# Patient Record
Sex: Female | Born: 1995 | Race: White | Hispanic: No | Marital: Married | Smoking: Never smoker
Health system: Southern US, Community
[De-identification: ages and names within clinical notes are randomized; demographics above are authoritative.]

## PROBLEM LIST (undated history)

## (undated) ENCOUNTER — Inpatient Hospital Stay (HOSPITAL_COMMUNITY): Payer: Self-pay

## (undated) VITALS — BP 114/74 | HR 121 | Temp 98.2°F | Resp 16 | Ht 68.31 in | Wt 134.5 lb

## (undated) DIAGNOSIS — G43909 Migraine, unspecified, not intractable, without status migrainosus: Secondary | ICD-10-CM

## (undated) DIAGNOSIS — L309 Dermatitis, unspecified: Secondary | ICD-10-CM

## (undated) DIAGNOSIS — E039 Hypothyroidism, unspecified: Secondary | ICD-10-CM

## (undated) DIAGNOSIS — K811 Chronic cholecystitis: Secondary | ICD-10-CM

## (undated) DIAGNOSIS — N3010 Interstitial cystitis (chronic) without hematuria: Secondary | ICD-10-CM

## (undated) DIAGNOSIS — K589 Irritable bowel syndrome without diarrhea: Secondary | ICD-10-CM

## (undated) DIAGNOSIS — K219 Gastro-esophageal reflux disease without esophagitis: Secondary | ICD-10-CM

## (undated) DIAGNOSIS — R1310 Dysphagia, unspecified: Secondary | ICD-10-CM

## (undated) DIAGNOSIS — Z98811 Dental restoration status: Secondary | ICD-10-CM

## (undated) DIAGNOSIS — J3089 Other allergic rhinitis: Secondary | ICD-10-CM

## (undated) DIAGNOSIS — R198 Other specified symptoms and signs involving the digestive system and abdomen: Secondary | ICD-10-CM

## (undated) HISTORY — DX: Migraine, unspecified, not intractable, without status migrainosus: G43.909

## (undated) HISTORY — DX: Irritable bowel syndrome, unspecified: K58.9

## (undated) HISTORY — PX: WISDOM TOOTH EXTRACTION: SHX21

## (undated) HISTORY — DX: Hypothyroidism, unspecified: E03.9

## (undated) HISTORY — DX: Dermatitis, unspecified: L30.9

---
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
## 2016-03-04 ENCOUNTER — Ambulatory Visit (INDEPENDENT_AMBULATORY_CARE_PROVIDER_SITE_OTHER): Payer: 59 | Admitting: Psychology

## 2016-03-04 DIAGNOSIS — F411 Generalized anxiety disorder: Secondary | ICD-10-CM | POA: Diagnosis not present

## 2016-03-06 ENCOUNTER — Encounter (HOSPITAL_COMMUNITY): Payer: Self-pay

## 2016-03-06 ENCOUNTER — Emergency Department (HOSPITAL_COMMUNITY)
Admission: EM | Admit: 2016-03-06 | Discharge: 2016-03-06 | Disposition: A | Discharge status: Home / Self Care | Payer: Commercial Managed Care - HMO | Attending: Emergency Medicine | Admitting: Emergency Medicine

## 2016-03-06 DIAGNOSIS — Z8742 Personal history of other diseases of the female genital tract: Secondary | ICD-10-CM | POA: Insufficient documentation

## 2016-03-06 DIAGNOSIS — J45909 Unspecified asthma, uncomplicated: Secondary | ICD-10-CM | POA: Insufficient documentation

## 2016-03-06 DIAGNOSIS — Z793 Long term (current) use of hormonal contraceptives: Secondary | ICD-10-CM | POA: Diagnosis not present

## 2016-03-06 DIAGNOSIS — R1031 Right lower quadrant pain: Secondary | ICD-10-CM | POA: Diagnosis not present

## 2016-03-06 DIAGNOSIS — G8929 Other chronic pain: Secondary | ICD-10-CM | POA: Insufficient documentation

## 2016-03-06 DIAGNOSIS — Z3202 Encounter for pregnancy test, result negative: Secondary | ICD-10-CM | POA: Insufficient documentation

## 2016-03-06 DIAGNOSIS — R109 Unspecified abdominal pain: Secondary | ICD-10-CM

## 2016-03-06 DIAGNOSIS — Z8669 Personal history of other diseases of the nervous system and sense organs: Secondary | ICD-10-CM | POA: Diagnosis not present

## 2016-03-06 DIAGNOSIS — R1011 Right upper quadrant pain: Secondary | ICD-10-CM | POA: Diagnosis present

## 2016-03-06 DIAGNOSIS — Z8659 Personal history of other mental and behavioral disorders: Secondary | ICD-10-CM | POA: Diagnosis not present

## 2016-03-06 DIAGNOSIS — Z872 Personal history of diseases of the skin and subcutaneous tissue: Secondary | ICD-10-CM | POA: Diagnosis not present

## 2016-03-06 LAB — COMPREHENSIVE METABOLIC PANEL
ALBUMIN: 3.7 g/dL (ref 3.5–5.0)
ALK PHOS: 65 U/L (ref 38–126)
ALT: 18 U/L (ref 14–54)
ANION GAP: 8 (ref 5–15)
AST: 17 U/L (ref 15–41)
BUN: 6 mg/dL (ref 6–20)
CALCIUM: 9.4 mg/dL (ref 8.9–10.3)
CHLORIDE: 108 mmol/L (ref 101–111)
CO2: 23 mmol/L (ref 22–32)
Creatinine, Ser: 0.72 mg/dL (ref 0.44–1.00)
GFR calc non Af Amer: >60 mL/min (ref >60)
GLUCOSE: 114 mg/dL — AB (ref 65–99)
Potassium: 4.1 mmol/L (ref 3.5–5.1)
SODIUM: 139 mmol/L (ref 135–145)
Total Bilirubin: 0.4 mg/dL (ref 0.3–1.2)
Total Protein: 6.5 g/dL (ref 6.5–8.1)

## 2016-03-06 LAB — CBC
HEMATOCRIT: 38.9 % (ref 36.0–46.0)
HEMOGLOBIN: 12.6 g/dL (ref 12.0–15.0)
MCH: 29.7 pg (ref 26.0–34.0)
MCHC: 32.4 g/dL (ref 30.0–36.0)
MCV: 91.7 fL (ref 78.0–100.0)
Platelets: 245 10*3/uL (ref 150–400)
RBC: 4.24 MIL/uL (ref 3.87–5.11)
RDW: 13.1 % (ref 11.5–15.5)
WBC: 5.2 10*3/uL (ref 4.0–10.5)

## 2016-03-06 LAB — URINALYSIS, ROUTINE W REFLEX MICROSCOPIC
BILIRUBIN URINE: NEGATIVE
GLUCOSE, UA: NEGATIVE mg/dL
HGB URINE DIPSTICK: NEGATIVE
Ketones, ur: NEGATIVE mg/dL
Leukocytes, UA: NEGATIVE
Nitrite: NEGATIVE
PH: 6.5 (ref 5.0–8.0)
Protein, ur: NEGATIVE mg/dL
SPECIFIC GRAVITY, URINE: 1.024 (ref 1.005–1.030)

## 2016-03-06 LAB — I-STAT BETA HCG BLOOD, ED (MC, WL, AP ONLY)

## 2016-03-06 LAB — LIPASE, BLOOD: LIPASE: 27 U/L (ref 11–51)

## 2016-03-06 MED ORDER — PANTOPRAZOLE SODIUM 40 MG IV SOLR
40.0000 mg | Freq: Once | INTRAVENOUS | Status: AC
  Administered 2016-03-06: 40 mg via INTRAVENOUS
  Filled 2016-03-06: qty 40

## 2016-03-06 MED ORDER — SODIUM CHLORIDE 0.9 % IV BOLUS (SEPSIS)
1000.0000 mL | Freq: Once | INTRAVENOUS | Status: AC
  Administered 2016-03-06: 1000 mL via INTRAVENOUS

## 2016-03-06 MED ORDER — GI COCKTAIL ~~LOC~~
30.0000 mL | Freq: Once | ORAL | Status: AC
  Administered 2016-03-06: 30 mL via ORAL
  Filled 2016-03-06: qty 30

## 2016-03-06 MED ORDER — KETOROLAC TROMETHAMINE 30 MG/ML IJ SOLN
30.0000 mg | Freq: Once | INTRAMUSCULAR | Status: AC
  Administered 2016-03-06: 30 mg via INTRAVENOUS
  Filled 2016-03-06: qty 1

## 2016-03-06 NOTE — ED Notes (Signed)
Onset past 12 hours constant pain in RUQ and across lower abd.  Pt has had this pain x 1 year, since Jan 2017 pain has been everyday, usually lasting 2 hours.  Pt has been treated for PCOS, PID, put on birth control, next will be treated for IC.  No vomiting, diarrhea, fever.  No one in household sick.

## 2016-03-06 NOTE — Discharge Instructions (Signed)
[name],  Nice meeting you! Please follow-up with your gynecologist and gastroenterologist. Return to the emergency department if your abdominal pain worsens, you are unable to keep foods down, you develop fevers/chills. Feel better soon!  S. Wendie Simmer, PA-C   Abdominal Pain, Adult Many things can cause belly (abdominal) pain. Most times, the belly pain is not dangerous. Many cases of belly pain can be watched and treated at home. HOME CARE   Do not take medicines that help you go poop (laxatives) unless told to by your doctor.  Only take medicine as told by your doctor.  Eat or drink as told by your doctor. Your doctor will tell you if you should be on a special diet. GET HELP IF:  You do not know what is causing your belly pain.  You have belly pain while you are sick to your stomach (nauseous) or have runny poop (diarrhea).  You have pain while you pee or poop.  Your belly pain wakes you up at night.  You have belly pain that gets worse or better when you eat.  You have belly pain that gets worse when you eat fatty foods.  You have a fever. GET HELP RIGHT AWAY IF:   The pain does not go away within 2 hours.  You keep throwing up (vomiting).  The pain changes and is only in the right or left part of the belly.  You have bloody or tarry looking poop. MAKE SURE YOU:   Understand these instructions.  Will watch your condition.  Will get help right away if you are not doing well or get worse.   This information is not intended to replace advice given to you by your health care provider. Make sure you discuss any questions you have with your health care provider.   Document Released: 05/16/2008 Document Revised: 12/19/2014 Document Reviewed: 08/07/2013 Elsevier Interactive Patient Education [employer].

## 2016-03-06 NOTE — ED Provider Notes (Signed)
CSN: [id number]     Arrival date & time 03/06/16  1055 History   First MD Initiated Contact with Patient 03/06/16 1206     Chief Complaint  Patient presents with  . Abdominal Pain   HPI Victoria Carson is a 20 y.o. female PMH significant for right ovarian cyst, depression, anxiety presenting with a 12 hour history of abdominal pain. She describes the pain as chronic in nature, right upper quadrant and right lower quadrant, unchanged from previous abdominal pain, 8 out of 10 pain scale, sharp, constant. She states she has been seen by her gynecologist for this pain and they have done extensive workup including evaluation for PCOS, PID and now interstitial cystitis. She was on oxycodone for pain control, and was taken off and told to take Tylenol PRN. She has tried Tylenol for this pain without relief. She denies fevers, chills, chest pain, shortness of breath, nausea, vomiting, changes in bowel or bladder habits, vaginal or urinary complaints, ill contacts, recent antibiotic use, recent travel.  Past Medical History  Diagnosis Date  . H/O guttate psoriasis     associated with strep infection  . Allergy   . RAD (reactive airway disease)   . Ovarian cyst, right   . Depression   . Vision abnormalities   . Anxiety   . [id number])    Past Surgical History  Procedure Laterality Date  . Wisdom tooth extraction     Family History  Problem Relation Age of Onset  . Heart failure Other   . Hypertension Other   . Depression Paternal Aunt   . Anxiety disorder Paternal Aunt   . Mental illness Mother   . Drug abuse Mother    Social History  Substance Use Topics  . Smoking status: Passive Smoke Exposure - Never Smoker  . Smokeless tobacco: Never Used  . Alcohol Use: No   OB History    No data available     Review of Systems  Ten systems are reviewed and are negative for acute change except as noted in the HPI  Allergies  Citrus and Sulfa antibiotics  Home Medications   Prior  to Admission medications   Medication Sig Start Date End Date Taking? Authorizing Provider  cyclobenzaprine (FLEXERIL) 5 MG tablet Take 1 tablet (5 mg total) by mouth 3 (three) times daily as needed for muscle spasms. Patient not taking: Reported on 08/24/2015 07/26/15   Roselee Culver, MD  etonogestrel-ethinyl estradiol (NUVARING) 0.12-0.015 MG/24HR vaginal ring Place 1 each vaginally every 28 (twenty-eight) days. Insert vaginally and leave in place for 3 consecutive weeks, then remove for 1 week.    Historical Provider, MD  HYDROcodone-acetaminophen (NORCO) 5-325 MG per tablet Take 1 tablet by mouth every 6 (six) hours as needed for severe pain. Patient not taking: Reported on 07/26/2015 02/25/15   Mercedes Camprubi-Soms, PA-C  HYDROcodone-acetaminophen (NORCO/VICODIN) 5-325 MG per tablet Take 1-2 tablets by mouth every 6 (six) hours as needed. 08/26/15   Jennifer Piepenbrink, PA-C  ibuprofen (ADVIL,MOTRIN) 800 MG tablet Take 1 tablet (800 mg total) by mouth every 8 (eight) hours as needed for mild pain. Patient not taking: Reported on 08/25/2015 08/25/15   Delice Bison Ward, DO  naproxen sodium (ANAPROX DS) 550 MG tablet Take 1 tablet (550 mg total) by mouth 2 (two) times daily with a meal. Patient not taking: Reported on 08/24/2015 07/26/15 07/25/16  Roselee Culver, MD  ondansetron (ZOFRAN ODT) 4 MG disintegrating tablet Take 1 tablet (4 mg total) by mouth  every 8 (eight) hours as needed for nausea or vomiting. Patient not taking: Reported on 08/25/2015 08/25/15   Kristen N Ward, DO  ondansetron (ZOFRAN ODT) 4 MG disintegrating tablet Take 1 tablet (4 mg total) by mouth every 8 (eight) hours as needed for nausea or vomiting. 08/26/15   Jennifer Piepenbrink, PA-C  triamcinolone (NASACORT AQ) 55 MCG/ACT AERO nasal inhaler Place 2 sprays into the nose daily. Each nostril Patient not taking: Reported on 08/24/2015 07/26/15   Roselee Culver, MD   BP 109/62 mmHg  Pulse 64  Temp(Src) 98 F (36.7 C)  (Oral)  Resp 16  Ht 5\' 9"  (1.753 m)  Wt 61.508 kg  BMI 20.02 kg/m2  SpO2 100%  LMP 02/07/2016 Physical Exam  Constitutional: She appears well-developed and well-nourished. No distress.  Thin appearing  HENT:  Head: Normocephalic and atraumatic.  Mouth/Throat: Oropharynx is clear and moist. No oropharyngeal exudate.  Eyes: Conjunctivae are normal. Pupils are equal, round, and reactive to light. Right eye exhibits no discharge. Left eye exhibits no discharge. No scleral icterus.  Neck: No tracheal deviation present.  Cardiovascular: Normal rate, regular rhythm, normal heart sounds and intact distal pulses.  Exam reveals no gallop and no friction rub.   No murmur heard. Pulmonary/Chest: Effort normal and breath sounds normal. No respiratory distress. She has no wheezes. She has no rales. She exhibits no tenderness.  Abdominal: Soft. Bowel sounds are normal. She exhibits no distension and no mass. There is tenderness. There is no rebound and no guarding.  Mild right upper and right lower quadrant tenderness. Negative McBurney, Psoas, Rovsing.  Musculoskeletal: She exhibits no edema.  Lymphadenopathy:    She has no cervical adenopathy.  Neurological: She is alert. Coordination normal.  Skin: Skin is warm and dry. No rash noted. She is not diaphoretic. No erythema.  Psychiatric: She has a normal mood and affect. Her behavior is normal.  Nursing note and vitals reviewed.   ED Course  Procedures  Labs Review Labs Reviewed  CBC  LIPASE, BLOOD  COMPREHENSIVE METABOLIC PANEL  URINALYSIS, ROUTINE W REFLEX MICROSCOPIC (NOT AT [facility])  I-STAT BETA HCG BLOOD, ED ([facility], [facility], [facility] ONLY)   MDM   Final diagnoses:  Abdominal pain, unspecified abdominal location   Patient nontoxic-appearing, vital signs stable. HCG, UA, lipase, CBC, CMP unremarkable. Will give GI cocktail and Protonix. This is a chronic issue. Patient has had extensive workup through her gynecologist. CBC, hcg, CMP, lipase, UA  unremarkable. Patient requesting gastroenterology referral for another opinion.  Patient may be safely discharged home. Discussed reasons for return. Patient to follow-up with gynecology within one week. Patient in understanding and agreement with the plan.  Silerton Lions, PA-C 03/10/16 [place], MD 03/12/16 [phone]

## 2016-03-18 ENCOUNTER — Ambulatory Visit: Payer: 59 | Admitting: Psychology

## 2016-04-13 ENCOUNTER — Ambulatory Visit (INDEPENDENT_AMBULATORY_CARE_PROVIDER_SITE_OTHER): Payer: 59 | Admitting: Psychology

## 2016-04-13 DIAGNOSIS — F411 Generalized anxiety disorder: Secondary | ICD-10-CM

## 2016-04-14 ENCOUNTER — Other Ambulatory Visit (HOSPITAL_COMMUNITY): Payer: Self-pay | Admitting: Gastroenterology

## 2016-04-14 DIAGNOSIS — R1011 Right upper quadrant pain: Secondary | ICD-10-CM

## 2016-04-14 DIAGNOSIS — R1013 Epigastric pain: Secondary | ICD-10-CM

## 2016-04-20 ENCOUNTER — Ambulatory Visit (HOSPITAL_COMMUNITY)
Admission: RE | Admit: 2016-04-20 | Discharge: 2016-04-20 | Disposition: A | Discharge status: Home / Self Care | Payer: Commercial Managed Care - HMO | Source: Ambulatory Visit | Attending: Gastroenterology | Admitting: Gastroenterology

## 2016-04-20 DIAGNOSIS — R1013 Epigastric pain: Secondary | ICD-10-CM | POA: Diagnosis present

## 2016-04-20 DIAGNOSIS — R1011 Right upper quadrant pain: Secondary | ICD-10-CM | POA: Insufficient documentation

## 2016-04-20 MED ORDER — TECHNETIUM TC 99M MEBROFENIN IV KIT
5.2000 | PACK | Freq: Once | INTRAVENOUS | Status: AC | PRN
  Administered 2016-04-20: 5 via INTRAVENOUS

## 2016-05-03 ENCOUNTER — Ambulatory Visit (INDEPENDENT_AMBULATORY_CARE_PROVIDER_SITE_OTHER): Payer: 59 | Admitting: Psychology

## 2016-05-03 DIAGNOSIS — F411 Generalized anxiety disorder: Secondary | ICD-10-CM

## 2016-05-18 ENCOUNTER — Ambulatory Visit (INDEPENDENT_AMBULATORY_CARE_PROVIDER_SITE_OTHER): Payer: 59 | Admitting: Psychology

## 2016-05-18 DIAGNOSIS — F411 Generalized anxiety disorder: Secondary | ICD-10-CM

## 2016-06-02 ENCOUNTER — Ambulatory Visit (INDEPENDENT_AMBULATORY_CARE_PROVIDER_SITE_OTHER): Payer: 59 | Admitting: Psychology

## 2016-06-02 DIAGNOSIS — F410 Panic disorder [episodic paroxysmal anxiety] without agoraphobia: Secondary | ICD-10-CM | POA: Diagnosis not present

## 2016-06-02 DIAGNOSIS — F332 Major depressive disorder, recurrent severe without psychotic features: Secondary | ICD-10-CM

## 2016-06-21 ENCOUNTER — Ambulatory Visit (INDEPENDENT_AMBULATORY_CARE_PROVIDER_SITE_OTHER): Payer: 59 | Admitting: Psychology

## 2016-06-21 DIAGNOSIS — F411 Generalized anxiety disorder: Secondary | ICD-10-CM

## 2016-06-27 ENCOUNTER — Other Ambulatory Visit: Payer: Self-pay | Admitting: Surgery

## 2016-06-30 ENCOUNTER — Ambulatory Visit (INDEPENDENT_AMBULATORY_CARE_PROVIDER_SITE_OTHER): Payer: 59 | Admitting: Psychology

## 2016-06-30 DIAGNOSIS — F410 Panic disorder [episodic paroxysmal anxiety] without agoraphobia: Secondary | ICD-10-CM | POA: Diagnosis not present

## 2016-07-12 DIAGNOSIS — K811 Chronic cholecystitis: Secondary | ICD-10-CM

## 2016-07-12 HISTORY — DX: Chronic cholecystitis: K81.1

## 2016-08-01 ENCOUNTER — Encounter (HOSPITAL_BASED_OUTPATIENT_CLINIC_OR_DEPARTMENT_OTHER): Payer: Self-pay | Admitting: *Deleted

## 2016-08-03 ENCOUNTER — Ambulatory Visit (INDEPENDENT_AMBULATORY_CARE_PROVIDER_SITE_OTHER): Payer: Commercial Managed Care - HMO | Admitting: Psychology

## 2016-08-03 DIAGNOSIS — F411 Generalized anxiety disorder: Secondary | ICD-10-CM

## 2016-08-07 NOTE — H&P (Signed)
Victoria Carson  Location: [facility] Patient #: [id number] DOB: September 15, 1996 Single / Language: Cleophus Molt / Race: White Female   History of Present Illness (Victoria [place] A. Ninfa Linden MD;  Patient words: Gallbladder.  The patient is a 20 year old female who presents with abdominal pain. This is a pleasant 20 year old female referred to me by Dr. Arta Silence for right upper quadrant abdominal pain. This is been going on since September 2016. She started having right upper quadrant abdominal pain hurting through to the back with nausea and bloating after fatty meals. This is now occurring daily. She has had a normal upper endoscopy. She has had a normal CT scan. Ultrasound showed a contracted gallbladder but no stones. HIDA scan showed a normal gallbladder ejection fraction. She has had no previous similar history. Bowel movements are normal. She is otherwise healthy and without complaints. The pain is described as moderate to severe.   Other Problems  Anxiety Disorder Depression Gastroesophageal Reflux Disease  Past Surgical History Malachi Bonds, CMA Oral Surgery  Diagnostic Studies History Malachi Bonds, CMA;  Colonoscopy never Mammogram never Pap Smear 1-5 years ago  Allergies Malachi Bonds, CMA;  No Known Drug Allergies07/17/2017  Medication History Malachi Bonds, CMA; NuvaRing (0.12-0.015MG /24HR Ring, Vaginal) Active. Sucralfate (1GM Tablet, Oral) Active. Tinidazole (500MG  Tablet, Oral) Active. Uro-MP (118MG  Capsule, Oral) Active. Medications Reconciled  Social History Malachi Bonds, CMA;  AM) Caffeine use Coffee. No alcohol use No drug use Tobacco use Never smoker.  Family History Malachi Bonds, CMA;  Bleeding disorder Mother. Depression Brother, Mother, Sister.  Pregnancy / Birth History Malachi Bonds, CMA;  Age at menarche 61 years. Gravida 0 Para 0 Regular periods    Review of Systems   General Present-  Appetite Loss, Fatigue and Weight Loss. Not Present- Chills, Fever, Night Sweats and Weight Gain. HEENT Present- Seasonal Allergies and Wears glasses/contact lenses. Not Present- Earache, Hearing Loss, Hoarseness, Nose Bleed, Oral Ulcers, Ringing in the Ears, Sinus Pain, Sore Throat, Visual Disturbances and Yellow Eyes. Gastrointestinal Present- Abdominal Pain, Constipation, Gets full quickly at meals and Nausea. Not Present- Bloating, Bloody Stool, Change in Bowel Habits, Chronic diarrhea, Difficulty Swallowing, Excessive gas, Hemorrhoids, Indigestion, Rectal Pain and Vomiting. Female Genitourinary Present- Pelvic Pain and Urgency. Not Present- Frequency, Nocturia and Painful Urination. Neurological Present- Headaches. Not Present- Decreased Memory, Fainting, Numbness, Seizures, Tingling, Tremor, Trouble walking and Weakness. Psychiatric Present- Anxiety and Change in Sleep Pattern. Not Present- Bipolar, Depression, Fearful and Frequent crying.  Vitals   Weight: 133.3 lb (62nd percentile) Height: 69in (97th percentile) Body Surface Area: 1.74 m Body Mass Index: 19.68 kg/m  (25th percentile)  Temp.: 98.39F  BP: 102/54 (Sitting, Left Arm, Standard)  Percentiles calculated using CDC data for children 2-20 years.     Physical Exam   General Mental Status-Alert. General Appearance-Consistent with stated age. Hydration-Well hydrated. Voice-Normal.  Head and Neck Head-normocephalic, atraumatic with no lesions or palpable masses.  Eye Eyeball - Bilateral-Extraocular movements intact. Sclera/Conjunctiva - Bilateral-No scleral icterus.  Chest and Lung Exam Chest and lung exam reveals -quiet, even and easy respiratory effort with no use of accessory muscles and on auscultation, normal breath sounds, no adventitious sounds and normal vocal resonance. Inspection Chest Wall - Normal. Back - normal.  Cardiovascular Cardiovascular examination reveals -on  palpation PMI is normal in location and amplitude, no palpable S3 or S4. Normal cardiac borders., normal heart sounds, regular rate and rhythm with no murmurs, carotid auscultation reveals no bruits and normal pedal pulses bilaterally.  Abdomen  Inspection Inspection of the abdomen reveals - No Hernias. Skin - Scar - no surgical scars. Palpation/Percussion Palpation and Percussion of the abdomen reveal - Soft, Non Tender, No Rebound tenderness, No Rigidity (guarding) and No hepatosplenomegaly. Auscultation Auscultation of the abdomen reveals - Bowel sounds normal.  Neurologic - Did not examine.  Musculoskeletal - Did not examine.    Assessment & Plan    CHRONIC CHOLECYSTITIS (K81.1) Impression: Based on her symptoms and ultrasound, I believe she has chronic cholecystitis and would benefit from a laparoscopic cholecystectomy. I discussed this with the patient and her father in detail. I gave him literature regarding cholecystectomy. I discussed the risks of surgery which includes but is not limited to bleeding, infection, the need to convert to an open procedure, injury to surrounding structures, bile duct injury, bile leak, the chance this may not resolve any of her symptoms, postoperative recovery, etc. I also discussed continued conservative management. After discussion, they wish to proceed with cholecystectomy which will be scheduled Current Plans Pt Education - Pamphlet Given - Laparoscopic Gallbladder Surgery: discussed with patient and provided information.

## 2016-08-08 ENCOUNTER — Encounter (HOSPITAL_BASED_OUTPATIENT_CLINIC_OR_DEPARTMENT_OTHER)
Admission: RE | Disposition: A | Discharge status: Home / Self Care | Payer: Self-pay | Source: Ambulatory Visit | Attending: Surgery

## 2016-08-08 ENCOUNTER — Ambulatory Visit (HOSPITAL_BASED_OUTPATIENT_CLINIC_OR_DEPARTMENT_OTHER): Payer: Commercial Managed Care - HMO | Admitting: Anesthesiology

## 2016-08-08 ENCOUNTER — Ambulatory Visit (HOSPITAL_BASED_OUTPATIENT_CLINIC_OR_DEPARTMENT_OTHER)
Admission: RE | Admit: 2016-08-08 | Discharge: 2016-08-08 | Disposition: A | Discharge status: Home / Self Care | Payer: Commercial Managed Care - HMO | Source: Ambulatory Visit | Attending: Surgery | Admitting: Surgery

## 2016-08-08 ENCOUNTER — Encounter (HOSPITAL_BASED_OUTPATIENT_CLINIC_OR_DEPARTMENT_OTHER): Payer: Self-pay

## 2016-08-08 DIAGNOSIS — K811 Chronic cholecystitis: Secondary | ICD-10-CM | POA: Insufficient documentation

## 2016-08-08 DIAGNOSIS — Z793 Long term (current) use of hormonal contraceptives: Secondary | ICD-10-CM | POA: Diagnosis not present

## 2016-08-08 HISTORY — DX: Other specified symptoms and signs involving the digestive system and abdomen: R19.8

## 2016-08-08 HISTORY — PX: CHOLECYSTECTOMY: SHX55

## 2016-08-08 HISTORY — DX: Chronic cholecystitis: K81.1

## 2016-08-08 HISTORY — DX: Gastro-esophageal reflux disease without esophagitis: K21.9

## 2016-08-08 HISTORY — DX: Dysphagia, unspecified: R13.10

## 2016-08-08 HISTORY — DX: Dental restoration status: Z98.811

## 2016-08-08 HISTORY — DX: Other allergic rhinitis: J30.89

## 2016-08-08 SURGERY — LAPAROSCOPIC CHOLECYSTECTOMY
Anesthesia: General | Site: Abdomen

## 2016-08-08 MED ORDER — GLYCOPYRROLATE 0.2 MG/ML IJ SOLN
0.2000 mg | Freq: Once | INTRAMUSCULAR | Status: AC | PRN
  Administered 2016-08-08: 0.2 mg via INTRAVENOUS

## 2016-08-08 MED ORDER — SUCCINYLCHOLINE CHLORIDE 20 MG/ML IJ SOLN
INTRAMUSCULAR | Status: DC | PRN
  Administered 2016-08-08: 50 mg via INTRAVENOUS

## 2016-08-08 MED ORDER — FENTANYL CITRATE (PF) 100 MCG/2ML IJ SOLN
INTRAMUSCULAR | Status: AC
  Filled 2016-08-08: qty 2

## 2016-08-08 MED ORDER — DIPHENHYDRAMINE HCL 50 MG/ML IJ SOLN
INTRAMUSCULAR | Status: DC | PRN
  Administered 2016-08-08 (×2): 25 mg via INTRAVENOUS

## 2016-08-08 MED ORDER — OXYCODONE HCL 5 MG PO TABS: 5.0000 mg | ORAL_TABLET | ORAL | Status: DC | PRN

## 2016-08-08 MED ORDER — CHLORHEXIDINE GLUCONATE CLOTH 2 % EX PADS: 6.0000 | MEDICATED_PAD | Freq: Once | CUTANEOUS | Status: DC

## 2016-08-08 MED ORDER — KETOROLAC TROMETHAMINE 30 MG/ML IJ SOLN
INTRAMUSCULAR | Status: AC
  Filled 2016-08-08: qty 1

## 2016-08-08 MED ORDER — BUPIVACAINE-EPINEPHRINE (PF) 0.5% -1:200000 IJ SOLN
INTRAMUSCULAR | Status: AC
  Filled 2016-08-08: qty 30

## 2016-08-08 MED ORDER — MORPHINE SULFATE (PF) 2 MG/ML IV SOLN: 1.0000 mg | INTRAVENOUS | Status: DC | PRN

## 2016-08-08 MED ORDER — SUCCINYLCHOLINE CHLORIDE 200 MG/10ML IV SOSY
PREFILLED_SYRINGE | INTRAVENOUS | Status: AC
  Filled 2016-08-08: qty 10

## 2016-08-08 MED ORDER — SODIUM CHLORIDE 0.9 % IV SOLN: 250.0000 mL | INTRAVENOUS | Status: DC | PRN

## 2016-08-08 MED ORDER — LIDOCAINE HCL (CARDIAC) 20 MG/ML IV SOLN
INTRAVENOUS | Status: DC | PRN
  Administered 2016-08-08: 50 mg via INTRAVENOUS

## 2016-08-08 MED ORDER — OXYCODONE HCL 5 MG/5ML PO SOLN
ORAL | Status: AC
  Filled 2016-08-08: qty 5

## 2016-08-08 MED ORDER — ACETAMINOPHEN 650 MG RE SUPP: 650.0000 mg | RECTAL | Status: DC | PRN

## 2016-08-08 MED ORDER — FENTANYL CITRATE (PF) 100 MCG/2ML IJ SOLN
INTRAMUSCULAR | Status: DC | PRN
  Administered 2016-08-08: 50 ug via INTRAVENOUS
  Administered 2016-08-08: 100 ug via INTRAVENOUS
  Administered 2016-08-08: 50 ug via INTRAVENOUS

## 2016-08-08 MED ORDER — HYDROMORPHONE HCL 1 MG/ML IJ SOLN
INTRAMUSCULAR | Status: AC
  Filled 2016-08-08: qty 1

## 2016-08-08 MED ORDER — PROPOFOL 500 MG/50ML IV EMUL
INTRAVENOUS | Status: AC
  Filled 2016-08-08: qty 50

## 2016-08-08 MED ORDER — LIDOCAINE 2% (20 MG/ML) 5 ML SYRINGE
INTRAMUSCULAR | Status: AC
  Filled 2016-08-08: qty 5

## 2016-08-08 MED ORDER — DEXAMETHASONE SODIUM PHOSPHATE 4 MG/ML IJ SOLN
INTRAMUSCULAR | Status: DC | PRN
  Administered 2016-08-08: 10 mg via INTRAVENOUS

## 2016-08-08 MED ORDER — ACETAMINOPHEN 325 MG PO TABS: 325.0000 mg | ORAL_TABLET | ORAL | Status: DC | PRN

## 2016-08-08 MED ORDER — OXYCODONE HCL 5 MG PO TABS
ORAL_TABLET | ORAL | Status: AC
  Filled 2016-08-08: qty 1

## 2016-08-08 MED ORDER — SODIUM CHLORIDE 0.9 % IR SOLN
Status: DC | PRN
  Administered 2016-08-08: 1000 mL

## 2016-08-08 MED ORDER — SODIUM CHLORIDE 0.9% FLUSH: 3.0000 mL | INTRAVENOUS | Status: DC | PRN

## 2016-08-08 MED ORDER — FENTANYL CITRATE (PF) 100 MCG/2ML IJ SOLN
25.0000 ug | INTRAMUSCULAR | Status: DC | PRN
  Administered 2016-08-08: 25 ug via INTRAVENOUS
  Administered 2016-08-08: 50 ug via INTRAVENOUS

## 2016-08-08 MED ORDER — KETOROLAC TROMETHAMINE 30 MG/ML IJ SOLN
INTRAMUSCULAR | Status: DC | PRN
  Administered 2016-08-08: 30 mg via INTRAVENOUS

## 2016-08-08 MED ORDER — OXYCODONE HCL 5 MG PO TABS: 5.0000 mg | ORAL_TABLET | Freq: Once | ORAL | Status: AC | PRN

## 2016-08-08 MED ORDER — BUPIVACAINE-EPINEPHRINE (PF) 0.5% -1:200000 IJ SOLN
INTRAMUSCULAR | Status: DC | PRN
  Administered 2016-08-08: 20 mL via PERINEURAL

## 2016-08-08 MED ORDER — HYDROMORPHONE HCL 1 MG/ML IJ SOLN
0.5000 mg | INTRAMUSCULAR | Status: DC | PRN
  Administered 2016-08-08: 0.25 mg via INTRAVENOUS

## 2016-08-08 MED ORDER — ONDANSETRON HCL 4 MG/2ML IJ SOLN
INTRAMUSCULAR | Status: AC
  Filled 2016-08-08: qty 2

## 2016-08-08 MED ORDER — MIDAZOLAM HCL 2 MG/2ML IJ SOLN: 1.0000 mg | INTRAMUSCULAR | Status: DC | PRN

## 2016-08-08 MED ORDER — CEFAZOLIN SODIUM-DEXTROSE 2-4 GM/100ML-% IV SOLN
2.0000 g | INTRAVENOUS | Status: AC
  Administered 2016-08-08: 2 g via INTRAVENOUS

## 2016-08-08 MED ORDER — HYDROCODONE-ACETAMINOPHEN 7.5-325 MG/15ML PO SOLN: 15.0000 mL | ORAL | 0 refills | Status: AC | PRN

## 2016-08-08 MED ORDER — DEXAMETHASONE SODIUM PHOSPHATE 10 MG/ML IJ SOLN
INTRAMUSCULAR | Status: AC
  Filled 2016-08-08: qty 1

## 2016-08-08 MED ORDER — OXYCODONE HCL 5 MG/5ML PO SOLN
5.0000 mg | Freq: Once | ORAL | Status: AC | PRN
  Administered 2016-08-08: 5 mg via ORAL

## 2016-08-08 MED ORDER — FENTANYL CITRATE (PF) 100 MCG/2ML IJ SOLN: 50.0000 ug | INTRAMUSCULAR | Status: DC | PRN

## 2016-08-08 MED ORDER — MIDAZOLAM HCL 2 MG/2ML IJ SOLN
INTRAMUSCULAR | Status: AC
  Filled 2016-08-08: qty 2

## 2016-08-08 MED ORDER — MIDAZOLAM HCL 5 MG/5ML IJ SOLN
INTRAMUSCULAR | Status: DC | PRN
  Administered 2016-08-08: 2 mg via INTRAVENOUS

## 2016-08-08 MED ORDER — SCOPOLAMINE 1 MG/3DAYS TD PT72: 1.0000 | MEDICATED_PATCH | Freq: Once | TRANSDERMAL | Status: DC | PRN

## 2016-08-08 MED ORDER — ACETAMINOPHEN 325 MG PO TABS: 650.0000 mg | ORAL_TABLET | ORAL | Status: DC | PRN

## 2016-08-08 MED ORDER — CEFAZOLIN SODIUM-DEXTROSE 2-4 GM/100ML-% IV SOLN
INTRAVENOUS | Status: AC
  Filled 2016-08-08: qty 100

## 2016-08-08 MED ORDER — ONDANSETRON HCL 4 MG/2ML IJ SOLN
INTRAMUSCULAR | Status: DC | PRN
  Administered 2016-08-08: 4 mg via INTRAVENOUS

## 2016-08-08 MED ORDER — LACTATED RINGERS IV SOLN
INTRAVENOUS | Status: DC
  Administered 2016-08-08 (×2): via INTRAVENOUS

## 2016-08-08 MED ORDER — ACETAMINOPHEN 160 MG/5ML PO SOLN: 325.0000 mg | ORAL | Status: DC | PRN

## 2016-08-08 MED ORDER — SODIUM CHLORIDE 0.9% FLUSH: 3.0000 mL | Freq: Two times a day (BID) | INTRAVENOUS | Status: DC

## 2016-08-08 MED ORDER — DIPHENHYDRAMINE HCL 50 MG/ML IJ SOLN
INTRAMUSCULAR | Status: AC
  Filled 2016-08-08: qty 1

## 2016-08-08 SURGICAL SUPPLY — 32 items
APPLIER CLIP 5 13 M/L LIGAMAX5 (MISCELLANEOUS) ×2
BLADE CLIPPER SURG (BLADE) IMPLANT
CHLORAPREP W/TINT 26ML (MISCELLANEOUS) ×2 IMPLANT
CLIP APPLIE 5 13 M/L LIGAMAX5 (MISCELLANEOUS) ×1 IMPLANT
COVER MAYO STAND STRL (DRAPES) IMPLANT
DECANTER SPIKE VIAL GLASS SM (MISCELLANEOUS) ×2 IMPLANT
DERMABOND ADVANCED (GAUZE/BANDAGES/DRESSINGS) ×1
DERMABOND ADVANCED .7 DNX12 (GAUZE/BANDAGES/DRESSINGS) ×1 IMPLANT
DRAPE C-ARM 42X72 X-RAY (DRAPES) IMPLANT
ELECT REM PT RETURN 9FT ADLT (ELECTROSURGICAL) ×2
ELECTRODE REM PT RTRN 9FT ADLT (ELECTROSURGICAL) ×1 IMPLANT
FILTER SMOKE EVAC LAPAROSHD (FILTER) IMPLANT
GLOVE SURG SIGNA 7.5 PF LTX (GLOVE) ×2 IMPLANT
GOWN STRL REUS W/ TWL LRG LVL3 (GOWN DISPOSABLE) ×3 IMPLANT
GOWN STRL REUS W/TWL LRG LVL3 (GOWN DISPOSABLE) ×3
LIQUID BAND (GAUZE/BANDAGES/DRESSINGS) ×4 IMPLANT
NS IRRIG 1000ML POUR BTL (IV SOLUTION) ×2 IMPLANT
PACK BASIN DAY SURGERY FS (CUSTOM PROCEDURE TRAY) ×2 IMPLANT
POUCH SPECIMEN RETRIEVAL 10MM (ENDOMECHANICALS) ×2 IMPLANT
SCISSORS LAP 5X35 DISP (ENDOMECHANICALS) IMPLANT
SET CHOLANGIOGRAPH 5 50 .035 (SET/KITS/TRAYS/PACK) IMPLANT
SET IRRIG TUBING LAPAROSCOPIC (IRRIGATION / IRRIGATOR) ×2 IMPLANT
SLEEVE ENDOPATH XCEL 5M (ENDOMECHANICALS) ×4 IMPLANT
SLEEVE SCD COMPRESS KNEE MED (MISCELLANEOUS) ×2 IMPLANT
SPECIMEN JAR SMALL (MISCELLANEOUS) ×2 IMPLANT
SUT MON AB 4-0 PC3 18 (SUTURE) ×2 IMPLANT
TOWEL OR 17X24 6PK STRL BLUE (TOWEL DISPOSABLE) ×2 IMPLANT
TRAY LAPAROSCOPIC (CUSTOM PROCEDURE TRAY) ×2 IMPLANT
TROCAR XCEL BLUNT TIP 100MML (ENDOMECHANICALS) ×2 IMPLANT
TROCAR XCEL NON-BLD 5MMX100MML (ENDOMECHANICALS) ×2 IMPLANT
TUBE CONNECTING 20X1/4 (TUBING) ×4 IMPLANT
TUBING INSUFFLATION (TUBING) ×2 IMPLANT

## 2016-08-08 NOTE — Op Note (Signed)

## 2016-08-08 NOTE — Anesthesia Postprocedure Evaluation (Signed)
Anesthesia Post Note  Patient: Victoria Carson  Procedure(s) Performed: Procedure(s) (LRB): LAPAROSCOPIC CHOLECYSTECTOMY (N/A)  Patient location during evaluation: PACU Anesthesia Type: General Level of consciousness: awake Pain management: pain level controlled Vital Signs Assessment: post-procedure vital signs reviewed and stable Respiratory status: spontaneous breathing Cardiovascular status: stable Postop Assessment: no signs of nausea or vomiting Anesthetic complications: no    Last Vitals:  Vitals:   08/08/16 0936 08/08/16 0945  BP:    Pulse:    Resp: 16 16  Temp: 36.5 C 36.5 C    Last Pain:  Vitals:   08/08/16 0945  TempSrc:   PainSc: 4                  [employer]

## 2016-08-08 NOTE — Transfer of Care (Signed)
Immediate Anesthesia Transfer of Care Note  Patient: Victoria Carson  Procedure(s) Performed: Procedure(s): LAPAROSCOPIC CHOLECYSTECTOMY (N/A)  Patient Location: PACU  Anesthesia Type:General  Level of Consciousness: awake and patient cooperative  Airway & Oxygen Therapy: Patient Spontanous Breathing and Patient connected to face mask oxygen  Post-op Assessment: Report given to RN and Post -op Vital signs reviewed and stable  Post vital signs: Reviewed and stable  Last Vitals:  Vitals:   08/08/16 0625  BP: 117/81  Pulse: 100  Resp: 20  Temp: 36.9 C    Last Pain:  Vitals:   08/08/16 0625  TempSrc: Oral         Complications: No apparent anesthesia complications

## 2016-08-08 NOTE — Anesthesia Preprocedure Evaluation (Signed)
Anesthesia Evaluation  Patient identified by MRN, date of birth, ID band Patient awake    Reviewed: Allergy & Precautions, NPO status , Patient's Chart, lab work & pertinent test results  History of Anesthesia Complications Negative for: history of anesthetic complications  Airway Mallampati: I  TM Distance: >3 FB Neck ROM: Full    Dental  (+) Teeth Intact,    Pulmonary neg pulmonary ROS,    breath sounds clear to auscultation       Cardiovascular negative cardio ROS   Rhythm:Regular     Neuro/Psych PSYCHIATRIC DISORDERS Depression negative neurological ROS     GI/Hepatic Neg liver ROS, GERD  Controlled,  Endo/Other  negative endocrine ROS  Renal/GU negative Renal ROS     Musculoskeletal   Abdominal   Peds  Hematology negative hematology ROS (+)   Anesthesia Other Findings   Reproductive/Obstetrics                             Anesthesia Physical Anesthesia Plan  ASA: I  Anesthesia Plan: General   Post-op Pain Management:    Induction: Intravenous  Airway Management Planned: Oral ETT  Additional Equipment: None  Intra-op Plan:   Post-operative Plan: Extubation in OR  Informed Consent: I have reviewed the patients History and Physical, chart, labs and discussed the procedure including the risks, benefits and alternatives for the proposed anesthesia with the patient or authorized representative who has indicated his/her understanding and acceptance.   Dental advisory given  Plan Discussed with: CRNA and Surgeon  Anesthesia Plan Comments:         Anesthesia Quick Evaluation

## 2016-08-08 NOTE — Anesthesia Procedure Notes (Signed)
Procedure Name: Intubation Date/Time: 08/08/2016 7:30 AM Performed by: Marrianne Mood Pre-anesthesia Checklist: Patient identified, Emergency Drugs available, Suction available, Patient being monitored and Timeout performed Patient Re-evaluated:Patient Re-evaluated prior to inductionOxygen Delivery Method: Circle system utilized Preoxygenation: Pre-oxygenation with 100% oxygen Intubation Type: IV induction Ventilation: Mask ventilation without difficulty Grade View: Grade II Tube type: Oral Tube size: 7.0 mm Number of attempts: 1 Airway Equipment and Method: Stylet and Oral airway Placement Confirmation: ETT inserted through vocal cords under direct vision,  positive ETCO2 and breath sounds checked- equal and bilateral Secured at: 21 cm Tube secured with: Tape Dental Injury: Teeth and Oropharynx as per pre-operative assessment

## 2016-08-08 NOTE — Interval H&P Note (Signed)
History and Physical Interval Note: no change in H and P  08/08/2016 6:42 AM  Victoria Carson  has presented today for surgery, with the diagnosis of Chronic cholecystitis  The various methods of treatment have been discussed with the patient and family. After consideration of risks, benefits and other options for treatment, the patient has consented to  Procedure(s): LAPAROSCOPIC CHOLECYSTECTOMY (N/A) as a surgical intervention .  The patient's history has been reviewed, patient examined, no change in status, stable for surgery.  I have reviewed the patient's chart and labs.  Questions were answered to the patient's satisfaction.     Senai Ramnath A

## 2016-08-08 NOTE — Discharge Instructions (Signed)
[facility]: POST OP INSTRUCTIONS Always review your discharge instruction sheet given to you by the facility where your surgery was performed. IF YOU HAVE DISABILITY OR FAMILY LEAVE FORMS, YOU MUST BRING THEM TO THE OFFICE FOR PROCESSING.   DO NOT GIVE THEM TO YOUR DOCTOR.  1. A prescription for pain medication may be given to you upon discharge.  Take your pain medication as prescribed, if needed.  If narcotic pain medicine is not needed, then you may take acetaminophen (Tylenol) or ibuprofen (Advil) as needed. 2. Take your usually prescribed medications unless otherwise directed. 3. If you need a refill on your pain medication, please contact your pharmacy.  They will contact our office to request authorization. Prescriptions will not be filled after 5pm or on week-ends. 4. You should follow a light diet the first few days after arrival home, such as soup and crackers, etc.  Be sure to include lots of fluids daily. 5. Most patients will experience some swelling and bruising in the area of the incisions.  Ice packs will help.  Swelling and bruising can take several days to resolve.  6. It is common to experience some constipation if taking pain medication after surgery.  Increasing fluid intake and taking a stool softener (such as Colace) will usually help or prevent this problem from occurring.  A mild laxative (Milk of Magnesia or Miralax) should be taken according to package instructions if there are no bowel movements after 48 hours. 7. Unless discharge instructions indicate otherwise, you may remove your bandages 24-48 hours after surgery, and you may shower at that time.  You may have steri-strips (small skin tapes) in place directly over the incision.  These strips should be left on the skin for 7-10 days.  If your surgeon used skin glue on the incision, you may shower in 24 hours.  The glue will flake off over the next 2-3 weeks.  Any sutures or  staples will be removed at the office during your follow-up visit. 8. ACTIVITIES:  You may resume regular (light) daily activities beginning the next day--such as daily self-care, walking, climbing stairs--gradually increasing activities as tolerated.  You may have sexual intercourse when it is comfortable.  Refrain from any heavy lifting or straining until approved by your doctor. a. You may drive when you are no longer taking prescription pain medication, you can comfortably wear a seatbelt, and you can safely maneuver your car and apply brakes. b. RETURN TO WORK:  __________________________________________________________ 9. You should see your doctor in the office for a follow-up appointment approximately 2-3 weeks after your surgery.  Make sure that you call for this appointment within a day or two after you arrive home to insure a convenient appointment time. 10. OTHER INSTRUCTIONS:ok to shower 11. No lifting more than 15 pounds for 2 weeks 12. Ice pack and ibuprofen also for pain __________________________________________________________________________________________________________________________ __________________________________________________________________________________________________________________________ WHEN TO CALL YOUR DOCTOR: 1. Fever over 101.0 2. Inability to urinate 3. Continued bleeding from incision. 4. Increased pain, redness, or drainage from the incision. 5. Increasing abdominal pain  The clinic staff is available to answer your questions during regular business hours.  Please dont hesitate to call and ask to speak to one of the nurses for clinical concerns.  If you have a medical emergency, go to the nearest emergency room or call 911.  A surgeon from [facility] is always on call at the hospital. [address] [place], [place], [place]  [id number] ? [address], [place], [place]   [phone] ? [phone] ? FAX [phone]  [phone] Web site: [email or web address]   Post Anesthesia Home Care Instructions  Activity: Get plenty of rest for the remainder of the day. A responsible adult should stay with you for 24 hours following the procedure.  For the next 24 hours, DO NOT: -Drive a car -Paediatric nurse -Drink alcoholic beverages -Take any medication unless instructed by your physician -Make any legal decisions or sign important papers.  Meals: Start with liquid foods such as gelatin or soup. Progress to regular foods as tolerated. Avoid greasy, spicy, heavy foods. If nausea and/or vomiting occur, drink only clear liquids until the nausea and/or vomiting subsides. Call your physician if vomiting continues.  Special Instructions/Symptoms: Your throat may feel dry or sore from the anesthesia or the breathing tube placed in your throat during surgery. If this causes discomfort, gargle with warm salt water. The discomfort should disappear within 24 hours.  If you had a scopolamine patch placed behind your ear for the management of post- operative nausea and/or vomiting:  1. The medication in the patch is effective for 72 hours, after which it should be removed.  Wrap patch in a tissue and discard in the trash. Wash hands thoroughly with soap and water. 2. You may remove the patch earlier than 72 hours if you experience unpleasant side effects which may include dry mouth, dizziness or visual disturbances. 3. Avoid touching the patch. Wash your hands with soap and water after contact with the patch.

## 2016-08-09 ENCOUNTER — Encounter (HOSPITAL_BASED_OUTPATIENT_CLINIC_OR_DEPARTMENT_OTHER): Payer: Self-pay | Admitting: Surgery

## 2016-08-24 ENCOUNTER — Ambulatory Visit: Payer: Commercial Managed Care - HMO | Admitting: Psychology

## 2016-09-06 ENCOUNTER — Ambulatory Visit (INDEPENDENT_AMBULATORY_CARE_PROVIDER_SITE_OTHER): Payer: 59 | Admitting: Psychology

## 2016-09-06 DIAGNOSIS — F410 Panic disorder [episodic paroxysmal anxiety] without agoraphobia: Secondary | ICD-10-CM | POA: Diagnosis not present

## 2016-09-10 ENCOUNTER — Ambulatory Visit (INDEPENDENT_AMBULATORY_CARE_PROVIDER_SITE_OTHER): Payer: Commercial Managed Care - HMO | Admitting: Family Medicine

## 2016-09-10 ENCOUNTER — Ambulatory Visit (INDEPENDENT_AMBULATORY_CARE_PROVIDER_SITE_OTHER): Payer: Commercial Managed Care - HMO

## 2016-09-10 VITALS — BP 104/60 | HR 68 | Temp 98.6°F | Resp 16 | Ht 69.0 in | Wt 123.0 lb

## 2016-09-10 DIAGNOSIS — R112 Nausea with vomiting, unspecified: Secondary | ICD-10-CM

## 2016-09-10 DIAGNOSIS — R197 Diarrhea, unspecified: Secondary | ICD-10-CM | POA: Diagnosis not present

## 2016-09-10 DIAGNOSIS — R1084 Generalized abdominal pain: Secondary | ICD-10-CM

## 2016-09-10 LAB — POCT URINALYSIS DIP (MANUAL ENTRY)
GLUCOSE UA: NEGATIVE
LEUKOCYTES UA: NEGATIVE
Nitrite, UA: NEGATIVE
Spec Grav, UA: 1.025
UROBILINOGEN UA: 0.2
pH, UA: 6

## 2016-09-10 LAB — POCT CBC
Granulocyte percent: 54.6 %G (ref 37–80)
HCT, POC: 38.8 % (ref 37.7–47.9)
HEMOGLOBIN: 14.2 g/dL (ref 12.2–16.2)
Lymph, poc: 1.9 (ref 0.6–3.4)
MCH: 31.3 pg — AB (ref 27–31.2)
MCHC: 36.6 g/dL — AB (ref 31.8–35.4)
MCV: 85.4 fL (ref 80–97)
MID (cbc): 0.8 (ref 0–0.9)
MPV: 8.8 fL (ref 0–99.8)
POC Granulocyte: 3.2 (ref 2–6.9)
POC LYMPH PERCENT: 32.1 %L (ref 10–50)
POC MID %: 13.3 % — AB (ref 0–12)
Platelet Count, POC: 239 10*3/uL (ref 142–424)
RBC: 4.54 M/uL (ref 4.04–5.48)
RDW, POC: 13 %
WBC: 5.9 10*3/uL (ref 4.6–10.2)

## 2016-09-10 LAB — POC MICROSCOPIC URINALYSIS (UMFC)

## 2016-09-10 LAB — POCT URINE PREGNANCY: PREG TEST UR: NEGATIVE

## 2016-09-10 MED ORDER — DIPHENOXYLATE-ATROPINE 2.5-0.025 MG PO TABS: 1.0000 | ORAL_TABLET | Freq: Four times a day (QID) | ORAL | 0 refills | Status: DC | PRN

## 2016-09-10 MED ORDER — ONDANSETRON 8 MG PO TBDP: 8.0000 mg | ORAL_TABLET | Freq: Three times a day (TID) | ORAL | 0 refills | Status: DC | PRN

## 2016-09-10 NOTE — Progress Notes (Signed)
Patient ID: Victoria Carson, female    DOB: 11/07/1996, 20 y.o.   MRN: [id number]  PCP: No PCP Per Patient  Chief Complaint  Patient presents with  . GI Problem    Pt had gall bladder removed 8/28, there is pain at the incision site   . Nausea    x 1 week   . Emesis    Subjective:   HPI 20 year old female presents for evaluation of nausea and vomiting times 1 week. Patient had a laparoscopic cholecystectomy on 08/08/16. Since surgery she has experienced pain at the umbilicus and intermittent nausea. She reports post operative visit with general surgery following procedure in which they referred her to [facility] gastroenterology.  She had an appointment with [facility] yesterday but reports that she did not have transportation to their office at that time. She presents today with consistent nausea without vomiting times 1 week and diarrhea. Denies fever,reports chills. Reports decreased appetite. Abdominal pain at the umbilicus and pelvic region. She hasn't taken any medication for pain, nausea or diarrhea.  Review of Systems See HPI  Patient Active Problem List   Diagnosis Date Noted  . MDD (major depressive disorder), recurrent episode, severe ([place]) 02/15/2013  . GAD (generalized anxiety disorder) 02/15/2013     Prior to Admission medications   Medication Sig Start Date End Date Taking? Authorizing Provider  etonogestrel-ethinyl estradiol (NUVARING) 0.12-0.015 MG/24HR vaginal ring Place 1 each vaginally every 28 (twenty-eight) days. Insert vaginally and leave in place for 3 consecutive weeks, then remove for 1 week.   Yes Historical Provider, MD     Allergies  Allergen Reactions  . Citrus Itching and Other (See Comments)    SWEATING  . Sulfa Antibiotics Nausea Only and Other (See Comments)    FEVER       Objective:  Physical Exam  Constitutional: She is oriented to person, place, and time. She appears well-developed and well-nourished.  HENT:  Head: Normocephalic and  atraumatic.  Right Ear: External ear normal.  Left Ear: External ear normal.  Nose: Nose normal.  Mouth/Throat: Oropharynx is clear and moist.  Eyes: Conjunctivae and EOM are normal. Pupils are equal, round, and reactive to light.  Neck: Normal range of motion. Neck supple.  Cardiovascular: Normal rate, regular rhythm, normal heart sounds and intact distal pulses.   Pulmonary/Chest: Effort normal and breath sounds normal.  Abdominal: Soft. She exhibits no mass. There is tenderness. There is no rebound and no guarding.  Tenderness subcutaneously at the laparoscopic incision. Incision appears to be healing with slight erythremia present. Negative of exudate or puffiness. All of other regions of the abdominal region negative for tenderness.   Musculoskeletal: Normal range of motion.  Neurological: She is alert and oriented to person, place, and time.  Skin: Skin is warm and dry.  Psychiatric: She has a normal mood and affect. Her behavior is normal. Judgment and thought content normal.   Ct Abdomen Pelvis W Contrast  Result Date: 09/11/2016 CLINICAL DATA:  Generalized abdominal pain for 1 week, history of previous cholecystectomy EXAM: CT ABDOMEN AND PELVIS WITH CONTRAST TECHNIQUE: Multidetector CT imaging of the abdomen and pelvis was performed using the standard protocol following bolus administration of intravenous contrast. CONTRAST:  124mL ISOVUE-300 IOPAMIDOL (ISOVUE-300) INJECTION 61% COMPARISON:  09/10/2016 FINDINGS: Lower chest: No acute abnormality noted. Hepatobiliary: No focal liver abnormality is seen. Status post cholecystectomy. Minimal dilatation of the common bile duct is noted consistent with the post cholecystectomy state. Pancreas: Unremarkable. No pancreatic ductal  dilatation or surrounding inflammatory changes. Spleen: Normal in size without focal abnormality. Adrenals/Urinary Tract: Adrenal glands are unremarkable. Kidneys are normal, without renal calculi, focal lesion, or  hydronephrosis. Bladder is decompressed. Stomach/Bowel: Stomach is within normal limits. Appendix appears normal. No evidence of bowel wall thickening, distention, or inflammatory changes. Vascular/Lymphatic: No significant vascular findings are present. No enlarged abdominal or pelvic lymph nodes. Reproductive: Nuvaring is noted in place. No other focal abnormality is noted. Other: No abdominal wall hernia or abnormality. No abdominopelvic ascites. Musculoskeletal: No acute or significant osseous findings. IMPRESSION: Status post cholecystectomy.  No acute abnormality noted. Electronically Signed   By: Inez Catalina M.D.   On: 09/11/2016 18:35   Dg Abd 2 Views  Result Date: 09/10/2016 CLINICAL DATA:  Postsurgical pain status post cholecystectomy. EXAM: ABDOMEN - 2 VIEW COMPARISON:  None. FINDINGS: Surgical clips in the right upper quadrant, compatible with the given history of cholecystectomy. No evidence of soft tissue mass or abnormal fluid collection. No evidence of free intraperitoneal air. Paucity of bowel gas within the upper abdomen. Visualized bowel gas pattern is nonobstructive. Osseous structures are unremarkable.  Lung bases are clear. IMPRESSION: Status post cholecystectomy.  No acute findings. Electronically Signed   By: Franki Cabot M.D.   On: 09/10/2016 16:58   Vitals:   09/10/16 1557  BP: 104/60  Pulse: 68  Resp: 16  Temp: 98.6 F (37 C)   .rislt Assessment & Plan:  1. Generalized abdominal pain - POCT CBC - POCT Microscopic Urinalysis ([facility]) - POCT urinalysis dipstick - DG Abd 2 Views; Future - Ambulatory referral to Gastroenterology - Urine culture 2. Nausea and vomiting, intractability of vomiting not specified, unspecified vomiting type 3. Diarrhea, unspecified type  Patient presents with a 1 week history of nausea, diarrhea, and localized pain of the umbilicus. No evidence of urinary tract infection although ketones, bilirubin, protein present, urine was negative for  leukocytes aand nitrates (culture pending due to large amount of protein) , negative for pregnancy, no evidence of post operative septic infection, gastritis unlikely, and digital x-ray showed intact surgical clips. Patient had a follow-up scheduled with gastroenterology yesterday. Will send a referral to have an appointment rescheduled with gastroenterology and treat patient's current symptoms.  Plan:  .  ondansetron (ZOFRAN-ODT) 8 MG disintegrating tablet    Sig: Take 1 tablet (8 mg total) by mouth every 8 (eight) hours as needed for nausea.    Dispense:  30 tablet  . diphenoxylate-atropine (LOMOTIL) 2.5-0.025 MG tablet    Sig: Take 1 tablet by mouth 4 (four) times daily as needed for diarrhea or loose stools.   Gastroenterology will contact you regarding scheduling your appointment.  Carroll Sage. Kenton Kingfisher, MSN, FNP-C [facility]

## 2016-09-10 NOTE — Patient Instructions (Addendum)
I have placed a referral for you to follow-up with [facility]. They will contact you to schedule your appointment.  Take Zofran 8 mg, every 8 hours as needed for nausea.  Take Lomotil 2.5-0.025 mg 1 tablet, 4 times daily as needed for diarrhea.    IF you received an x-ray today, you will receive an invoice from [facility]. Please contact [facility] at [phone] with questions or concerns regarding your invoice.   IF you received labwork today, you will receive an invoice from [employer]. Please contact [facility] at [phone] with questions or concerns regarding your invoice.   Our billing staff will not be able to assist you with questions regarding bills from these companies.  You will be contacted with the lab results as soon as they are available. The fastest way to get your results is to activate your My Chart account. Instructions are located on the last page of this paperwork. If you have not heard from [place] regarding the results in 2 weeks, please contact this office.

## 2016-09-11 ENCOUNTER — Emergency Department (HOSPITAL_COMMUNITY)
Admission: EM | Admit: 2016-09-11 | Discharge: 2016-09-11 | Disposition: A | Discharge status: Home / Self Care | Payer: Commercial Managed Care - HMO | Attending: Emergency Medicine | Admitting: Emergency Medicine

## 2016-09-11 ENCOUNTER — Emergency Department (HOSPITAL_COMMUNITY): Payer: Commercial Managed Care - HMO

## 2016-09-11 ENCOUNTER — Encounter (HOSPITAL_COMMUNITY): Payer: Self-pay | Admitting: Emergency Medicine

## 2016-09-11 DIAGNOSIS — R1033 Periumbilical pain: Secondary | ICD-10-CM | POA: Diagnosis present

## 2016-09-11 DIAGNOSIS — R110 Nausea: Secondary | ICD-10-CM | POA: Diagnosis not present

## 2016-09-11 DIAGNOSIS — R109 Unspecified abdominal pain: Secondary | ICD-10-CM

## 2016-09-11 DIAGNOSIS — Z79899 Other long term (current) drug therapy: Secondary | ICD-10-CM | POA: Diagnosis not present

## 2016-09-11 DIAGNOSIS — R197 Diarrhea, unspecified: Secondary | ICD-10-CM | POA: Diagnosis not present

## 2016-09-11 DIAGNOSIS — N3001 Acute cystitis with hematuria: Secondary | ICD-10-CM | POA: Diagnosis not present

## 2016-09-11 LAB — CBC WITH DIFFERENTIAL/PLATELET
Basophils Absolute: 0 10*3/uL (ref 0.0–0.1)
Basophils Relative: 1 %
Eosinophils Absolute: 0.1 10*3/uL (ref 0.0–0.7)
Eosinophils Relative: 2 %
HCT: 38.4 % (ref 36.0–46.0)
Hemoglobin: 12.4 g/dL (ref 12.0–15.0)
Lymphocytes Relative: 41 %
Lymphs Abs: 1.9 10*3/uL (ref 0.7–4.0)
MCH: 29.2 pg (ref 26.0–34.0)
MCHC: 32.3 g/dL (ref 30.0–36.0)
MCV: 90.4 fL (ref 78.0–100.0)
Monocytes Absolute: 0.4 10*3/uL (ref 0.1–1.0)
Monocytes Relative: 8 %
Neutro Abs: 2.2 10*3/uL (ref 1.7–7.7)
Neutrophils Relative %: 48 %
Platelets: 224 10*3/uL (ref 150–400)
RBC: 4.25 MIL/uL (ref 3.87–5.11)
RDW: 13 % (ref 11.5–15.5)
WBC: 4.6 10*3/uL (ref 4.0–10.5)

## 2016-09-11 LAB — BASIC METABOLIC PANEL
Anion gap: 7 (ref 5–15)
BUN: <5 mg/dL — ABNORMAL LOW (ref 6–20)
CO2: 25 mmol/L (ref 22–32)
Calcium: 9 mg/dL (ref 8.9–10.3)
Chloride: 109 mmol/L (ref 101–111)
Creatinine, Ser: 0.73 mg/dL (ref 0.44–1.00)
GFR calc Af Amer: >60 mL/min (ref >60)
GFR calc non Af Amer: >60 mL/min (ref >60)
Glucose, Bld: 104 mg/dL — ABNORMAL HIGH (ref 65–99)
Potassium: 3.6 mmol/L (ref 3.5–5.1)
Sodium: 141 mmol/L (ref 135–145)

## 2016-09-11 LAB — URINE MICROSCOPIC-ADD ON

## 2016-09-11 LAB — URINALYSIS, ROUTINE W REFLEX MICROSCOPIC
Glucose, UA: NEGATIVE mg/dL
Ketones, ur: >80 mg/dL — AB
Leukocytes, UA: NEGATIVE
Nitrite: NEGATIVE
Protein, ur: 30 mg/dL — AB
Specific Gravity, Urine: >1.03 — ABNORMAL HIGH (ref 1.005–1.030)
pH: 6 (ref 5.0–8.0)

## 2016-09-11 LAB — PREGNANCY, URINE: Preg Test, Ur: NEGATIVE

## 2016-09-11 LAB — LIPASE, BLOOD: Lipase: 33 U/L (ref 11–51)

## 2016-09-11 MED ORDER — CEPHALEXIN 250 MG/5ML PO SUSR
500.0000 mg | Freq: Once | ORAL | Status: AC
  Administered 2016-09-11: 500 mg via ORAL
  Filled 2016-09-11: qty 10

## 2016-09-11 MED ORDER — ONDANSETRON HCL 4 MG/2ML IJ SOLN
4.0000 mg | Freq: Once | INTRAMUSCULAR | Status: AC
  Administered 2016-09-11: 4 mg via INTRAVENOUS
  Filled 2016-09-11: qty 2

## 2016-09-11 MED ORDER — ONDANSETRON 8 MG PO TBDP: 8.0000 mg | ORAL_TABLET | Freq: Three times a day (TID) | ORAL | 0 refills | Status: DC | PRN

## 2016-09-11 MED ORDER — IOPAMIDOL (ISOVUE-300) INJECTION 61%
INTRAVENOUS | Status: AC
Start: 2016-09-11 — End: 2016-09-11
  Administered 2016-09-11: 100 mL
  Filled 2016-09-11: qty 100

## 2016-09-11 MED ORDER — IOPAMIDOL (ISOVUE-300) INJECTION 61%
INTRAVENOUS | Status: AC
  Filled 2016-09-11: qty 30

## 2016-09-11 MED ORDER — CEPHALEXIN 250 MG PO CAPS
500.0000 mg | ORAL_CAPSULE | Freq: Once | ORAL | Status: DC
  Filled 2016-09-11: qty 2

## 2016-09-11 MED ORDER — SODIUM CHLORIDE 0.9 % IV BOLUS (SEPSIS)
1000.0000 mL | Freq: Once | INTRAVENOUS | Status: AC
  Administered 2016-09-11: 1000 mL via INTRAVENOUS

## 2016-09-11 MED ORDER — MORPHINE SULFATE (PF) 4 MG/ML IV SOLN
4.0000 mg | Freq: Once | INTRAVENOUS | Status: DC
  Filled 2016-09-11: qty 1

## 2016-09-11 MED ORDER — CEPHALEXIN 250 MG/5ML PO SUSR: 500.0000 mg | Freq: Two times a day (BID) | ORAL | 0 refills | Status: AC

## 2016-09-11 MED ORDER — ONDANSETRON HCL 4 MG/2ML IJ SOLN
4.0000 mg | Freq: Once | INTRAMUSCULAR | Status: DC
  Filled 2016-09-11: qty 2

## 2016-09-11 NOTE — ED Notes (Signed)
Patient refused the morphine and zofran

## 2016-09-11 NOTE — Discharge Instructions (Signed)
Medications: Keflex, Zofran  Treatment: Take Keflex as prescribed for your urinary tract infection for 1 week. Take Zofran every 8 hours as needed for nausea and vomiting. You can take Tylenol as prescribed over-the-counter for your pain. Make sure to drink plenty of water, but take small sips.  Follow-up: Please follow-up with the gastroenterologist that you were referred to or the one outlined on your discharge paperwork below. Please return the emergency Department if you develop any new or worsening symptoms including fever, intractable vomiting and inability to tolerate fluids, severe pain, or any other new or concerning symptoms.

## 2016-09-11 NOTE — ED Notes (Signed)
Oral contrast dropped off

## 2016-09-11 NOTE — ED Provider Notes (Signed)
[facility] DEPT Provider Note   CSN: [id number] Arrival date & time: 09/11/16  1435     History   Chief Complaint Chief Complaint  Patient presents with  . Abdominal Pain  . Diarrhea    HPI Victoria Carson is a 20 y.o. female with recent history of laparoscopic cholecystectomy on 08/08/16 who presents with a one-week history of abdominal pain, nonbloody diarrhea, nausea, decreased appetite, weight loss. Patient reports generalized abdominal pain, with some localized sharp pains to the epigastric region and lower chest. Patient also reports generalized cramping to her periumbilical area. Patient has had frequent bouts of diarrhea, especially after attempting to eat. Patient has lost 7 pounds in the past week. She has had persistent nausea, but no vomiting. She denies any fevers. Patient's LMP was 1 week ago and was normal. She denies any abnormal vaginal discharge or bleeding. Patient denies shortness of breath, urinary symptoms. Patient has not taken any medications for this. Patient has spoken to her general surgeon who referred her to GI. Patient had an appointment with GI on Friday, however she could not make it. However, patient was seen at urgent care yesterday who scheduled a emergency referral to GI for her. She plans to follow-up this week.  HPI  Past Medical History:  Diagnosis Date  . Chronic cholecystitis 07/2016  . Dental crown present   . Difficulty swallowing pills   . Dust allergy   . GERD (gastroesophageal reflux disease)    no current med.    Patient Active Problem List   Diagnosis Date Noted  . MDD (major depressive disorder), recurrent episode, severe ([place]) 02/15/2013  . GAD (generalized anxiety disorder) 02/15/2013    Past Surgical History:  Procedure Laterality Date  . CHOLECYSTECTOMY N/A 08/08/2016   Procedure: LAPAROSCOPIC CHOLECYSTECTOMY;  Surgeon: Coralie Keens, MD;  Location: [facility];  Service: General;  Laterality: N/A;  .  WISDOM TOOTH EXTRACTION      OB History    No data available       Home Medications    Prior to Admission medications   Medication Sig Start Date End Date Taking? Authorizing Provider  cephALEXin (KEFLEX) 250 MG/5ML suspension Take 10 mLs (500 mg total) by mouth 2 (two) times daily. 09/11/16 09/18/16  Ysidro Ramsay M Maecie Sevcik, PA-C  diphenoxylate-atropine (LOMOTIL) 2.5-0.025 MG tablet Take 1 tablet by mouth 4 (four) times daily as needed for diarrhea or loose stools. 09/10/16   Sedalia Muta, FNP  etonogestrel-ethinyl estradiol (NUVARING) 0.12-0.015 MG/24HR vaginal ring Place 1 each vaginally every 28 (twenty-eight) days. Insert vaginally and leave in place for 3 consecutive weeks, then remove for 1 week.    Historical Provider, MD  ondansetron (ZOFRAN-ODT) 8 MG disintegrating tablet Take 1 tablet (8 mg total) by mouth every 8 (eight) hours as needed for nausea. 09/11/16   Frederica Kuster, PA-C    Family History Family History  Problem Relation Age of Onset  . Mental illness Mother   . Drug abuse Mother   . Heart failure Other   . Hypertension Other   . Depression Paternal Aunt   . Anxiety disorder Paternal Aunt     Social History Social History  Substance Use Topics  . Smoking status: Never Smoker  . Smokeless tobacco: Never Used  . Alcohol use No     Allergies   Citrus and Sulfa antibiotics   Review of Systems Review of Systems  Constitutional: Positive for appetite change and unexpected weight change. Negative for chills and fever.  HENT: Negative for facial swelling and sore throat.   Respiratory: Negative for shortness of breath.   Cardiovascular: Positive for chest pain (intermittent along with epigastric pain).  Gastrointestinal: Positive for abdominal pain, diarrhea and nausea. Negative for vomiting.  Genitourinary: Negative for dysuria, frequency, urgency, vaginal bleeding and vaginal discharge.  Musculoskeletal: Negative for back pain.  Skin: Negative for  rash and wound.  Neurological: Negative for headaches.  Psychiatric/Behavioral: The patient is not nervous/anxious.      Physical Exam Updated Vital Signs BP 110/71   Pulse 102   Temp 98.6 F (37 C) (Oral)   Resp 18   Ht 5\' 9"  (1.753 m)   Wt 55.8 kg   LMP 09/04/2016   SpO2 98%   BMI 18.16 kg/m   Physical Exam  Constitutional: She appears well-developed and well-nourished. No distress.  HENT:  Head: Normocephalic and atraumatic.  Mouth/Throat: Oropharynx is clear and moist. No oropharyngeal exudate.  Eyes: Conjunctivae are normal. Pupils are equal, round, and reactive to light. Right eye exhibits no discharge. Left eye exhibits no discharge. No scleral icterus.  Neck: Normal range of motion. Neck supple. No thyromegaly present.  Cardiovascular: Normal rate, regular rhythm and normal heart sounds.  Exam reveals no gallop and no friction rub.   No murmur heard. Pulmonary/Chest: Effort normal and breath sounds normal. No stridor. No respiratory distress. She has no wheezes. She has no rales.  Abdominal: Soft. Bowel sounds are normal. She exhibits no distension. There is tenderness in the right upper quadrant, epigastric area and periumbilical area. There is no rebound and no guarding.  Generalized tenderness, more specific to right upper quadrant, epigastric, periumbilical region  Musculoskeletal: She exhibits no edema.  Lymphadenopathy:    She has no cervical adenopathy.  Neurological: She is alert. Coordination normal.  Skin: Skin is warm and dry. No rash noted. She is not diaphoretic. No pallor.  Psychiatric: She has a normal mood and affect.  Nursing note and vitals reviewed.    ED Treatments / Results  Labs (all labs ordered are listed, but only abnormal results are displayed) Labs Reviewed  BASIC METABOLIC PANEL - Abnormal; Notable for the following:       Result Value   Glucose, Bld 104 (*)    BUN <5 (*)    All other components within normal limits  URINALYSIS,  ROUTINE W REFLEX MICROSCOPIC (NOT AT [facility]) - Abnormal; Notable for the following:    APPearance CLOUDY (*)    Specific Gravity, Urine >1.030 (*)    Hgb urine dipstick MODERATE (*)    Bilirubin Urine MODERATE (*)    Ketones, ur >80 (*)    Protein, ur 30 (*)    All other components within normal limits  URINE MICROSCOPIC-ADD ON - Abnormal; Notable for the following:    Squamous Epithelial / LPF 0-5 (*)    Bacteria, UA MANY (*)    All other components within normal limits  CBC WITH DIFFERENTIAL/PLATELET  LIPASE, BLOOD  PREGNANCY, URINE    EKG  EKG Interpretation None       Radiology Ct Abdomen Pelvis W Contrast  Result Date: 09/11/2016 CLINICAL DATA:  Generalized abdominal pain for 1 week, history of previous cholecystectomy EXAM: CT ABDOMEN AND PELVIS WITH CONTRAST TECHNIQUE: Multidetector CT imaging of the abdomen and pelvis was performed using the standard protocol following bolus administration of intravenous contrast. CONTRAST:  116mL ISOVUE-300 IOPAMIDOL (ISOVUE-300) INJECTION 61% COMPARISON:  09/10/2016 FINDINGS: Lower chest: No acute abnormality noted. Hepatobiliary: No focal liver  abnormality is seen. Status post cholecystectomy. Minimal dilatation of the common bile duct is noted consistent with the post cholecystectomy state. Pancreas: Unremarkable. No pancreatic ductal dilatation or surrounding inflammatory changes. Spleen: Normal in size without focal abnormality. Adrenals/Urinary Tract: Adrenal glands are unremarkable. Kidneys are normal, without renal calculi, focal lesion, or hydronephrosis. Bladder is decompressed. Stomach/Bowel: Stomach is within normal limits. Appendix appears normal. No evidence of bowel wall thickening, distention, or inflammatory changes. Vascular/Lymphatic: No significant vascular findings are present. No enlarged abdominal or pelvic lymph nodes. Reproductive: Nuvaring is noted in place. No other focal abnormality is noted. Other: No abdominal wall  hernia or abnormality. No abdominopelvic ascites. Musculoskeletal: No acute or significant osseous findings. IMPRESSION: Status post cholecystectomy.  No acute abnormality noted. Electronically Signed   By: Inez Catalina M.D.   On: 09/11/2016 18:35   Dg Abd 2 Views  Result Date: 09/10/2016 CLINICAL DATA:  Postsurgical pain status post cholecystectomy. EXAM: ABDOMEN - 2 VIEW COMPARISON:  None. FINDINGS: Surgical clips in the right upper quadrant, compatible with the given history of cholecystectomy. No evidence of soft tissue mass or abnormal fluid collection. No evidence of free intraperitoneal air. Paucity of bowel gas within the upper abdomen. Visualized bowel gas pattern is nonobstructive. Osseous structures are unremarkable.  Lung bases are clear. IMPRESSION: Status post cholecystectomy.  No acute findings. Electronically Signed   By: Franki Cabot M.D.   On: 09/10/2016 16:58    Procedures Procedures (including critical care time)  Medications Ordered in ED Medications  iopamidol (ISOVUE-300) 61 % injection (not administered)  ondansetron (ZOFRAN) injection 4 mg (4 mg Intravenous Not Given 09/11/16 1859)  morphine 4 MG/ML injection 4 mg (4 mg Intravenous Not Given 09/11/16 1859)  sodium chloride 0.9 % bolus 1,000 mL (0 mLs Intravenous Stopped 09/11/16 1846)  ondansetron (ZOFRAN) injection 4 mg (4 mg Intravenous Given 09/11/16 1550)  iopamidol (ISOVUE-300) 61 % injection (100 mLs  Contrast Given 09/11/16 1813)  cephALEXin (KEFLEX) 250 MG/5ML suspension 500 mg (500 mg Oral Given 09/11/16 1859)     Initial Impression / Assessment and Plan / ED Course  I have reviewed the triage vital signs and the nursing notes.  Pertinent labs & imaging results that were available during my care of the patient were reviewed by me and considered in my medical decision making (see chart for details).  Clinical Course    CBC, BMP, lipase unremarkable. UA shows moderate hematuria, moderate bilirubin, ketones  >80, protein 30, many bacteria, 0-5 epithelial. Will treat for UTI as possible cause for nausea. CT abdomen pelvis shows status post cholecystectomy, but no acute abnormality noted. Refer to the GI for further evaluation of upper abdominal pain pain, diarrhea. She is feeling much better after Zofran. Tolerating fluids prior to discharge. Keflex given prior to discharge. Discharge with Keflex, Zofran. Advised to keep fluid intake. Patient understands and agrees with plan. Return precautions discussed. Patient vitals stable throughout ED course and discharged in satisfactory condition.  Final Clinical Impressions(s) / ED Diagnoses   Final diagnoses:  Abdominal pain, unspecified abdominal location  Nausea  Diarrhea, unspecified type  Acute cystitis with hematuria    New Prescriptions New Prescriptions   CEPHALEXIN (KEFLEX) 250 MG/5ML SUSPENSION    Take 10 mLs (500 mg total) by mouth 2 (two) times daily.     Frederica Kuster, PA-C 09/11/16 2151    Margette Fast, MD 09/13/16 [phone]

## 2016-09-11 NOTE — ED Triage Notes (Signed)
Pt c/o abdominal pain, decrease appetite and diarrhea x 1 week. Pt denies exposure to others with illness.

## 2016-09-13 LAB — URINE CULTURE

## 2016-09-14 ENCOUNTER — Encounter: Payer: Self-pay | Admitting: Family Medicine

## 2016-09-19 ENCOUNTER — Ambulatory Visit: Payer: Self-pay | Admitting: Psychology

## 2016-09-21 ENCOUNTER — Ambulatory Visit (INDEPENDENT_AMBULATORY_CARE_PROVIDER_SITE_OTHER): Payer: 59 | Admitting: Psychology

## 2016-09-21 DIAGNOSIS — F411 Generalized anxiety disorder: Secondary | ICD-10-CM

## 2016-09-26 ENCOUNTER — Ambulatory Visit: Payer: 59 | Admitting: Psychology

## 2016-10-07 ENCOUNTER — Ambulatory Visit (INDEPENDENT_AMBULATORY_CARE_PROVIDER_SITE_OTHER): Payer: 59 | Admitting: Psychology

## 2016-10-07 DIAGNOSIS — F411 Generalized anxiety disorder: Secondary | ICD-10-CM | POA: Diagnosis not present

## 2016-10-17 ENCOUNTER — Ambulatory Visit (INDEPENDENT_AMBULATORY_CARE_PROVIDER_SITE_OTHER): Payer: Commercial Managed Care - HMO | Admitting: Primary Care

## 2016-10-17 ENCOUNTER — Encounter: Payer: Self-pay | Admitting: Primary Care

## 2016-10-17 ENCOUNTER — Ambulatory Visit (INDEPENDENT_AMBULATORY_CARE_PROVIDER_SITE_OTHER): Payer: 59 | Admitting: Psychology

## 2016-10-17 VITALS — BP 108/68 | HR 78 | Temp 98.0°F | Ht 68.5 in | Wt 126.4 lb

## 2016-10-17 DIAGNOSIS — Z309 Encounter for contraceptive management, unspecified: Secondary | ICD-10-CM | POA: Insufficient documentation

## 2016-10-17 DIAGNOSIS — F332 Major depressive disorder, recurrent severe without psychotic features: Secondary | ICD-10-CM

## 2016-10-17 DIAGNOSIS — N3010 Interstitial cystitis (chronic) without hematuria: Secondary | ICD-10-CM | POA: Diagnosis not present

## 2016-10-17 DIAGNOSIS — Z1152 Encounter for screening for COVID-19: Secondary | ICD-10-CM | POA: Insufficient documentation

## 2016-10-17 DIAGNOSIS — Z3044 Encounter for surveillance of vaginal ring hormonal contraceptive device: Secondary | ICD-10-CM | POA: Diagnosis not present

## 2016-10-17 DIAGNOSIS — F411 Generalized anxiety disorder: Secondary | ICD-10-CM

## 2016-10-17 DIAGNOSIS — F331 Major depressive disorder, recurrent, moderate: Secondary | ICD-10-CM

## 2016-10-17 MED ORDER — ETONOGESTREL-ETHINYL ESTRADIOL 0.12-0.015 MG/24HR VA RING: VAGINAL_RING | VAGINAL | 11 refills | Status: DC

## 2016-10-17 MED ORDER — VENLAFAXINE HCL ER 37.5 MG PO CP24: 37.5000 mg | ORAL_CAPSULE | Freq: Every day | ORAL | 1 refills | Status: DC
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
## 2016-10-17 NOTE — Assessment & Plan Note (Signed)
Following with therapy weekly. GAD 7 score of 19 and PHQ 9 score of 18 today. Long discussion today regarding treatment options. Failed treatment on Paxil, Lexapro, and Remeron. Will trial low dose Effexor XR 37.5 mg given mixture of anxiety and depression.  We discussed possible side effects of headache, GI upset, drowsiness, and SI/HI. If thoughts of SI/HI develop, we discussed to present to the emergency immediately. Patient verbalized understanding.   Follow up in 6 weeks.

## 2016-10-17 NOTE — Progress Notes (Signed)
Pre visit review using our clinic review tool, if applicable. No additional management support is needed unless otherwise documented below in the visit note. 

## 2016-10-17 NOTE — Assessment & Plan Note (Signed)
Needing refills of NuvaRing. LMP 10/12/16, currently menstruating. Refill sent to pharmacy.

## 2016-10-17 NOTE — Patient Instructions (Signed)
Start Effexor XR (Venlafaxine ER) 37.5 mg capsules for anxiety and depression. Take 1 capsule by mouth every morning with breakfast or first meal.  Continue therapy as scheduled.  You will be contacted regarding your referral to Urology.  Please let us know if you have not heard back within one week.   I sent refills for NuvaRing to your pharmacy. Insert 1 ring vaginally, leave in for 3 consecutive weeks, then remove for 1 week.  Please schedule a follow up appointment with me in 6 weeks for re-evaluation of Effexor, or sooner if needed.  It was a pleasure to meet you today! Please don't hesitate to call me with any questions. Welcome to [facility]!

## 2016-10-17 NOTE — Assessment & Plan Note (Signed)
Referral placed to Urology for further evaluation. She was successful with Uribel.

## 2016-10-24 ENCOUNTER — Ambulatory Visit (INDEPENDENT_AMBULATORY_CARE_PROVIDER_SITE_OTHER): Payer: 59 | Admitting: Psychology

## 2016-10-24 DIAGNOSIS — F411 Generalized anxiety disorder: Secondary | ICD-10-CM

## 2016-11-09 ENCOUNTER — Ambulatory Visit (INDEPENDENT_AMBULATORY_CARE_PROVIDER_SITE_OTHER): Payer: 59 | Admitting: Psychology

## 2016-11-09 DIAGNOSIS — F411 Generalized anxiety disorder: Secondary | ICD-10-CM | POA: Diagnosis not present

## 2016-11-16 ENCOUNTER — Ambulatory Visit (INDEPENDENT_AMBULATORY_CARE_PROVIDER_SITE_OTHER): Payer: 59 | Admitting: Psychology

## 2016-11-16 DIAGNOSIS — F4310 Post-traumatic stress disorder, unspecified: Secondary | ICD-10-CM

## 2016-11-17 ENCOUNTER — Ambulatory Visit (INDEPENDENT_AMBULATORY_CARE_PROVIDER_SITE_OTHER): Payer: 59 | Admitting: Psychology

## 2016-11-17 DIAGNOSIS — F411 Generalized anxiety disorder: Secondary | ICD-10-CM

## 2016-11-18 ENCOUNTER — Ambulatory Visit: Payer: Commercial Managed Care - HMO | Admitting: Urology

## 2016-11-18 VITALS — BP 93/66 | HR 83 | Ht 68.5 in | Wt 128.1 lb

## 2016-11-18 DIAGNOSIS — N3010 Interstitial cystitis (chronic) without hematuria: Secondary | ICD-10-CM | POA: Diagnosis not present

## 2016-11-18 LAB — URINALYSIS, COMPLETE
BILIRUBIN UA: NEGATIVE
Glucose, UA: NEGATIVE
KETONES UA: NEGATIVE
NITRITE UA: NEGATIVE
Protein, UA: NEGATIVE
SPEC GRAV UA: 1.02 (ref 1.005–1.030)
UUROB: 0.2 mg/dL (ref 0.2–1.0)
pH, UA: 8.5 — ABNORMAL HIGH (ref 5.0–7.5)

## 2016-11-18 LAB — MICROSCOPIC EXAMINATION: Bacteria, UA: NONE SEEN

## 2016-11-18 NOTE — Progress Notes (Signed)
11/18/2016 10:33 AM   [name] 1996-03-18 [id number]  Referring provider: Pleas Koch, NP [place] [place], [place] [address]  Chief Complaint  Patient presents with  . New Patient (Initial Visit)    IC    HPI: I was consulted to assess the patient's suprapubic pain that she has almost daily over the last many months. She has suprapubic discomfort that comes and goes but is worse after she urinates. Her bladder is becoming more urgent. She voids every 2-3 hours due to urgency and sometimes discomfort. She is not sexually active.  She denies a history kidney stones previous GU surgery and urinary tract infections. She is not a diabetic and her bowel movements are normal  Uribel helps but she is trying not to take it with her dycylomine for irritable bowel syndrome  Modifying factors: There are no other modifying factors  Associated signs and symptoms: There are no other associated signs and symptoms Aggravating and relieving factors: There are no other aggravating or relieving factors Severity: Moderate Duration: Persistent     PMH: Past Medical History:  Diagnosis Date  . Chronic cholecystitis 07/2016  . Dental crown present   . Difficulty swallowing pills   . Dust allergy   . GERD (gastroesophageal reflux disease)    no current med.  . IBS (irritable bowel syndrome)   . Migraines     Surgical History: Past Surgical History:  Procedure Laterality Date  . CHOLECYSTECTOMY N/A 08/08/2016   Procedure: LAPAROSCOPIC CHOLECYSTECTOMY;  Surgeon: Coralie Keens, MD;  Location: [facility];  Service: General;  Laterality: N/A;  . WISDOM TOOTH EXTRACTION      Home Medications:    Medication List       Accurate as of 11/18/16 10:33 AM. Always use your most recent med list.          dicyclomine 10 MG capsule Commonly known as:  BENTYL TAKE 1 CAPSULE THREE TIMES A DAY (TAKE PRIOR TO MEALS AND AT BEDTIME) FOR ABDOMINAL PAIN     etonogestrel-ethinyl estradiol 0.12-0.015 MG/24HR vaginal ring Commonly known as:  NUVARING Insert vaginally, leave in for 3 consecutive weeks, then remove for 1 week.       Allergies:  Allergies  Allergen Reactions  . Citrus Itching and Other (See Comments)    SWEATING  . Sulfa Antibiotics Nausea Only and Other (See Comments)    FEVER    Family History: Family History  Problem Relation Age of Onset  . Mental illness Mother   . Drug abuse Mother   . Heart failure Other   . Hypertension Other   . Depression Paternal Aunt   . Anxiety disorder Paternal Aunt     Social History:  reports that she has never smoked. She has never used smokeless tobacco. She reports that she does not drink alcohol or use drugs.  ROS: UROLOGY Frequent Urination?: Yes Hard to postpone urination?: Yes Burning/pain with urination?: Yes Get up at night to urinate?: Yes Leakage of urine?: Yes Urine stream starts and stops?: No Trouble starting stream?: No Do you have to strain to urinate?: No Blood in urine?: No Urinary tract infection?: No Sexually transmitted disease?: No Injury to kidneys or bladder?: No Painful intercourse?: Yes Weak stream?: No Currently pregnant?: No Vaginal bleeding?: No Last menstrual period?: No  Gastrointestinal Nausea?: No Vomiting?: No Indigestion/heartburn?: No Diarrhea?: No Constipation?: Yes  Constitutional Fever: Yes Night sweats?: No Weight loss?: No Fatigue?: No  Skin Skin rash/lesions?: No  Itching?: No  Eyes Blurred vision?: No Double vision?: No  Ears/Nose/Throat Sore throat?: Yes Sinus problems?: Yes  Hematologic/Lymphatic Swollen glands?: No Easy bruising?: No  Cardiovascular Leg swelling?: No Chest pain?: No  Respiratory Cough?: Yes Shortness of breath?: No  Endocrine Excessive thirst?: No  Musculoskeletal Back pain?: No Joint pain?: No  Neurological Headaches?: No Dizziness?: Yes  Psychologic Depression?:  Yes Anxiety?: Yes  Physical Exam: BP 93/66   Pulse 83   Ht 5' 8.5" (1.74 m)   Wt 128 lb 1.6 oz (58.1 kg)   BMI 19.19 kg/m   Constitutional:  Alert and oriented, No acute distress. HEENT: [place] AT, moist mucus membranes.  Trachea midline, no masses. Cardiovascular: No clubbing, cyanosis, or edema. Respiratory: Normal respiratory effort, no increased work of breathing. GI: Abdomen is soft, nontender, nondistended, no abdominal masses GU: No CVA tenderness. Right levator muscle minimally tender. Left levator and bladder nontender Skin: No rashes, bruises or suspicious lesions. Lymph: No cervical or inguinal adenopathy. Neurologic: Grossly intact, no focal deficits, moving all 4 extremities. Psychiatric: Normal mood and affect.  Laboratory Data: Lab Results  Component Value Date   WBC 4.6 09/11/2016   HGB 12.4 09/11/2016   HCT 38.4 09/11/2016   MCV 90.4 09/11/2016   PLT 224 09/11/2016    Lab Results  Component Value Date   CREATININE 0.73 09/11/2016    No results found for: PSA  No results found for: TESTOSTERONE  No results found for: HGBA1C  Urinalysis    Component Value Date/Time   COLORURINE YELLOW 09/11/2016 1523   APPEARANCEUR CLOUDY (A) 09/11/2016 1523   LABSPEC >1.030 (H) 09/11/2016 1523   PHURINE 6.0 09/11/2016 1523   GLUCOSEU NEGATIVE 09/11/2016 1523   HGBUR MODERATE (A) 09/11/2016 1523   BILIRUBINUR MODERATE (A) 09/11/2016 1523   BILIRUBINUR moderate (A) 09/10/2016 1621   BILIRUBINUR neg 12/13/2014 1658   KETONESUR >80 (A) 09/11/2016 1523   PROTEINUR 30 (A) 09/11/2016 1523   UROBILINOGEN 0.2 09/10/2016 1621   UROBILINOGEN 1.0 08/25/2015 2320   NITRITE NEGATIVE 09/11/2016 1523   LEUKOCYTESUR NEGATIVE 09/11/2016 1523    Pertinent Imaging: none  Assessment & Plan:  The patient may have interstitial cystitis. She has mild urinary frequency. The role of a hydrodistention was discussed. Pros and cons and risks were discussed. I utilized my usual template  including rare bladder perforation and sequela  I recommend for patient to have HOD in [facility] but she lives in [place] and preferred to be done locally. She came to [facility] to shorten her wait and in the future will likely be going to school close by. She is aware I will continue to see her here as well  1. Interstitial cystitis 2. Urinary frequency 3. Abdominal pain   - Urinalysis, Complete   No Follow-up on file.  Reece Packer, MD  [facility] [address] [place], [place] [address] [phone]

## 2016-11-23 LAB — CULTURE, URINE COMPREHENSIVE

## 2016-11-24 ENCOUNTER — Ambulatory Visit (INDEPENDENT_AMBULATORY_CARE_PROVIDER_SITE_OTHER): Payer: 59 | Admitting: Psychology

## 2016-11-24 DIAGNOSIS — F4310 Post-traumatic stress disorder, unspecified: Secondary | ICD-10-CM

## 2016-11-28 ENCOUNTER — Ambulatory Visit: Payer: Self-pay | Admitting: Primary Care

## 2016-11-30 ENCOUNTER — Encounter (HOSPITAL_COMMUNITY): Payer: Self-pay | Admitting: Vascular Surgery

## 2016-11-30 ENCOUNTER — Telehealth: Payer: Self-pay | Admitting: Primary Care

## 2016-11-30 ENCOUNTER — Emergency Department (HOSPITAL_COMMUNITY)
Admission: EM | Admit: 2016-11-30 | Discharge: 2016-12-01 | Disposition: A | Discharge status: Home / Self Care | Payer: Commercial Managed Care - HMO | Attending: Emergency Medicine | Admitting: Emergency Medicine

## 2016-11-30 DIAGNOSIS — M545 Low back pain: Secondary | ICD-10-CM | POA: Diagnosis not present

## 2016-11-30 DIAGNOSIS — R1030 Lower abdominal pain, unspecified: Secondary | ICD-10-CM | POA: Insufficient documentation

## 2016-11-30 LAB — WET PREP, GENITAL
Sperm: NONE SEEN
TRICH WET PREP: NONE SEEN
YEAST WET PREP: NONE SEEN

## 2016-11-30 LAB — COMPREHENSIVE METABOLIC PANEL
ALBUMIN: 3.7 g/dL (ref 3.5–5.0)
ALT: 12 U/L — ABNORMAL LOW (ref 14–54)
ANION GAP: 6 (ref 5–15)
AST: 16 U/L (ref 15–41)
Alkaline Phosphatase: 65 U/L (ref 38–126)
BUN: 6 mg/dL (ref 6–20)
CHLORIDE: 107 mmol/L (ref 101–111)
CO2: 26 mmol/L (ref 22–32)
Calcium: 9.3 mg/dL (ref 8.9–10.3)
Creatinine, Ser: 0.7 mg/dL (ref 0.44–1.00)
GFR calc Af Amer: >60 mL/min (ref >60)
GLUCOSE: 95 mg/dL (ref 65–99)
POTASSIUM: 3.7 mmol/L (ref 3.5–5.1)
Sodium: 139 mmol/L (ref 135–145)
Total Bilirubin: 0.5 mg/dL (ref 0.3–1.2)
Total Protein: 6.8 g/dL (ref 6.5–8.1)

## 2016-11-30 LAB — URINALYSIS, ROUTINE W REFLEX MICROSCOPIC
BILIRUBIN URINE: NEGATIVE
Glucose, UA: NEGATIVE mg/dL
KETONES UR: 80 mg/dL — AB
NITRITE: NEGATIVE
PROTEIN: 30 mg/dL — AB
SPECIFIC GRAVITY, URINE: 1.029 (ref 1.005–1.030)
pH: 5 (ref 5.0–8.0)

## 2016-11-30 LAB — PREGNANCY, URINE: Preg Test, Ur: NEGATIVE

## 2016-11-30 LAB — I-STAT BETA HCG BLOOD, ED (MC, WL, AP ONLY)

## 2016-11-30 LAB — CBC
HEMATOCRIT: 40.5 % (ref 36.0–46.0)
HEMOGLOBIN: 13.4 g/dL (ref 12.0–15.0)
MCH: 30.2 pg (ref 26.0–34.0)
MCHC: 33.1 g/dL (ref 30.0–36.0)
MCV: 91.2 fL (ref 78.0–100.0)
Platelets: 227 10*3/uL (ref 150–400)
RBC: 4.44 MIL/uL (ref 3.87–5.11)
RDW: 12.8 % (ref 11.5–15.5)
WBC: 4.2 10*3/uL (ref 4.0–10.5)

## 2016-11-30 LAB — LIPASE, BLOOD: LIPASE: 26 U/L (ref 11–51)

## 2016-11-30 MED ORDER — ACETAMINOPHEN 500 MG PO TABS
1000.0000 mg | ORAL_TABLET | Freq: Once | ORAL | Status: DC
  Filled 2016-11-30: qty 2

## 2016-11-30 MED ORDER — SODIUM CHLORIDE 0.9 % IV BOLUS (SEPSIS)
1000.0000 mL | Freq: Once | INTRAVENOUS | Status: AC
  Administered 2016-11-30: 1000 mL via INTRAVENOUS

## 2016-11-30 MED ORDER — ONDANSETRON HCL 4 MG/2ML IJ SOLN
4.0000 mg | Freq: Once | INTRAMUSCULAR | Status: AC
  Administered 2016-11-30: 4 mg via INTRAVENOUS
  Filled 2016-11-30: qty 2

## 2016-11-30 NOTE — Telephone Encounter (Signed)
[place] Patient Name: Victoria Carson DOB: [birth year] Initial Comment Caller is having severe abdominal pain and back pain. Nurse Assessment Nurse: Markus Daft, RN, Sherre Poot Date/Time Eilene Ghazi Time): 11/30/2016 3:43:50 PM Confirm and document reason for call. If symptomatic, describe symptoms. ---Caller is having severe lower mid abdominal pain and lower back pain. She has a UTI - she is on antibiotics. No pain with urination. due for Cystoscopy on Monday. Does the patient have any new or worsening symptoms? ---Yes Will a triage be completed? ---Yes Related visit to physician within the last 2 weeks? ---Yes Does the PT have any chronic conditions? (i.e. diabetes, asthma, etc.) ---No Is the patient pregnant or possibly pregnant? (Ask all females between the ages of 64-55) ---No Is this a behavioral health or substance abuse call? ---No Guidelines Guideline Title Affirmed Question Affirmed Notes Urinary Tract Infection on Antibiotic Follow-up Call - Female [1] Unable to urinate (or only a few drops) > 4 hours AND [2] bladder feels very full (e.g., palpable bladder or strong urge to urinate) Final Disposition User Go to ED Now Markus Daft, RN, [facility] - ED Disagree/Comply: Comply

## 2016-11-30 NOTE — Telephone Encounter (Signed)
Noted. Agree that she needs urgent evaluation. Please call patient on 12/01/16 and check on her symptoms. Do not see ED evaluation as of yet.

## 2016-11-30 NOTE — ED Triage Notes (Addendum)
Pt reports to the ED for eval of bilateral flank pain and lower abd pain. She is currently being treated for a UTI. She reports she has had an increased in her UTIs so she is having a cystoscopy on Monday. She reports nausea and chills. Denies any known fevers or active emesis. Reports decreased urinary frequency. Has not urinated today. She denies any hematuria, urgency, or frequency. Has missed several abx pills.

## 2016-11-30 NOTE — ED Provider Notes (Signed)
[facility] DEPT Provider Note   CSN: [id number] Arrival date & time: 11/30/16  1716     History   Chief Complaint Chief Complaint  Patient presents with  . Flank Pain  . Urinary Tract Infection     HPI  Blood pressure 114/67, pulse 103, temperature 99.6 F (37.6 C), temperature source Oral, resp. rate 16, height 5\' 8"  (1.727 m), weight 55.8 kg, SpO2 96 %.  Victoria Carson is a 20 y.o. female with past medical history significant for IBS, chronic cholecystitis (status post cholecystectomy in August, states she's been dehydrated since that time) complaining of suprapubic and low back pain worsening over the course of one week, significantly worsening yesterday, woke her from sleep at 1 AM, no pain medication taken prior to arrival. She had a first visit with urologist Dr. Henrene Hawking she has a planned bladder dilation in approximately 1 week. She was started on unknown antibiotic for possible UTI at that time, she is in compliant with this. States that she's been eating and drinking normally tactile fever, chills nausea no vomiting. Patient has been cleared from OB/GYN perspective and states that she was told that her discomfort is not coming from the reproductive organs. She denies any vaginal rash, abnormal vaginal discharge.  Past Medical History:  Diagnosis Date  . Chronic cholecystitis 07/2016  . Dental crown present   . Difficulty swallowing pills   . Dust allergy   . GERD (gastroesophageal reflux disease)    no current med.  . IBS (irritable bowel syndrome)   . Migraines     Patient Active Problem List   Diagnosis Date Noted  . Interstitial cystitis 10/17/2016  . Encounter for surveillance of vaginal ring hormonal contraceptive device 10/17/2016  . MDD (major depressive disorder), recurrent episode, severe ([place]) 02/15/2013  . GAD (generalized anxiety disorder) 02/15/2013    Past Surgical History:  Procedure Laterality Date  . CHOLECYSTECTOMY N/A 08/08/2016     Procedure: LAPAROSCOPIC CHOLECYSTECTOMY;  Surgeon: Coralie Keens, MD;  Location: [facility];  Service: General;  Laterality: N/A;  . WISDOM TOOTH EXTRACTION      OB History    No data available       Home Medications    Prior to Admission medications   Medication Sig Start Date End Date Taking? Authorizing Provider  dicyclomine (BENTYL) 10 MG capsule TAKE 1 CAPSULE THREE TIMES A DAY (TAKE PRIOR TO MEALS AND AT BEDTIME) FOR ABDOMINAL PAIN 09/14/16  Yes Historical Provider, MD  etonogestrel-ethinyl estradiol (NUVARING) 0.12-0.015 MG/24HR vaginal ring Insert vaginally, leave in for 3 consecutive weeks, then remove for 1 week. 10/17/16  Yes Pleas Koch, NP  Meth-Hyo-M Barnett Hatter Phos-Ph Sal (URIBEL) 118 MG CAPS Take 118 mg by mouth daily as needed (for symptoms).   Yes Historical Provider, MD  nitrofurantoin (MACRODANTIN) 100 MG capsule Take 100 mg by mouth 2 (two) times daily. 11/23/16  Yes Historical Provider, MD    Family History Family History  Problem Relation Age of Onset  . Mental illness Mother   . Drug abuse Mother   . Heart failure Other   . Hypertension Other   . Depression Paternal Aunt   . Anxiety disorder Paternal Aunt     Social History Social History  Substance Use Topics  . Smoking status: Never Smoker  . Smokeless tobacco: Never Used  . Alcohol use No     Allergies   Citrus and Sulfa antibiotics   Review of Systems Review of Systems  10 systems  reviewed and found to be negative, except as noted in the HPI.   Physical Exam Updated Vital Signs BP 111/69   Pulse 101   Temp 99.6 F (37.6 C) (Oral)   Resp 16   Ht 5\' 8"  (1.727 m)   Wt 55.8 kg   SpO2 97%   BMI 18.70 kg/m   Physical Exam  Constitutional: She is oriented to person, place, and time. She appears well-developed and well-nourished. No distress.  HENT:  Head: Normocephalic and atraumatic.  Mouth/Throat: Oropharynx is clear and moist.  Eyes: Conjunctivae and EOM  are normal. Pupils are equal, round, and reactive to light.  Neck: Normal range of motion.  Cardiovascular: Normal rate, regular rhythm and intact distal pulses.   Pulmonary/Chest: Effort normal and breath sounds normal.  Abdominal: Soft. There is tenderness.  Normoactive bowel sounds, diffusely exquisitely tender to light palpation of the lower abdomen. No guarding or rebound.  Musculoskeletal: Normal range of motion.  Neurological: She is alert and oriented to person, place, and time.  Skin: She is not diaphoretic.  Psychiatric: She has a normal mood and affect.  Nursing note and vitals reviewed.    ED Treatments / Results  Labs (all labs ordered are listed, but only abnormal results are displayed) Labs Reviewed  WET PREP, GENITAL - Abnormal; Notable for the following:       Result Value   Clue Cells Wet Prep HPF POC PRESENT (*)    WBC, Wet Prep HPF POC MODERATE (*)    All other components within normal limits  COMPREHENSIVE METABOLIC PANEL - Abnormal; Notable for the following:    ALT 12 (*)    All other components within normal limits  URINALYSIS, ROUTINE W REFLEX MICROSCOPIC - Abnormal; Notable for the following:    APPearance HAZY (*)    Hgb urine dipstick LARGE (*)    Ketones, ur 80 (*)    Protein, ur 30 (*)    Leukocytes, UA TRACE (*)    Bacteria, UA RARE (*)    Squamous Epithelial / LPF 0-5 (*)    All other components within normal limits  LIPASE, BLOOD  CBC  PREGNANCY, URINE  I-STAT BETA HCG BLOOD, ED ([facility], [facility], [facility] ONLY)  GC/CHLAMYDIA PROBE AMP () NOT AT [facility]    EKG  EKG Interpretation None       Radiology No results found.  Procedures Procedures (including critical care time)  Medications Ordered in ED Medications  acetaminophen (TYLENOL) tablet 1,000 mg (1,000 mg Oral Not Given 11/30/16 2131)  sodium chloride 0.9 % bolus 1,000 mL (0 mLs Intravenous Stopped 11/30/16 2259)  ondansetron (ZOFRAN) injection 4 mg (4 mg Intravenous Given  11/30/16 2131)     Initial Impression / Assessment and Plan / ED Course  I have reviewed the triage vital signs and the nursing notes.  Pertinent labs & imaging results that were available during my care of the patient were reviewed by me and considered in my medical decision making (see chart for details).  Clinical Course     Vitals:   11/30/16 2145 11/30/16 2200 11/30/16 2215 11/30/16 2230  BP:  112/74  111/69  Pulse: 96 97 96 101  Resp:      Temp:      TempSrc:      SpO2: 100% 98% 95% 97%  Weight:      Height:        Medications  acetaminophen (TYLENOL) tablet 1,000 mg (1,000 mg Oral Not Given 11/30/16 2131)  sodium chloride 0.9 % bolus 1,000 mL (0 mLs Intravenous Stopped 11/30/16 2259)  ondansetron (ZOFRAN) injection 4 mg (4 mg Intravenous Given 11/30/16 2131)    Lamar Chokshi Surratt is 20 y.o. female presenting with Lower abdominal pain and low back pain worsening over the course of several months to severe discomfort yesterday awoke her from sleep. She is exquisitely tender to light palpation of the bilateral lower quadrants. She has normal active bowel sounds. I'm concerned on the level of her discomfort however, she's had multiple CAT scans that she's had a history of IBS and is currently being worked up for interstitial cystitis, don't think another imaging study is in her best interest given her normal blood work.  Urinalysis with a large amount of hemoglobin, trace leukocytes and greater than 80 ketones. I think she is dehydrated, patient given bolus and she feels much better. Repeat abdominal exam is much less tender in the lower quadrants. Exam performed with no significant abnormality, wet prep with clue cells and a moderate amount of white blood cells however she's not sectioned active and not worry about STDs. She denying any vaginal discharge, no indication for treatment of a a symptomatically bacterial vaginosis.  Evidence encouraged her to remain compliant with her  antibiotics and uribel from urology.  Evaluation does not show pathology that would require ongoing emergent intervention or inpatient treatment. Pt is hemodynamically stable and mentating appropriately. Discussed findings and plan with patient/guardian, who agrees with care plan. All questions answered. Return precautions discussed and outpatient follow up given.    Final Clinical Impressions(s) / ED Diagnoses   Final diagnoses:  Lower abdominal pain    New Prescriptions New Prescriptions   No medications on file     Monico Blitz, PA-C 12/01/16 0023    Julianne Rice, MD 12/01/16 [phone]

## 2016-12-01 ENCOUNTER — Ambulatory Visit (INDEPENDENT_AMBULATORY_CARE_PROVIDER_SITE_OTHER): Payer: 59 | Admitting: Psychology

## 2016-12-01 DIAGNOSIS — F411 Generalized anxiety disorder: Secondary | ICD-10-CM

## 2016-12-01 LAB — GC/CHLAMYDIA PROBE AMP (~~LOC~~) NOT AT ARMC
CHLAMYDIA, DNA PROBE: NEGATIVE
Neisseria Gonorrhea: NEGATIVE

## 2016-12-01 NOTE — Discharge Instructions (Signed)
Take acetaminophen (Tylenol) up to 975 mg (this is normally 3 over-the-counter pills) up to 3 times a day. Do not drink alcohol. Make sure your other medications do not contain acetaminophen (Read the labels!) ° ° °Please follow with your primary care doctor in the next 2 days for a check-up. They must obtain records for further management.  ° °Do not hesitate to return to the Emergency Department for any new, worsening or concerning symptoms.  ° °

## 2016-12-02 NOTE — Telephone Encounter (Signed)
Noted. Patient went to ED on 11/30/2016.

## 2016-12-07 ENCOUNTER — Ambulatory Visit: Payer: 59 | Admitting: Psychology

## 2016-12-08 ENCOUNTER — Ambulatory Visit (INDEPENDENT_AMBULATORY_CARE_PROVIDER_SITE_OTHER): Payer: 59 | Admitting: Psychology

## 2016-12-08 DIAGNOSIS — F411 Generalized anxiety disorder: Secondary | ICD-10-CM | POA: Diagnosis not present

## 2016-12-10 ENCOUNTER — Other Ambulatory Visit: Payer: Self-pay | Admitting: Primary Care

## 2016-12-10 DIAGNOSIS — F411 Generalized anxiety disorder: Secondary | ICD-10-CM

## 2016-12-10 DIAGNOSIS — F331 Major depressive disorder, recurrent, moderate: Secondary | ICD-10-CM

## 2016-12-13 NOTE — Telephone Encounter (Signed)
Ok to refill? Electronically refill request for   venlafaxine XR (EFFEXOR XR) 37.5 MG 24 hr capsule  Last prescribed and seen on 10/17/2016.

## 2016-12-14 ENCOUNTER — Ambulatory Visit (INDEPENDENT_AMBULATORY_CARE_PROVIDER_SITE_OTHER): Payer: 59 | Admitting: Psychology

## 2016-12-14 DIAGNOSIS — F4310 Post-traumatic stress disorder, unspecified: Secondary | ICD-10-CM | POA: Diagnosis not present

## 2016-12-15 NOTE — Telephone Encounter (Signed)
Pt states she stopped taking this med bc "it was making her really angry" I offered an appt to discuss other options. Pt scheduled.

## 2016-12-19 ENCOUNTER — Ambulatory Visit (INDEPENDENT_AMBULATORY_CARE_PROVIDER_SITE_OTHER): Payer: 59 | Admitting: Psychology

## 2016-12-19 DIAGNOSIS — Z0289 Encounter for other administrative examinations: Secondary | ICD-10-CM

## 2016-12-19 DIAGNOSIS — F411 Generalized anxiety disorder: Secondary | ICD-10-CM | POA: Diagnosis not present

## 2016-12-20 ENCOUNTER — Ambulatory Visit: Payer: Self-pay | Admitting: Primary Care

## 2016-12-21 ENCOUNTER — Telehealth: Payer: Self-pay | Admitting: Primary Care

## 2016-12-21 NOTE — Telephone Encounter (Signed)
Patient did not come in for their appointment on 01/09 for medication management   Please let me know if patient needs to be contacted immediately for follow up or no follow up needed.

## 2016-12-21 NOTE — Telephone Encounter (Signed)
Yes, please reschedule patient at her convenience.

## 2016-12-23 NOTE — Telephone Encounter (Signed)
Per DPR, left detail message left for patient to reschedule appointment.

## 2016-12-26 ENCOUNTER — Ambulatory Visit: Payer: 59 | Admitting: Psychology

## 2017-01-02 ENCOUNTER — Ambulatory Visit (INDEPENDENT_AMBULATORY_CARE_PROVIDER_SITE_OTHER): Payer: 59 | Admitting: Psychology

## 2017-01-02 DIAGNOSIS — F411 Generalized anxiety disorder: Secondary | ICD-10-CM | POA: Diagnosis not present

## 2017-01-17 ENCOUNTER — Ambulatory Visit (INDEPENDENT_AMBULATORY_CARE_PROVIDER_SITE_OTHER): Payer: 59 | Admitting: Psychology

## 2017-01-17 DIAGNOSIS — F411 Generalized anxiety disorder: Secondary | ICD-10-CM

## 2017-02-01 ENCOUNTER — Ambulatory Visit (INDEPENDENT_AMBULATORY_CARE_PROVIDER_SITE_OTHER): Payer: 59 | Admitting: Psychology

## 2017-02-01 DIAGNOSIS — F411 Generalized anxiety disorder: Secondary | ICD-10-CM

## 2017-02-13 ENCOUNTER — Ambulatory Visit: Payer: Self-pay | Admitting: Psychology

## 2017-02-27 ENCOUNTER — Ambulatory Visit (INDEPENDENT_AMBULATORY_CARE_PROVIDER_SITE_OTHER): Payer: 59 | Admitting: Nurse Practitioner

## 2017-02-27 ENCOUNTER — Encounter: Payer: Self-pay | Admitting: Nurse Practitioner

## 2017-02-27 VITALS — BP 102/72 | HR 67 | Temp 98.7°F | Ht 69.0 in | Wt 126.0 lb

## 2017-02-27 DIAGNOSIS — R42 Dizziness and giddiness: Secondary | ICD-10-CM | POA: Diagnosis not present

## 2017-02-27 DIAGNOSIS — R110 Nausea: Secondary | ICD-10-CM

## 2017-02-27 DIAGNOSIS — K5900 Constipation, unspecified: Secondary | ICD-10-CM | POA: Diagnosis not present

## 2017-02-27 LAB — POCT URINALYSIS DIPSTICK
BILIRUBIN UA: NEGATIVE
Blood, UA: NEGATIVE
GLUCOSE UA: NEGATIVE
Ketones, UA: NEGATIVE
NITRITE UA: NEGATIVE
Spec Grav, UA: 1.015 (ref 1.030–1.035)
UROBILINOGEN UA: 1 (ref ?–2.0)
pH, UA: 8.5 — AB (ref 5.0–8.0)

## 2017-02-27 LAB — POCT URINE PREGNANCY: PREG TEST UR: NEGATIVE

## 2017-02-27 NOTE — Patient Instructions (Addendum)
Encourage adequate oral hydration.  May use Colace or Ducosate or miralax for constipation (over the counter medications, follow directions of package)   Dizziness Dizziness is a common problem. It makes you feel unsteady or light-headed. You may feel like you are about to pass out (faint). Dizziness can lead to getting hurt if you stumble or fall. Dizziness can be caused by many things, including:  Medicines.  Not having enough water in your body (dehydration).  Illness. Follow these instructions at home: Eating and drinking   Drink enough fluid to keep your pee (urine) clear or pale yellow. This helps to keep you from getting dehydrated. Try to drink more clear fluids, such as water.  Do not drink alcohol.  Limit how much caffeine you drink or eat, if your doctor tells you to do that.  Limit how much salt (sodium) you drink or eat, if your doctor tells you to do that. Activity   Avoid making quick movements.  When you stand up from sitting in a chair, steady yourself until you feel okay.  In the morning, first sit up on the side of the bed. When you feel okay, stand slowly while you hold onto something. Do this until you know that your balance is fine.  If you need to stand in one place for a long time, move your legs often. Tighten and relax the muscles in your legs while you are standing.  Do not drive or use heavy machinery if you feel dizzy.  Avoid bending down if you feel dizzy. Place items in your home so you can reach them easily without leaning over. Lifestyle   Do not use any products that contain nicotine or tobacco, such as cigarettes and e-cigarettes. If you need help quitting, ask your doctor.  Try to lower your stress level. You can do this by using methods such as yoga or meditation. Talk with your doctor if you need help. General instructions   Watch your dizziness for any changes.  Take over-the-counter and prescription medicines only as told by your  doctor. Talk with your doctor if you think that you are dizzy because of a medicine that you are taking.  Tell a friend or a family member that you are feeling dizzy. If he or she notices any changes in your behavior, have this person call your doctor.  Keep all follow-up visits as told by your doctor. This is important. Contact a doctor if:  Your dizziness does not go away.  Your dizziness or light-headedness gets worse.  You feel sick to your stomach (nauseous).  You have trouble hearing.  You have new symptoms.  You are unsteady on your feet.  You feel like the room is spinning. Get help right away if:  You throw up (vomit) or have watery poop (diarrhea), and you cannot eat or drink anything.  You have trouble:  Talking.  Walking.  Swallowing.  Using your arms, hands, or legs.  You feel generally weak.  You are not thinking clearly, or you have trouble forming sentences. A friend or family member may notice this.  You have:  Chest pain.  Pain in your belly (abdomen).  Shortness of breath.  Sweating.  Your vision changes.  You are bleeding.  You have a very bad headache.  You have neck pain or a stiff neck.  You have a fever. These symptoms may be an emergency. Do not wait to see if the symptoms will go away. Get medical help right away.  Call your local emergency services (911 in the [place] Do not drive yourself to the hospital. Summary  Dizziness makes you feel unsteady or light-headed. You may feel like you are about to pass out (faint).  Drink enough fluid to keep your pee (urine) clear or pale yellow. Do not drink alcohol.  Avoid making quick movements if you feel dizzy.  Watch your dizziness for any changes. This information is not intended to replace advice given to you by your health care provider. Make sure you discuss any questions you have with your health care provider. Document Released: 11/17/2011 Document Revised: 12/15/2016 Document  Reviewed: 12/15/2016 Elsevier Interactive Patient Education  2017 [employer].

## 2017-02-27 NOTE — Progress Notes (Signed)
Subjective:  Patient ID: Victoria Carson, female    DOB: May 24, 1996  Age: 21 y.o. MRN: [id number]  CC: Dizziness (Pt stated nausea, sweating, last night.)   Dizziness  This is a new problem. The current episode started yesterday. The problem has been resolved. Associated symptoms include abdominal pain and nausea. Pertinent negatives include no anorexia, change in bowel habit, chills, congestion, coughing, diaphoresis, fatigue, fever, myalgias, neck pain, numbness, rash, sore throat, swollen glands, vertigo, visual change, vomiting or weakness. The symptoms are aggravated by stress. She has tried nothing for the symptoms.  Constipation  This is a recurrent problem. The problem has been waxing and waning since onset. Her stool frequency is 2 to 3 times per week. The stool is described as formed and pellet like. The patient is not on a high fiber diet. She does not exercise regularly. There has not been adequate water intake. Associated symptoms include abdominal pain, bloating and nausea. Pertinent negatives include no anorexia, diarrhea, difficulty urinating, fecal incontinence, fever, flatus, hematochezia, hemorrhoids, melena, rectal pain, vomiting or weight loss. Risk factors include stress. She has tried nothing for the symptoms.    Outpatient Medications Prior to Visit  Medication Sig Dispense Refill  . etonogestrel-ethinyl estradiol (NUVARING) 0.12-0.015 MG/24HR vaginal ring Insert vaginally, leave in for 3 consecutive weeks, then remove for 1 week. 1 each 11  . dicyclomine (BENTYL) 10 MG capsule TAKE 1 CAPSULE THREE TIMES A DAY (TAKE PRIOR TO MEALS AND AT BEDTIME) FOR ABDOMINAL PAIN  3  . Meth-Hyo-M Bl-Na Phos-Ph Sal (URIBEL) 118 MG CAPS Take 118 mg by mouth daily as needed (for symptoms).    . nitrofurantoin (MACRODANTIN) 100 MG capsule Take 100 mg by mouth 2 (two) times daily.  0  . venlafaxine XR (EFFEXOR-XR) 37.5 MG 24 hr capsule TAKE 1 CAPSULE (37.5 MG TOTAL) BY MOUTH DAILY WITH  BREAKFAST. (Patient not taking: Reported on 02/27/2017) 30 capsule 0   No facility-administered medications prior to visit.     ROS See HPI  Objective:  BP 102/72   Pulse 67   Temp 98.7 F (37.1 C)   Ht 5\' 9"  (1.753 m)   Wt 126 lb (57.2 kg)   SpO2 98%   BMI 18.61 kg/m   BP Readings from Last 3 Encounters:  02/27/17 102/72  11/30/16 111/69  11/18/16 93/66    Wt Readings from Last 3 Encounters:  02/27/17 126 lb (57.2 kg) (46 %, Z= -0.10)*  11/30/16 123 lb (55.8 kg) (41 %, Z= -0.23)*  11/18/16 128 lb 1.6 oz (58.1 kg) (51 %, Z= 0.02)*   * Growth percentiles are based on CDC 2-20 Years data.    Physical Exam  Constitutional: She is oriented to person, place, and time. No distress.  HENT:  Nose: Nose normal. Right sinus exhibits no maxillary sinus tenderness and no frontal sinus tenderness. Left sinus exhibits no maxillary sinus tenderness and no frontal sinus tenderness.  Mouth/Throat: Uvula is midline and oropharynx is clear and moist. No posterior oropharyngeal erythema.  Neck: Normal range of motion. No thyromegaly present.  Cardiovascular: Normal rate, regular rhythm and normal heart sounds.   Pulmonary/Chest: Effort normal and breath sounds normal.  Abdominal: Soft. Bowel sounds are normal. She exhibits no distension. There is no tenderness.  Musculoskeletal: She exhibits no edema or tenderness.  Lymphadenopathy:    She has no cervical adenopathy.  Neurological: She is alert and oriented to person, place, and time.  Skin: Skin is warm and dry.  Vitals reviewed.  Lab Results  Component Value Date   WBC 4.2 11/30/2016   HGB 13.4 11/30/2016   HCT 40.5 11/30/2016   PLT 227 11/30/2016   GLUCOSE 95 11/30/2016   ALT 12 (L) 11/30/2016   AST 16 11/30/2016   NA 139 11/30/2016   K 3.7 11/30/2016   CL 107 11/30/2016   CREATININE 0.70 11/30/2016   BUN 6 11/30/2016   CO2 26 11/30/2016   TSH 3.991 01/02/2013    No results found.  Assessment & Plan:   Victoria Carson  was seen today for dizziness.  Diagnoses and all orders for this visit:  Light-headedness -     POCT urinalysis dipstick -     POCT urine pregnancy  Nausea -     POCT urinalysis dipstick -     POCT urine pregnancy  Constipation, unspecified constipation type   I have discontinued Ms. Surratt's nitrofurantoin, URIBEL, and venlafaxine XR. I am also having her maintain her dicyclomine and etonogestrel-ethinyl estradiol.  No orders of the defined types were placed in this encounter.   Follow-up: Return if symptoms worsen or fail to improve.  Wilfred Lacy, NP

## 2017-03-15 ENCOUNTER — Ambulatory Visit: Payer: 59 | Admitting: Psychology

## 2017-03-15 ENCOUNTER — Ambulatory Visit (INDEPENDENT_AMBULATORY_CARE_PROVIDER_SITE_OTHER): Payer: 59 | Admitting: Psychology

## 2017-03-15 DIAGNOSIS — F411 Generalized anxiety disorder: Secondary | ICD-10-CM

## 2017-04-13 ENCOUNTER — Ambulatory Visit (INDEPENDENT_AMBULATORY_CARE_PROVIDER_SITE_OTHER): Payer: 59 | Admitting: Psychology

## 2017-04-13 DIAGNOSIS — F411 Generalized anxiety disorder: Secondary | ICD-10-CM

## 2017-04-26 ENCOUNTER — Ambulatory Visit (INDEPENDENT_AMBULATORY_CARE_PROVIDER_SITE_OTHER): Payer: 59 | Admitting: Psychology

## 2017-04-26 DIAGNOSIS — F411 Generalized anxiety disorder: Secondary | ICD-10-CM | POA: Diagnosis not present

## 2017-05-16 ENCOUNTER — Ambulatory Visit (INDEPENDENT_AMBULATORY_CARE_PROVIDER_SITE_OTHER): Payer: 59 | Admitting: Psychology

## 2017-05-16 DIAGNOSIS — F411 Generalized anxiety disorder: Secondary | ICD-10-CM

## 2017-10-25 ENCOUNTER — Ambulatory Visit: Payer: 59 | Admitting: Primary Care

## 2017-10-25 ENCOUNTER — Encounter: Payer: Self-pay | Admitting: Primary Care

## 2017-10-25 VITALS — BP 104/64 | HR 84 | Temp 98.5°F | Ht 69.0 in | Wt 124.1 lb

## 2017-10-25 DIAGNOSIS — L700 Acne vulgaris: Secondary | ICD-10-CM

## 2017-10-25 DIAGNOSIS — Z30013 Encounter for initial prescription of injectable contraceptive: Secondary | ICD-10-CM | POA: Diagnosis not present

## 2017-10-25 LAB — POCT URINE PREGNANCY: Preg Test, Ur: NEGATIVE

## 2017-10-25 MED ORDER — CLINDAMYCIN PHOS-BENZOYL PEROX 1-5 % EX GEL: Freq: Two times a day (BID) | CUTANEOUS | 0 refills | Status: DC

## 2017-10-25 MED ORDER — MEDROXYPROGESTERONE ACETATE 150 MG/ML IM SUSY
150.0000 mg | PREFILLED_SYRINGE | Freq: Once | INTRAMUSCULAR | Status: AC
  Administered 2017-10-25: 150 mg via INTRAMUSCULAR

## 2017-10-25 NOTE — Patient Instructions (Addendum)
You were provided with your first injection of Depo Provera. Follow up for subsequent injections as discussed.  Use back up protection for two weeks if you have intercourse.  You may experience breakthrough spotting.  Allow at least two cycles of Depo Provera to regulate symptoms.  Apply the clindamycin-benzyl peroxide twice daily to a clean face. Please update me in 2 months.  Have a great time in [place]! It was a pleasure to see you today!

## 2017-10-25 NOTE — Assessment & Plan Note (Signed)
Facial acne since early teen years, previously managed on topical clindamycin. Overall acne fairly mild. Will have her try benzaclin BID and update in 1-2 months.

## 2017-10-25 NOTE — Progress Notes (Signed)
Subjective:    Patient ID: Victoria Carson, female    DOB: Aug 02, 1996, 21 y.o.   MRN: [id number]  HPI  [name] is a 21 year old female who presents today to discuss changing birth control. She is currently managed on Nuva Ring. She would like to switch as she doesn't remember to put her ring in on time and will experience painful menstrual cycles. She's been on Nuva Ring for the past 2 years per GYN. She does have a history of endometriosis and will experience very painful and crampy cycles. Cycles vary in duration. LMP was 2 weeks ago.    She is also interested in treatment for acne. 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 She was once on topical clindamycin with improvement. She's tried several OTC agents without improvement.  Review of Systems  Constitutional: Negative for fever.  Respiratory: Negative for shortness of breath.   Cardiovascular: Negative for palpitations and leg swelling.  Genitourinary:       Dysmenorrhea with cycles. Can't remember to insert Nuva ring on time.       Past Medical History:  Diagnosis Date  . Chronic cholecystitis 07/2016  . Dental crown present   . Difficulty swallowing pills   . Dust allergy   . GERD (gastroesophageal reflux disease)    no current med.  . IBS (irritable bowel syndrome)   . Migraines      Social History   Socioeconomic History  . Marital status: Single    Spouse name: n/a  . Number of children: 0  . Years of education: Not on file  . Highest education level: Not on file  Social Needs  . Financial resource strain: Not on file  . Food insecurity - worry: Not on file  . Food insecurity - inability: Not on file  . Transportation needs - medical: Not on file  . Transportation needs - non-medical: Not on file  Occupational History  . 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 Smoking status: Never Smoker  . Smokeless tobacco: Never Used  Substance and  Sexual Activity  . Alcohol use: No  . Drug use: No  . Sexual activity: No    Partners: Male    Birth control/protection: Inserts  Other Topics Concern  . Not on file  Social History Narrative   05/01/2013 AHW Niesha was poor in [place], [place].. She currently lives in [place] with her father, grandfather, older sister, and younger brother. She currently is homeschooled and in the 10th grade. She has a boyfriend of 2 months. 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 2 years ago, she was caught shoplifting. She reports that her social support system consists of a friend. 05/01/2013 AHW      Single. Lives with father.    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   Enjoys spending time with her friends.                Past Surgical History:  Procedure Laterality Date  . WISDOM TOOTH EXTRACTION      Family History  Problem Relation Age of Onset  . Mental illness Mother   . Drug abuse Mother   . Heart failure Other   . Hypertension Other   . Depression Paternal Aunt   . Anxiety disorder Paternal Aunt     Allergies  Allergen Reactions  . Citrus Itching and Other (See Comments)  SWEATING  . Sulfa Antibiotics Nausea Only, Other (See Comments) and Nausea And Vomiting    FEVER    Current Outpatient Medications on File Prior to Visit  Medication Sig Dispense Refill  . etonogestrel-ethinyl estradiol (NUVARING) 0.12-0.015 MG/24HR vaginal ring Insert vaginally, leave in for 3 consecutive weeks, then remove for 1 week. (Patient not taking: Reported on 10/25/2017) 1 each 11   No current facility-administered medications on file prior to visit.     BP 104/64   Pulse 84   Temp 98.5 F (36.9 C) (Oral)   Ht 5\' 9"  (1.753 m)   Wt 124 lb 1.9 oz (56.3 kg)   LMP 10/11/2017   SpO2 99%   BMI 18.33 kg/m    Objective:   Physical Exam  Constitutional: She appears well-nourished.  Neck: Neck supple.  Cardiovascular: Normal rate and  regular rhythm.  Pulmonary/Chest: Effort normal and breath sounds normal.  Skin: Skin is warm and dry.  Mild facial acne to chin, cheeks, and around mouth. Old scarring present to face.          Assessment & Plan:

## 2017-10-25 NOTE — Assessment & Plan Note (Signed)
Switching from Nuva Ring to Depo Provera.  Discussed potential side effects. Discussed potential for breakthrough bleeding. Discussed to use back up protection for 2 weeks. Urine pregnancy negative.

## 2017-10-25 NOTE — Addendum Note (Signed)
Addended by: Jacqualin Combes on: 10/25/2017 05:00 PM   Modules accepted: Orders

## 2017-12-12 DIAGNOSIS — N3010 Interstitial cystitis (chronic) without hematuria: Secondary | ICD-10-CM

## 2017-12-12 HISTORY — DX: Interstitial cystitis (chronic) without hematuria: N30.10

## 2018-01-10 ENCOUNTER — Ambulatory Visit: Payer: 59 | Admitting: Primary Care

## 2018-01-10 VITALS — BP 106/66 | HR 101 | Temp 98.0°F | Ht 69.0 in | Wt 133.2 lb

## 2018-01-10 DIAGNOSIS — L700 Acne vulgaris: Secondary | ICD-10-CM

## 2018-01-10 DIAGNOSIS — Z23 Encounter for immunization: Secondary | ICD-10-CM | POA: Diagnosis not present

## 2018-01-10 DIAGNOSIS — Z3042 Encounter for surveillance of injectable contraceptive: Secondary | ICD-10-CM | POA: Diagnosis not present

## 2018-01-10 MED ORDER — MEDROXYPROGESTERONE ACETATE 150 MG/ML IM SUSY
150.0000 mg | PREFILLED_SYRINGE | Freq: Once | INTRAMUSCULAR | Status: AC
  Administered 2018-01-10: 150 mg via INTRAMUSCULAR

## 2018-01-10 MED ORDER — TRETINOIN 0.025 % EX CREA
TOPICAL_CREAM | Freq: Every day | CUTANEOUS | 0 refills | Status: DC
Start: 2018-01-10 — End: 2018-07-06

## 2018-01-10 NOTE — Assessment & Plan Note (Signed)
She will be seeing her dermatologist in March to discuss Accutane, this seems reasonable based off of acne severity.  Will trial Retin-A topical cream as she is not tried this in the past, until she can get to her dermatologist in March.  Discussed instructions for use, also discussed washing with benzyl peroxide in the morning.

## 2018-01-10 NOTE — Progress Notes (Signed)
Subjective:    Patient ID: Victoria Carson, female    DOB: 1996-09-11, 22 y.o.   MRN: [id number]  HPI  [name] is a 22 year old female who presents today for her depo provera injection and follow up of facial acne.    She will also be traveling to [place] in May on vacation and will be staying at a resort. She will be gone for one week. She will be due for her Td this year and would like to have this done.  She was last evaluated in mid November 2018 with reports of facial acne. She had been on topical clindamycin in the past and endorsed improvement so she was initiated on benzaclin.  Facial acne at that time was mild.  Since her last visit she never picked up her benzalin prescription as her father told her it didn't work when she took it years ago.  Facial acne will wax and wane in terms of severity, her current episode has been on the severe side since December 2018.  She's been using an "OTC face wash" and using a "moisturizer" without much improvement. She has an appointment with her dermatologist on March 5th to discuss treatment with Accutane.    Review of Systems  Genitourinary: Negative for menstrual problem.  Skin:       Acne vulgaris       Past Medical History:  Diagnosis Date  . Chronic cholecystitis 07/2016  . Dental crown present   . Difficulty swallowing pills   . Dust allergy   . GERD (gastroesophageal reflux disease)    no current med.  . IBS (irritable bowel syndrome)   . Migraines      Social History   Socioeconomic History  . Marital status: Single    Spouse name: n/a  . Number of children: 0  . Years of education: Not on file  . Highest education level: Not on file  Social Needs  . Financial resource strain: Not on file  . Food insecurity - worry: Not on file  . Food insecurity - inability: Not on file  . Transportation needs - medical: Not on file  . Transportation needs - non-medical: Not on file  Occupational History  . 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 Smoking status: Never Smoker  . Smokeless tobacco: Never Used  Substance and Sexual Activity  . Alcohol use: No  . Drug use: No  . Sexual activity: No    Partners: Male    Birth control/protection: Inserts  Other Topics Concern  . Not on file  Social History Narrative   05/01/2013 AHW Yolonda was poor in [place], [place].. She currently lives in [place] with her father, grandfather, older sister, and younger brother. She currently is homeschooled and in the 10th grade. She has a boyfriend of 2 months. 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 2 years ago, she was caught shoplifting. She reports that her social support system consists of a friend. 05/01/2013 AHW      Single. Lives with father.    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   Enjoys spending time with her friends.                Past Surgical History:  Procedure Laterality Date  . CHOLECYSTECTOMY N/A 08/08/2016   Procedure: LAPAROSCOPIC CHOLECYSTECTOMY;  Surgeon: Coralie Keens, MD;  Location: [facility];  Service: General;  Laterality: N/A;  . WISDOM TOOTH EXTRACTION      Family History  Problem Relation Age of Onset  . Mental illness Mother   . Drug abuse Mother   . Heart failure Other   . Hypertension Other   . Depression Paternal Aunt   . Anxiety disorder Paternal Aunt     Allergies  Allergen Reactions  . Citrus Itching and Other (See Comments)    SWEATING  . Sulfa Antibiotics Nausea Only, Other (See Comments) and Nausea And Vomiting    FEVER    No current outpatient medications on file prior to visit.   No current facility-administered medications on file prior to visit.     BP 106/66   Pulse (!) 101   Temp 98 F (36.7 C) (Oral)   Ht 5\' 9"  (1.753 m)   Wt 133 lb 4 oz (60.4 kg)   LMP  (LMP Unknown)   SpO2 99%   BMI 19.68 kg/m    Objective:   Physical Exam    Constitutional: She appears well-nourished.  Neck: Neck supple.  Cardiovascular: Normal rate and regular rhythm.  Pulmonary/Chest: Effort normal and breath sounds normal.  Skin: Skin is warm and dry.  Moderate to severe breakout of acne, some pustules.            Assessment & Plan:

## 2018-01-10 NOTE — Assessment & Plan Note (Signed)
Due for repeat Depo-Provera dose, administered today.

## 2018-01-10 NOTE — Patient Instructions (Signed)
Use benzoyl peroxide wash every morning and a mild wash at bedtime.  Start Retin-A acne cream. Apply topically to a clean face at bedtime. Make sure this soaks in before you go to bed.  Follow up with your dermatologist as scheduled.  Have a great trip! It was a pleasure to see you today!

## 2018-02-21 ENCOUNTER — Ambulatory Visit (INDEPENDENT_AMBULATORY_CARE_PROVIDER_SITE_OTHER): Payer: 59 | Admitting: Psychology

## 2018-02-21 DIAGNOSIS — F411 Generalized anxiety disorder: Secondary | ICD-10-CM

## 2018-04-10 ENCOUNTER — Ambulatory Visit (INDEPENDENT_AMBULATORY_CARE_PROVIDER_SITE_OTHER): Payer: 59 | Admitting: *Deleted

## 2018-04-10 DIAGNOSIS — Z3042 Encounter for surveillance of injectable contraceptive: Secondary | ICD-10-CM | POA: Diagnosis not present

## 2018-04-10 MED ORDER — MEDROXYPROGESTERONE ACETATE 150 MG/ML IM SUSP
150.0000 mg | Freq: Once | INTRAMUSCULAR | Status: AC
  Administered 2018-04-10: 150 mg via INTRAMUSCULAR

## 2018-06-27 ENCOUNTER — Ambulatory Visit (INDEPENDENT_AMBULATORY_CARE_PROVIDER_SITE_OTHER): Payer: 59 | Admitting: *Deleted

## 2018-06-27 DIAGNOSIS — Z3042 Encounter for surveillance of injectable contraceptive: Secondary | ICD-10-CM

## 2018-06-27 MED ORDER — MEDROXYPROGESTERONE ACETATE 150 MG/ML IM SUSY
150.0000 mg | PREFILLED_SYRINGE | Freq: Once | INTRAMUSCULAR | Status: AC
  Administered 2018-06-27: 150 mg via INTRAMUSCULAR

## 2018-06-27 NOTE — Progress Notes (Signed)
Per orders of Alma Friendly, NP injection of Depo-provera given by Tammi Sou. Patient tolerated injection well.

## 2018-07-06 ENCOUNTER — Ambulatory Visit: Payer: 59 | Admitting: Primary Care

## 2018-07-06 ENCOUNTER — Encounter: Payer: Self-pay | Admitting: Primary Care

## 2018-07-06 VITALS — BP 118/70 | HR 60 | Temp 98.5°F | Ht 69.0 in | Wt 135.5 lb

## 2018-07-06 DIAGNOSIS — R5383 Other fatigue: Secondary | ICD-10-CM

## 2018-07-06 DIAGNOSIS — B353 Tinea pedis: Secondary | ICD-10-CM

## 2018-07-06 LAB — IBC PANEL
IRON: 69 ug/dL (ref 42–145)
SATURATION RATIOS: 17.5 % — AB (ref 20.0–50.0)
TRANSFERRIN: 281 mg/dL (ref 212.0–360.0)

## 2018-07-06 LAB — BASIC METABOLIC PANEL
BUN: 7 mg/dL (ref 6–23)
CHLORIDE: 109 meq/L (ref 96–112)
CO2: 26 mEq/L (ref 19–32)
CREATININE: 0.82 mg/dL (ref 0.40–1.20)
Calcium: 9.3 mg/dL (ref 8.4–10.5)
GFR: 93.36 mL/min (ref >60.00)
Glucose, Bld: 73 mg/dL (ref 70–99)
POTASSIUM: 4.1 meq/L (ref 3.5–5.1)
Sodium: 141 mEq/L (ref 135–145)

## 2018-07-06 LAB — CBC
HCT: 40 % (ref 36.0–46.0)
Hemoglobin: 13.5 g/dL (ref 12.0–15.0)
MCHC: 33.8 g/dL (ref 30.0–36.0)
MCV: 91 fl (ref 78.0–100.0)
Platelets: 239 10*3/uL (ref 150.0–400.0)
RBC: 4.4 Mil/uL (ref 3.87–5.11)
RDW: 13 % (ref 11.5–15.5)
WBC: 5.5 10*3/uL (ref 4.0–10.5)

## 2018-07-06 LAB — TSH: TSH: 4.42 u[IU]/mL (ref 0.35–4.50)

## 2018-07-06 MED ORDER — CICLOPIROX OLAMINE 0.77 % EX CREA: TOPICAL_CREAM | Freq: Two times a day (BID) | CUTANEOUS | 0 refills | Status: DC

## 2018-07-06 NOTE — Assessment & Plan Note (Signed)
Present for the last one year.  Check CBC, IBC panel, BMP, TSH. Recommended regular exercise and healthy diet. Appears well today.

## 2018-07-06 NOTE — Progress Notes (Signed)
Subjective:    Patient ID: Victoria Carson, female    DOB: August 24, 1996, 22 y.o.   MRN: [id number]  HPI  [name] is a 22 year old female who presents today with a chief complaint of rash and fatigue.  1) Rash: Her rash is located to the right plantar foot which has been intermittent since late April 2019. During flares she will experience pain. She does experience daily itching. She's tried OTC Athlete's Foot spray for which she tried for two weeks without improvement.   2) Fatigue: Also with cold intolerance that has been present for the last one year. She endorses sleeping 9 hours every night but will feel sluggish and tired throughout the day. She denies palpitations, hair loss, anxiety/depression, dizziness. She has a family history of iron deficiency anemia in her mother, denies family history of thyroid disorders.   Review of Systems  Constitutional: Positive for fatigue. Negative for unexpected weight change.  Respiratory: Negative for shortness of breath.   Cardiovascular: Negative for chest pain and palpitations.  Skin: Positive for rash.  Neurological: Negative for dizziness.       Past Medical History:  Diagnosis Date  . Chronic cholecystitis 07/2016  . Dental crown present   . Difficulty swallowing pills   . Dust allergy   . GERD (gastroesophageal reflux disease)    no current med.  . IBS (irritable bowel syndrome)   . Migraines      Social History   Socioeconomic History  . Marital status: Single    Spouse name: n/a  . Number of children: 0  . Years of education: Not on file  . Highest education level: Not on file  Occupational History  . 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 Financial resource strain: Not on file  . Food insecurity:    Worry: Not on file    Inability: Not on file  . Transportation needs:    Medical: Not on file    Non-medical: Not on file  Tobacco Use  . Smoking status: Never Smoker  .  Smokeless tobacco: Never Used  Substance and Sexual Activity  . Alcohol use: No  . Drug use: No  . Sexual activity: Never    Partners: Male    Birth control/protection: Inserts  Lifestyle  . Physical activity:    Days per week: Not on file    Minutes per session: Not on file  . Stress: Not on file  Relationships  . Social connections:    Talks on phone: Not on file    Gets together: Not on file    Attends religious service: Not on file    Active member of club or organization: Not on file    Attends meetings of clubs or organizations: Not on file    Relationship status: Not on file  . Intimate partner violence:    Fear of current or ex partner: Not on file    Emotionally abused: Not on file    Physically abused: Not on file    Forced sexual activity: Not on file  Other Topics Concern  . Not on file  Social History Narrative   05/01/2013 AHW Macayla was poor in [place], [place].. She currently lives in [place] with her father, grandfather, older sister, and younger brother. She currently is homeschooled and in the 10th grade. She has a boyfriend of 2 months. 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 She enjoys Librarian, academic, and listening to  music. 2 years ago, she was caught shoplifting. She reports that her social support system consists of a friend. 05/01/2013 AHW      Single. Lives with father.    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   Enjoys spending time with her friends.                Past Surgical History:  Procedure Laterality Date  . CHOLECYSTECTOMY N/A 08/08/2016   Procedure: LAPAROSCOPIC CHOLECYSTECTOMY;  Surgeon: Coralie Keens, MD;  Location: [facility];  Service: General;  Laterality: N/A;  . WISDOM TOOTH EXTRACTION      Family History  Problem Relation Age of Onset  . Mental illness Mother   . Drug abuse Mother   . Heart failure Other   . Hypertension Other   . Depression Paternal Aunt   . Anxiety disorder Paternal Aunt     Allergies   Allergen Reactions  . Citrus Itching and Other (See Comments)    SWEATING  . Sulfa Antibiotics Nausea Only, Other (See Comments) and Nausea And Vomiting    FEVER    No current outpatient medications on file prior to visit.   No current facility-administered medications on file prior to visit.     BP 118/70 (BP Location: Left Arm, Patient Position: Sitting, Cuff Size: Normal)   Pulse 60   Temp 98.5 F (36.9 C) (Oral)   Ht 5\' 9"  (1.753 m)   Wt 135 lb 8 oz (61.5 kg)   LMP  (Within Months) Comment: About 3 mos ago  SpO2 99%   BMI 20.01 kg/m    Objective:   Physical Exam  Constitutional: She appears well-nourished.  Neck: Neck supple. No thyromegaly present.  Cardiovascular: Normal rate and regular rhythm.  Respiratory: Effort normal and breath sounds normal.  Musculoskeletal:       Feet:  Dry, mildly erythematous, scaly rash to right plantar foot. No infection or open wounds  Skin: Skin is warm and dry.  Psychiatric: She has a normal mood and affect.           Assessment & Plan:  Tinea Pedis:  Located to right plantar foot.  No improvement with OTC athletes spary. Rx for ciclopirox cream provided. She will update in 2 weeks.   Pleas Koch, NP

## 2018-07-06 NOTE — Patient Instructions (Addendum)
Apply the ciclopriox cream twice daily to the foot for 2-4 weeks. Please notify me if no improvement/resolve within 2 weeks.   Stop by the lab prior to leaving today. I will notify you of your results once received.   Ensure you are consuming 64 ounces of water daily.  It's important to improve your diet by reducing consumption of fast food, fried food, processed snack foods, sugary drinks. Increase consumption of fresh vegetables and fruits, whole grains, water.  Ensure you are drinking 64 ounces of water daily.  Start exercising. You should be getting 150 minutes of moderate intensity exercise weekly.   It was a pleasure to see you today!   Athlete's Foot Athlete's foot (tinea pedis) is a fungal infection of the skin on the feet. It often occurs on the skin that is between or underneath the toes. It can also occur on the soles of the feet. The infection can spread from person to person (is contagious). What are the causes? Athlete's foot is caused by a fungus. This fungus grows in warm, moist places. Most people get athlete's foot by sharing shower stalls, towels, and wet floors with someone who is infected. Not washing your feet or changing your socks often enough can contribute to athlete's foot. What increases the risk? This condition is more likely to develop in:  Men.  People who have a weak body defense system (immune system).  People who have diabetes.  People who use public showers, such as at a gym.  People who wear heavy-duty shoes, such as Environmental manager.  Seasons with warm, humid weather.  What are the signs or symptoms? Symptoms of this condition include:  Itchy areas between the toes or on the soles of the feet.  White, flaky, or scaly areas between the toes or on the soles of the feet.  Very itchy small blisters between the toes or on the soles of the feet.  Small cuts on the skin. These cuts can become infected.  Thick or discolored  toenails.  How is this diagnosed? This condition is diagnosed with a medical history and physical exam. Your health care provider may also take a skin or toenail sample to be examined. How is this treated? Treatment for this condition includes antifungal medicines. These may be applied as powders, ointments, or creams. In severe cases, an oral antifungal medicine may be given. Follow these instructions at home:  Apply or take over-the-counter and prescription medicines only as told by your health care provider.  Keep all follow-up visits as told by your health care provider. This is important.  Do not scratch your feet.  Keep your feet dry: ? Wear cotton or wool socks. Change your socks every day or if they become wet. ? Wear shoes that allow air to circulate, such as sandals or canvas tennis shoes.  Wash and dry your feet: ? Every day or as told by your health care provider. ? After exercising. ? Including the area between your toes.  Do not share towels, nail clippers, or other personal items that touch your feet with others.  If you have diabetes, keep your blood sugar under control. How is this prevented?  Do not share towels.  Wear sandals in wet areas, such as locker rooms and shared showers.  Keep your feet dry: ? Wear cotton or wool socks. Change your socks every day or if they become wet. ? Wear shoes that allow air to circulate, such as sandals or canvas tennis  shoes.  Wash and dry your feet after exercising. Pay attention to the area between your toes. Contact a health care provider if:  You have a fever.  You have swelling, soreness, warmth, or redness in your foot.  You are not getting better with treatment.  Your symptoms get worse.  You have new symptoms. This information is not intended to replace advice given to you by your health care provider. Make sure you discuss any questions you have with your health care provider. Document Released: 11/25/2000  Document Revised: 05/05/2016 Document Reviewed: 06/01/2015 Elsevier Interactive Patient Education  2018 [employer].

## 2018-09-18 ENCOUNTER — Ambulatory Visit (INDEPENDENT_AMBULATORY_CARE_PROVIDER_SITE_OTHER): Payer: 59 | Admitting: *Deleted

## 2018-09-18 DIAGNOSIS — Z3042 Encounter for surveillance of injectable contraceptive: Secondary | ICD-10-CM | POA: Diagnosis not present

## 2018-09-18 MED ORDER — MEDROXYPROGESTERONE ACETATE 150 MG/ML IM SUSP
150.0000 mg | Freq: Once | INTRAMUSCULAR | Status: AC
  Administered 2018-09-18: 150 mg via INTRAMUSCULAR

## 2018-09-18 NOTE — Progress Notes (Signed)
Per orders of Carlis Abbott, NP , injection of depo given by Modena Nunnery. Patient tolerated injection well.

## 2018-10-08 ENCOUNTER — Emergency Department (HOSPITAL_COMMUNITY)
Admission: EM | Admit: 2018-10-08 | Discharge: 2018-10-09 | Disposition: A | Discharge status: Home / Self Care | Payer: 59 | Attending: Emergency Medicine | Admitting: Emergency Medicine

## 2018-10-08 ENCOUNTER — Other Ambulatory Visit: Payer: Self-pay

## 2018-10-08 ENCOUNTER — Encounter (HOSPITAL_COMMUNITY): Payer: Self-pay | Admitting: Emergency Medicine

## 2018-10-08 DIAGNOSIS — N3090 Cystitis, unspecified without hematuria: Secondary | ICD-10-CM | POA: Insufficient documentation

## 2018-10-08 DIAGNOSIS — R300 Dysuria: Secondary | ICD-10-CM | POA: Diagnosis present

## 2018-10-08 LAB — CBC WITH DIFFERENTIAL/PLATELET
ABS IMMATURE GRANULOCYTES: 0.01 10*3/uL (ref 0.00–0.07)
Basophils Absolute: 0 10*3/uL (ref 0.0–0.1)
Basophils Relative: 1 %
Eosinophils Absolute: 0.2 10*3/uL (ref 0.0–0.5)
Eosinophils Relative: 2 %
HCT: 41.3 % (ref 36.0–46.0)
HEMOGLOBIN: 13.6 g/dL (ref 12.0–15.0)
IMMATURE GRANULOCYTES: 0 %
LYMPHS ABS: 2.4 10*3/uL (ref 0.7–4.0)
LYMPHS PCT: 39 %
MCH: 30.3 pg (ref 26.0–34.0)
MCHC: 32.9 g/dL (ref 30.0–36.0)
MCV: 92 fL (ref 80.0–100.0)
Monocytes Absolute: 0.6 10*3/uL (ref 0.1–1.0)
Monocytes Relative: 9 %
NEUTROS ABS: 3 10*3/uL (ref 1.7–7.7)
NEUTROS PCT: 49 %
PLATELETS: 158 10*3/uL (ref 150–400)
RBC: 4.49 MIL/uL (ref 3.87–5.11)
RDW: 12.3 % (ref 11.5–15.5)
WBC: 6.2 10*3/uL (ref 4.0–10.5)
nRBC: 0 % (ref 0.0–0.2)

## 2018-10-08 LAB — I-STAT BETA HCG BLOOD, ED (MC, WL, AP ONLY)

## 2018-10-08 LAB — URINALYSIS, ROUTINE W REFLEX MICROSCOPIC
BACTERIA UA: NONE SEEN
Bilirubin Urine: NEGATIVE
GLUCOSE, UA: NEGATIVE mg/dL
Ketones, ur: NEGATIVE mg/dL
NITRITE: NEGATIVE
PH: 5 (ref 5.0–8.0)
Protein, ur: 100 mg/dL — AB
SPECIFIC GRAVITY, URINE: 1.035 — AB (ref 1.005–1.030)
WBC, UA: >50 WBC/hpf — ABNORMAL HIGH (ref 0–5)

## 2018-10-08 LAB — I-STAT CHEM 8, ED
BUN: 9 mg/dL (ref 6–20)
CALCIUM ION: 1.23 mmol/L (ref 1.15–1.40)
CHLORIDE: 106 mmol/L (ref 98–111)
Creatinine, Ser: 0.8 mg/dL (ref 0.44–1.00)
Glucose, Bld: 88 mg/dL (ref 70–99)
HCT: 39 % (ref 36.0–46.0)
Hemoglobin: 13.3 g/dL (ref 12.0–15.0)
POTASSIUM: 3.5 mmol/L (ref 3.5–5.1)
Sodium: 142 mmol/L (ref 135–145)
TCO2: 24 mmol/L (ref 22–32)

## 2018-10-08 MED ORDER — PHENAZOPYRIDINE HCL 100 MG PO TABS
200.0000 mg | ORAL_TABLET | Freq: Once | ORAL | Status: AC
  Administered 2018-10-08: 200 mg via ORAL
  Filled 2018-10-08: qty 2

## 2018-10-08 NOTE — ED Triage Notes (Signed)
Patient with IC and having bladder spasms.  She states that it started around 6pm.  She is having urinary retention with the spasms.  No nausea or vomiting.

## 2018-10-09 MED ORDER — CEPHALEXIN 250 MG/5ML PO SUSR
500.0000 mg | Freq: Once | ORAL | Status: AC
  Administered 2018-10-09: 500 mg via ORAL
  Filled 2018-10-09: qty 10

## 2018-10-09 MED ORDER — CEPHALEXIN 250 MG/5ML PO SUSR: 250.0000 mg | Freq: Three times a day (TID) | ORAL | 0 refills | Status: AC

## 2018-10-09 MED ORDER — CEPHALEXIN 250 MG/5ML PO SUSR: 250.0000 mg | Freq: Three times a day (TID) | ORAL | 0 refills | Status: DC

## 2018-10-09 NOTE — ED Provider Notes (Signed)
[facility] EMERGENCY DEPARTMENT Provider Note  CSN: [id number] Arrival date & time: 10/08/18 2125  Chief Complaint(s) Dysuria  HPI Victoria Carson is a 22 y.o. female with a history of interstitial cystitis who presents to the emergency department with several hours of bladder spasming and dysuria as well as frequency.  No alleviating or aggravating factors.  She denies any associated fevers, chills, nausea, vomiting.  She denies any vaginal bleeding or discharge.    HPI  Past Medical History Past Medical History:  Diagnosis Date  . Chronic cholecystitis 07/2016  . Dental crown present   . Difficulty swallowing pills   . Dust allergy   . GERD (gastroesophageal reflux disease)    no current med.  . IBS (irritable bowel syndrome)   . Migraines    Patient Active Problem List   Diagnosis Date Noted  . Fatigue 07/06/2018  . Acne vulgaris 10/25/2017  . Interstitial cystitis 10/17/2016  . Encounter for birth control 10/17/2016  . MDD (major depressive disorder), recurrent episode, severe ([place]) 02/15/2013  . GAD (generalized anxiety disorder) 02/15/2013   Home Medication(s) Prior to Admission medications   Medication Sig Start Date End Date Taking? Authorizing Provider  cephALEXin (KEFLEX) 250 MG/5ML suspension Take 5 mLs (250 mg total) by mouth 3 (three) times daily for 5 days. 10/09/18 10/14/18  Fatima Blank, MD  ciclopirox (LOPROX) 0.77 % cream Apply topically 2 (two) times daily. 07/06/18   Pleas Koch, NP                                                                                                                                    Past Surgical History Past Surgical History:  Procedure Laterality Date  . CHOLECYSTECTOMY N/A 08/08/2016   Procedure: LAPAROSCOPIC CHOLECYSTECTOMY;  Surgeon: Coralie Keens, MD;  Location: [facility];  Service: General;  Laterality: N/A;  . WISDOM TOOTH EXTRACTION     Family History Family  History  Problem Relation Age of Onset  . Mental illness Mother   . Drug abuse Mother   . Heart failure Other   . Hypertension Other   . Depression Paternal Aunt   . Anxiety disorder Paternal Aunt     Social History Social History   Tobacco Use  . Smoking status: Never Smoker  . Smokeless tobacco: Never Used  Substance Use Topics  . Alcohol use: No  . Drug use: No   Allergies Citrus and Sulfa antibiotics  Review of Systems Review of Systems All other systems are reviewed and are negative for acute change except as noted in the HPI  Physical Exam Vital Signs  I have reviewed the triage vital signs BP 107/69   Pulse 92   Temp 98.8 F (37.1 C) (Oral)   Resp 15   Ht 5\' 8"  (1.727 m)   Wt 63.5 kg   LMP 09/17/2018 (Approximate)   SpO2 100%   BMI  21.29 kg/m   Physical Exam  Constitutional: She is oriented to person, place, and time. She appears well-developed and well-nourished. No distress.  HENT:  Head: Normocephalic and atraumatic.  Right Ear: External ear normal.  Left Ear: External ear normal.  Nose: Nose normal.  Eyes: Conjunctivae and EOM are normal. No scleral icterus.  Neck: Normal range of motion and phonation normal.  Cardiovascular: Normal rate and regular rhythm.  Pulmonary/Chest: Effort normal. No stridor. No respiratory distress.  Abdominal: She exhibits no distension. There is no tenderness. There is no rigidity, no rebound and no guarding.  Musculoskeletal: Normal range of motion. She exhibits no edema.  Neurological: She is alert and oriented to person, place, and time.  Skin: She is not diaphoretic.  Psychiatric: She has a normal mood and affect. Her behavior is normal.  Vitals reviewed.   ED Results and Treatments Labs (all labs ordered are listed, but only abnormal results are displayed) Labs Reviewed  URINALYSIS, ROUTINE W REFLEX MICROSCOPIC - Abnormal; Notable for the following components:      Result Value   APPearance HAZY (*)     Specific Gravity, Urine 1.035 (*)    Hgb urine dipstick MODERATE (*)    Protein, ur 100 (*)    Leukocytes, UA MODERATE (*)    WBC, UA >50 (*)    All other components within normal limits  URINE CULTURE  CBC WITH DIFFERENTIAL/PLATELET  I-STAT CHEM 8, ED  I-STAT BETA HCG BLOOD, ED ([facility], [facility], [facility] ONLY)                                                                                                                         EKG  EKG Interpretation  Date/Time:    Ventricular Rate:    PR Interval:    QRS Duration:   QT Interval:    QTC Calculation:   R Axis:     Text Interpretation:        Radiology No results found. Pertinent labs & imaging results that were available during my care of the patient were reviewed by me and considered in my medical decision making (see chart for details).  Medications Ordered in ED Medications  cephALEXin (KEFLEX) 250 MG/5ML suspension 500 mg (has no administration in time range)  phenazopyridine (PYRIDIUM) tablet 200 mg (200 mg Oral Given 10/08/18 2150)  Procedures Procedures  (including critical care time)  Medical Decision Making / ED Course I have reviewed the nursing notes for this encounter and the patient's prior records (if available in EHR or on provided paperwork).    Patient here with bladder spasms and urinary symptoms.  UA consistent with a urinary tract infection.  Beta hCG negative.  Other labs grossly reassuring without leukocytosis or anemia.  No significant electrolyte derangements or renal insufficiency.  Feel patient is appropriate for outpatient management.  Given first dose of Keflex here in the emergency department.  The patient appears reasonably screened and/or stabilized for discharge and I doubt any other medical condition or other [facility] requiring further screening, evaluation, or treatment in the  ED at this time prior to discharge.  The patient is safe for discharge with strict return precautions.   Final Clinical Impression(s) / ED Diagnoses Final diagnoses:  Cystitis   Disposition: Discharge  Condition: Good  I have discussed the results, Dx and Tx plan with the patient who expressed understanding and agree(s) with the plan. Discharge instructions discussed at great length. The patient was given strict return precautions who verbalized understanding of the instructions. No further questions at time of discharge.    ED Discharge Orders         Ordered    cephALEXin (KEFLEX) 250 MG/5ML suspension  3 times daily     10/09/18 0128           Follow Up: Pleas Koch, NP [place] [address] [phone]   in 3-5 days, If symptoms do not improve or  worsen      This chart was dictated using voice recognition software.  Despite best efforts to proofread,  errors can occur which can change the documentation meaning.   Fatima Blank, MD 10/09/18 [phone]

## 2018-10-09 NOTE — ED Notes (Signed)
Called pharmacy for [employer]

## 2018-10-10 LAB — URINE CULTURE: CULTURE: NO GROWTH

## 2018-10-25 ENCOUNTER — Emergency Department (HOSPITAL_COMMUNITY)
Admission: EM | Admit: 2018-10-25 | Discharge: 2018-10-25 | Disposition: A | Discharge status: Home / Self Care | Payer: 59 | Attending: Emergency Medicine | Admitting: Emergency Medicine

## 2018-10-25 ENCOUNTER — Encounter (HOSPITAL_COMMUNITY): Payer: Self-pay | Admitting: Emergency Medicine

## 2018-10-25 ENCOUNTER — Encounter: Payer: Self-pay | Admitting: Internal Medicine

## 2018-10-25 ENCOUNTER — Emergency Department (HOSPITAL_COMMUNITY): Payer: 59

## 2018-10-25 ENCOUNTER — Ambulatory Visit: Payer: 59 | Admitting: Internal Medicine

## 2018-10-25 ENCOUNTER — Telehealth: Payer: Self-pay

## 2018-10-25 VITALS — BP 102/64 | HR 87 | Temp 99.5°F | Wt 142.0 lb

## 2018-10-25 DIAGNOSIS — R1031 Right lower quadrant pain: Secondary | ICD-10-CM

## 2018-10-25 DIAGNOSIS — R1084 Generalized abdominal pain: Secondary | ICD-10-CM

## 2018-10-25 DIAGNOSIS — N3010 Interstitial cystitis (chronic) without hematuria: Secondary | ICD-10-CM | POA: Diagnosis not present

## 2018-10-25 DIAGNOSIS — R110 Nausea: Secondary | ICD-10-CM | POA: Diagnosis not present

## 2018-10-25 LAB — POC URINALSYSI DIPSTICK (AUTOMATED)
BILIRUBIN UA: NEGATIVE
GLUCOSE UA: NEGATIVE
Ketones, UA: NEGATIVE
LEUKOCYTES UA: NEGATIVE
NITRITE UA: NEGATIVE
PH UA: 6 (ref 5.0–8.0)
Protein, UA: NEGATIVE
Spec Grav, UA: 1.015 (ref 1.010–1.025)
Urobilinogen, UA: 0.2 E.U./dL

## 2018-10-25 LAB — URINALYSIS, ROUTINE W REFLEX MICROSCOPIC
BILIRUBIN URINE: NEGATIVE
GLUCOSE, UA: NEGATIVE mg/dL
Ketones, ur: 5 mg/dL — AB
NITRITE: NEGATIVE
PROTEIN: 30 mg/dL — AB
Specific Gravity, Urine: 1.025 (ref 1.005–1.030)
pH: 5 (ref 5.0–8.0)

## 2018-10-25 LAB — BASIC METABOLIC PANEL
ANION GAP: 7 (ref 5–15)
BUN: 5 mg/dL — ABNORMAL LOW (ref 6–20)
CHLORIDE: 112 mmol/L — AB (ref 98–111)
CO2: 20 mmol/L — AB (ref 22–32)
Calcium: 8.9 mg/dL (ref 8.9–10.3)
Creatinine, Ser: 0.79 mg/dL (ref 0.44–1.00)
GFR calc non Af Amer: >60 mL/min (ref >60)
GLUCOSE: 90 mg/dL (ref 70–99)
POTASSIUM: 3.7 mmol/L (ref 3.5–5.1)
Sodium: 139 mmol/L (ref 135–145)

## 2018-10-25 LAB — CBC WITH DIFFERENTIAL/PLATELET
Abs Immature Granulocytes: 0.01 10*3/uL (ref 0.00–0.07)
BASOS ABS: 0 10*3/uL (ref 0.0–0.1)
Basophils Relative: 1 %
EOS ABS: 0.2 10*3/uL (ref 0.0–0.5)
EOS PCT: 3 %
HEMATOCRIT: 39.2 % (ref 36.0–46.0)
HEMOGLOBIN: 12.7 g/dL (ref 12.0–15.0)
Immature Granulocytes: 0 %
LYMPHS ABS: 1.9 10*3/uL (ref 0.7–4.0)
Lymphocytes Relative: 41 %
MCH: 30.1 pg (ref 26.0–34.0)
MCHC: 32.4 g/dL (ref 30.0–36.0)
MCV: 92.9 fL (ref 80.0–100.0)
MONO ABS: 0.5 10*3/uL (ref 0.1–1.0)
Monocytes Relative: 10 %
NRBC: 0 % (ref 0.0–0.2)
Neutro Abs: 2.1 10*3/uL (ref 1.7–7.7)
Neutrophils Relative %: 45 %
Platelets: 240 10*3/uL (ref 150–400)
RBC: 4.22 MIL/uL (ref 3.87–5.11)
RDW: 12.2 % (ref 11.5–15.5)
WBC: 4.7 10*3/uL (ref 4.0–10.5)

## 2018-10-25 LAB — I-STAT BETA HCG BLOOD, ED (MC, WL, AP ONLY): I-stat hCG, quantitative: <5 m[IU]/mL (ref <5)

## 2018-10-25 MED ORDER — KETOROLAC TROMETHAMINE 30 MG/ML IJ SOLN
30.0000 mg | Freq: Once | INTRAMUSCULAR | Status: AC
  Administered 2018-10-25: 30 mg via INTRAMUSCULAR

## 2018-10-25 MED ORDER — KETOROLAC TROMETHAMINE 30 MG/ML IJ SOLN: 30.0000 mg | Freq: Once | INTRAMUSCULAR | Status: DC

## 2018-10-25 MED ORDER — ONDANSETRON 4 MG PO TBDP: ORAL_TABLET | ORAL | 0 refills | Status: DC

## 2018-10-25 MED ORDER — IOHEXOL 300 MG/ML  SOLN
100.0000 mL | Freq: Once | INTRAMUSCULAR | Status: AC | PRN
  Administered 2018-10-25: 100 mL via INTRAVENOUS

## 2018-10-25 NOTE — ED Notes (Signed)
Pt returned from xray

## 2018-10-25 NOTE — Addendum Note (Signed)
Addended by: Lurlean Nanny on: 10/25/2018 04:38 PM   Modules accepted: Orders

## 2018-10-25 NOTE — ED Notes (Signed)
Patient verbalizes understanding of discharge instructions. Opportunity for questioning and answers were provided. Armband removed by staff, pt discharged from ED ambulatory.   

## 2018-10-25 NOTE — ED Triage Notes (Signed)
Pt complaining of lower abdominal pain, constant, dull pain beginning yesterday. Comes and goes. No V/D, pt is having nausea.

## 2018-10-25 NOTE — Telephone Encounter (Signed)
Pt called for appt for lower abd pain started 10/24/18;nausea on and off. Pain is across lower abd;no particular spot of pain. Pain level now 6. ? Fever; No UTI symptoms, no diarrhea or constipation, no vomiting. Pt scheduled appt with R Baity NP 10/25/18 at 4:15. FYI to Avie Echevaria NP.

## 2018-10-25 NOTE — Progress Notes (Signed)
Subjective:    Patient ID: Victoria Carson, female    DOB: 05-15-1996, 22 y.o.   MRN: [id number]  HPI  Pt presents to the clinic today with c/o lower abdominal pain.  She reports this started yesterday. She reports it came on all of a sudden. She describes the pain as sore and achy, with intermittent sharp stabbing pains. The pain radiates up to the top of her abdomen. She has some associated nausea, but denies vomiting. The pain does not get better or worsen in relation to eating. Her bowels are moving normally. She denies urgency, frequency, dysuria or low back pain. She has a history of interstitial cystitis but reports this feels different. She was seen in the ER a few weeks ago, treated for a UTI with Keflex, although urine culture did not grow anything. She has taken AZO with minimal relief. She is currently on her menses but reports this doesn't feel like cramping associated with menses. Her bowels are moving normal. Pain does not improve after BM. She has not noticed any blood in her stool. She denies fever, chills or body aches.  Review of Systems  Past Medical History:  Diagnosis Date  . Chronic cholecystitis 07/2016  . Dental crown present   . Difficulty swallowing pills   . Dust allergy   . GERD (gastroesophageal reflux disease)    no current med.  . IBS (irritable bowel syndrome)   . Migraines     No current outpatient medications on file.   No current facility-administered medications for this visit.     Allergies  Allergen Reactions  . Citrus Itching and Other (See Comments)    SWEATING  . Sulfa Antibiotics Nausea Only, Other (See Comments) and Nausea And Vomiting    FEVER    Family History  Problem Relation Age of Onset  . Mental illness Mother   . Drug abuse Mother   . Heart failure Other   . Hypertension Other   . Depression Paternal Aunt   . Anxiety disorder Paternal Aunt     Social History   Socioeconomic History  . Marital status: Single   Spouse name: n/a  . Number of children: 0  . Years of education: Not on file  . Highest education level: Not on file  Occupational History  . 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 Financial resource strain: Not on file  . Food insecurity:    Worry: Not on file    Inability: Not on file  . Transportation needs:    Medical: Not on file    Non-medical: Not on file  Tobacco Use  . Smoking status: Never Smoker  . Smokeless tobacco: Never Used  Substance and Sexual Activity  . Alcohol use: No  . Drug use: No  . Sexual activity: Never    Partners: Male    Birth control/protection: Inserts  Lifestyle  . Physical activity:    Days per week: Not on file    Minutes per session: Not on file  . Stress: Not on file  Relationships  . Social connections:    Talks on phone: Not on file    Gets together: Not on file    Attends religious service: Not on file    Active member of club or organization: Not on file    Attends meetings of clubs or organizations: Not on file    Relationship status: Not on file  . Intimate partner violence:  Fear of current or ex partner: Not on file    Emotionally abused: Not on file    Physically abused: Not on file    Forced sexual activity: Not on file  Other Topics Concern  . Not on file  Social History Narrative   05/01/2013 AHW [name] was poor in [place], [place].. She currently lives in [place] with her father, grandfather, older sister, and younger brother. She currently is homeschooled and in the 10th grade. She has a boyfriend of 2 months. 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 2 years ago, she was caught shoplifting. She reports that her social support system consists of a friend. 05/01/2013 AHW      Single. Lives with father.    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   Enjoys spending time with her friends.                 Constitutional: Denies fever,  malaise, fatigue, headache or abrupt weight changes.  Respiratory: Denies difficulty breathing, shortness of breath, cough or sputum production.   Cardiovascular: Denies chest pain, chest tightness, palpitations or swelling in the hands or feet.  Gastrointestinal: Pt reports abdominal pain and nausea. Denies bloating, constipation, diarrhea or blood in the stool.  GU: Denies urgency, frequency, pain with urination, burning sensation, blood in urine, odor or discharge.  No other specific complaints in a complete review of systems (except as listed in HPI above).     Objective:   Physical Exam   BP 102/64   Pulse 87   Temp 99.5 F (37.5 C) (Oral)   Wt 142 lb (64.4 kg)   LMP 10/20/2018   SpO2 98%   BMI 21.59 kg/m  Wt Readings from Last 3 Encounters:  10/25/18 142 lb (64.4 kg)  10/08/18 140 lb (63.5 kg)  07/06/18 135 lb 8 oz (61.5 kg)    General: Appears her stated age, well developed, well nourished in NAD. Skin: Warm, dry and intact. No rashes noted. Cardiovascular: Normal rate and rhythm. S1,S2 noted.  No murmur, rubs or gallops noted.  Pulmonary/Chest: Normal effort and positive vesicular breath sounds. No respiratory distress. No wheezes, rales or ronchi noted.  Abdomen: Soft and generally tender. Normal bowel sounds. No distention or masses noted. Negative Murphy's. Negative Rebound. Positive Psoas.   BMET    Component Value Date/Time   NA 142 10/08/2018 2234   K 3.5 10/08/2018 2234   CL 106 10/08/2018 2234   CO2 26 07/06/2018 1058   GLUCOSE 88 10/08/2018 2234   BUN 9 10/08/2018 2234   CREATININE 0.80 10/08/2018 2234   CREATININE 0.51 01/02/2013 2124   CALCIUM 9.3 07/06/2018 1058   GFRNONAA >60 11/30/2016 1721   GFRAA >60 11/30/2016 1721    Lipid Panel  No results found for: CHOL, TRIG, HDL, CHOLHDL, VLDL, LDLCALC  CBC    Component Value Date/Time   WBC 6.2 10/08/2018 2207   RBC 4.49 10/08/2018 2207   HGB 13.3 10/08/2018 2234   HCT 39.0 10/08/2018 2234    PLT 158 10/08/2018 2207   MCV 92.0 10/08/2018 2207   MCV 85.4 09/10/2016 1635   MCH 30.3 10/08/2018 2207   MCHC 32.9 10/08/2018 2207   RDW 12.3 10/08/2018 2207   LYMPHSABS 2.4 10/08/2018 2207   MONOABS 0.6 10/08/2018 2207   EOSABS 0.2 10/08/2018 2207   BASOSABS 0.0 10/08/2018 2207    Hgb A1C No results found for: HGBA1C         Assessment &  Plan:   Generalized Abdominal Pain, Nausea:  Urinalysis: 3+ blood Will send urine culture She declines wet prep Will check CBC, CMET, Amylase and Lipase Toradol 30 mg IM today To ER if worse  Return precautions discussed, will follow up after labs are back Webb Silversmith, NP

## 2018-10-25 NOTE — Telephone Encounter (Signed)
noted 

## 2018-10-25 NOTE — ED Notes (Signed)
Patient transported to CT 

## 2018-10-25 NOTE — Addendum Note (Signed)
Addended by: Lurlean Nanny on: 10/25/2018 04:35 PM   Modules accepted: Orders

## 2018-10-25 NOTE — Patient Instructions (Signed)

## 2018-10-25 NOTE — ED Provider Notes (Signed)
[facility] EMERGENCY DEPARTMENT Provider Note   CSN: [id number] Arrival date & time: 10/25/18  1753     History   Chief Complaint Chief Complaint  Patient presents with  . Abdominal Pain  . Nausea    HPI Victoria Carson is a 22 y.o. female.  Patient complaining of lower abdominal pain onset yesterday. It is dull in nature, more pronounced in the right lower quadrant. Nauseated, no vomiting or diarrhea. Patient has history of ovarian cysts, has recently started her period. She was seen by her PCP today, received anti-inflammatory injection, no relief.  The history is provided by the patient. No language interpreter was used.  Abdominal Pain   This is a new problem. The current episode started yesterday. The problem occurs hourly. The problem has been gradually worsening. The pain is located in the RLQ, suprapubic region and LLQ. The quality of the pain is dull. The pain is moderate. Associated symptoms include nausea. Pertinent negatives include diarrhea and vomiting.    Past Medical History:  Diagnosis Date  . Chronic cholecystitis 07/2016  . Dental crown present   . Difficulty swallowing pills   . Dust allergy   . GERD (gastroesophageal reflux disease)    no current med.  . IBS (irritable bowel syndrome)   . Migraines     Patient Active Problem List   Diagnosis Date Noted  . Fatigue 07/06/2018  . Acne vulgaris 10/25/2017  . Interstitial cystitis 10/17/2016  . Encounter for birth control 10/17/2016  . MDD (major depressive disorder), recurrent episode, severe ([place]) 02/15/2013  . GAD (generalized anxiety disorder) 02/15/2013    Past Surgical History:  Procedure Laterality Date  . CHOLECYSTECTOMY N/A 08/08/2016   Procedure: LAPAROSCOPIC CHOLECYSTECTOMY;  Surgeon: Coralie Keens, MD;  Location: [facility];  Service: General;  Laterality: N/A;  . WISDOM TOOTH EXTRACTION       OB History   None      Home Medications     Prior to Admission medications   Not on File    Family History Family History  Problem Relation Age of Onset  . Mental illness Mother   . Drug abuse Mother   . Heart failure Other   . Hypertension Other   . Depression Paternal Aunt   . Anxiety disorder Paternal Aunt     Social History Social History   Tobacco Use  . Smoking status: Never Smoker  . Smokeless tobacco: Never Used  Substance Use Topics  . Alcohol use: No  . Drug use: No     Allergies   Citrus and Sulfa antibiotics   Review of Systems Review of Systems  Gastrointestinal: Positive for abdominal pain and nausea. Negative for diarrhea and vomiting.  All other systems reviewed and are negative.    Physical Exam Updated Vital Signs BP 117/74 (BP Location: Right Arm)   Pulse 68   Temp 98.4 F (36.9 C) (Oral)   Resp 16   LMP 10/20/2018   SpO2 100%   Physical Exam  Constitutional: She is oriented to person, place, and time. She appears well-developed and well-nourished. She does not appear ill.  HENT:  Head: Normocephalic.  Eyes: Conjunctivae are normal.  Cardiovascular: Normal rate and regular rhythm.  Pulmonary/Chest: Effort normal and breath sounds normal.  Abdominal: Bowel sounds are normal. There is tenderness in the right lower quadrant. There is no rebound.  Musculoskeletal: Normal range of motion.  Neurological: She is alert and oriented to person, place, and time.  Skin: Skin is warm and dry.  Psychiatric: She has a normal mood and affect.  Nursing note and vitals reviewed.    ED Treatments / Results  Labs (all labs ordered are listed, but only abnormal results are displayed) Labs Reviewed  BASIC METABOLIC PANEL - Abnormal; Notable for the following components:      Result Value   Chloride 112 (*)    CO2 20 (*)    BUN 5 (*)    All other components within normal limits  URINALYSIS, ROUTINE W REFLEX MICROSCOPIC - Abnormal; Notable for the following components:   APPearance HAZY  (*)    Hgb urine dipstick LARGE (*)    Ketones, ur 5 (*)    Protein, ur 30 (*)    Leukocytes, UA TRACE (*)    Bacteria, UA RARE (*)    Non Squamous Epithelial 0-5 (*)    All other components within normal limits  CBC WITH DIFFERENTIAL/PLATELET  I-STAT BETA HCG BLOOD, ED ([facility], [facility], [facility] ONLY)    EKG None  Radiology Ct Abdomen Pelvis W Contrast  Result Date: 10/25/2018 CLINICAL DATA:  Lower abdominal pain beginning yesterday. EXAM: CT ABDOMEN AND PELVIS WITH CONTRAST TECHNIQUE: Multidetector CT imaging of the abdomen and pelvis was performed using the standard protocol following bolus administration of intravenous contrast. CONTRAST:  178mL OMNIPAQUE IOHEXOL 300 MG/ML  SOLN COMPARISON:  09/11/2016 FINDINGS: Lower chest: No acute abnormality. Hepatobiliary: No focal liver abnormality is seen. Status post cholecystectomy. No biliary dilatation. Pancreas: Unremarkable. No pancreatic ductal dilatation or surrounding inflammatory changes. Spleen: Normal in size without focal abnormality. Adrenals/Urinary Tract: Adrenal glands are unremarkable. Kidneys are normal, without renal calculi, focal lesion, or hydronephrosis. Bladder is unremarkable. Stomach/Bowel: Stomach is within normal limits. Appendix appears normal. No evidence of bowel wall thickening, distention, or inflammatory changes. Vascular/Lymphatic: No significant vascular findings are present. No enlarged abdominal or pelvic lymph nodes. Reproductive: Uterus and bilateral adnexa are unremarkable. Physiologic sized follicles are noted within both ovaries. Other: No abdominal wall hernia or abnormality. No abdominopelvic ascites. Musculoskeletal: No acute or significant osseous findings. IMPRESSION: 1. No acute abnormality noted. 2. Cholecystectomy. 3. No acute bowel obstruction or inflammation.  Normal appendix Electronically Signed   By: Ashley Royalty M.D.   On: 10/25/2018 21:21    Procedures Procedures (including critical care  time)  Medications Ordered in ED Medications  iohexol (OMNIPAQUE) 300 MG/ML solution 100 mL (100 mLs Intravenous Contrast Given 10/25/18 2039)     Initial Impression / Assessment and Plan / ED Course  I have reviewed the triage vital signs and the nursing notes.  Pertinent labs & imaging results that were available during my care of the patient were reviewed by me and considered in my medical decision making (see chart for details).     Patient is nontoxic, nonseptic appearing, in no apparent distress.  Patient's pain and other symptoms adequately managed in emergency department.  Labs, imaging and vitals reviewed.  Patient does not meet the SIRS or Sepsis criteria.  On repeat exam patient does not have a surgical abdomin and there are no peritoneal signs.  No indication of appendicitis, bowel obstruction, bowel perforation, cholecystitis, diverticulitis, PID or ectopic pregnancy.  Patient discharged home with symptomatic treatment and given strict instructions for follow-up with their primary care physician.  I have also discussed reasons to return immediately to the ER.  Patient expresses understanding and agrees with plan.    Final Clinical Impressions(s) / ED Diagnoses   Final diagnoses:  Right  lower quadrant abdominal pain    ED Discharge Orders         Ordered    ondansetron (ZOFRAN ODT) 4 MG disintegrating tablet     10/25/18 2157           Etta Quill, NP 10/25/18 2247    Lacretia Leigh, MD 10/29/18 1329

## 2018-10-26 LAB — CBC
HEMATOCRIT: 40.8 % (ref 36.0–46.0)
HEMOGLOBIN: 13.9 g/dL (ref 12.0–15.0)
MCHC: 34.1 g/dL (ref 30.0–36.0)
MCV: 91.3 fl (ref 78.0–100.0)
Platelets: 250 10*3/uL (ref 150.0–400.0)
RBC: 4.47 Mil/uL (ref 3.87–5.11)
RDW: 12.9 % (ref 11.5–15.5)
WBC: 4.4 10*3/uL (ref 4.0–10.5)

## 2018-10-26 LAB — COMPREHENSIVE METABOLIC PANEL
ALBUMIN: 4.5 g/dL (ref 3.5–5.2)
ALK PHOS: 65 U/L (ref 39–117)
ALT: 9 U/L (ref 0–35)
AST: 12 U/L (ref 0–37)
BUN: 7 mg/dL (ref 6–23)
CALCIUM: 9.2 mg/dL (ref 8.4–10.5)
CO2: 25 mEq/L (ref 19–32)
Chloride: 109 mEq/L (ref 96–112)
Creatinine, Ser: 0.81 mg/dL (ref 0.40–1.20)
GFR: 94.42 mL/min (ref >60.00)
Glucose, Bld: 98 mg/dL (ref 70–99)
POTASSIUM: 4 meq/L (ref 3.5–5.1)
Sodium: 140 mEq/L (ref 135–145)
TOTAL PROTEIN: 7.1 g/dL (ref 6.0–8.3)
Total Bilirubin: 0.7 mg/dL (ref 0.2–1.2)

## 2018-10-26 LAB — AMYLASE: AMYLASE: 46 U/L (ref 27–131)

## 2018-10-26 LAB — URINE CULTURE
MICRO NUMBER: 91373151
SPECIMEN QUALITY:: ADEQUATE

## 2018-10-26 LAB — LIPASE: Lipase: 20 U/L (ref 11.0–59.0)

## 2018-10-29 ENCOUNTER — Emergency Department (HOSPITAL_COMMUNITY)
Admission: EM | Admit: 2018-10-29 | Discharge: 2018-10-29 | Disposition: A | Discharge status: Home / Self Care | Payer: 59 | Attending: Emergency Medicine | Admitting: Emergency Medicine

## 2018-10-29 ENCOUNTER — Encounter (HOSPITAL_COMMUNITY): Payer: Self-pay | Admitting: Emergency Medicine

## 2018-10-29 DIAGNOSIS — R638 Other symptoms and signs concerning food and fluid intake: Secondary | ICD-10-CM | POA: Diagnosis not present

## 2018-10-29 DIAGNOSIS — K5900 Constipation, unspecified: Secondary | ICD-10-CM | POA: Diagnosis not present

## 2018-10-29 DIAGNOSIS — R110 Nausea: Secondary | ICD-10-CM | POA: Diagnosis not present

## 2018-10-29 DIAGNOSIS — R1030 Lower abdominal pain, unspecified: Secondary | ICD-10-CM | POA: Diagnosis not present

## 2018-10-29 DIAGNOSIS — R109 Unspecified abdominal pain: Secondary | ICD-10-CM | POA: Diagnosis present

## 2018-10-29 LAB — CBC
HEMATOCRIT: 41.3 % (ref 36.0–46.0)
Hemoglobin: 13.4 g/dL (ref 12.0–15.0)
MCH: 30.8 pg (ref 26.0–34.0)
MCHC: 32.4 g/dL (ref 30.0–36.0)
MCV: 94.9 fL (ref 80.0–100.0)
Platelets: 239 10*3/uL (ref 150–400)
RBC: 4.35 MIL/uL (ref 3.87–5.11)
RDW: 12.1 % (ref 11.5–15.5)
WBC: 4.4 10*3/uL (ref 4.0–10.5)
nRBC: 0 % (ref 0.0–0.2)

## 2018-10-29 LAB — WET PREP, GENITAL
Clue Cells Wet Prep HPF POC: NONE SEEN
SPERM: NONE SEEN
TRICH WET PREP: NONE SEEN
Yeast Wet Prep HPF POC: NONE SEEN

## 2018-10-29 LAB — I-STAT BETA HCG BLOOD, ED (MC, WL, AP ONLY)

## 2018-10-29 LAB — URINALYSIS, ROUTINE W REFLEX MICROSCOPIC
Bilirubin Urine: NEGATIVE
GLUCOSE, UA: NEGATIVE mg/dL
KETONES UR: NEGATIVE mg/dL
Nitrite: NEGATIVE
PROTEIN: NEGATIVE mg/dL
Specific Gravity, Urine: 1.01 (ref 1.005–1.030)
pH: 5 (ref 5.0–8.0)

## 2018-10-29 LAB — COMPREHENSIVE METABOLIC PANEL
ALBUMIN: 4.5 g/dL (ref 3.5–5.0)
ALT: 12 U/L (ref 0–44)
AST: 17 U/L (ref 15–41)
Alkaline Phosphatase: 69 U/L (ref 38–126)
Anion gap: 7 (ref 5–15)
BILIRUBIN TOTAL: 0.7 mg/dL (ref 0.3–1.2)
BUN: 9 mg/dL (ref 6–20)
CHLORIDE: 111 mmol/L (ref 98–111)
CO2: 24 mmol/L (ref 22–32)
Calcium: 9.2 mg/dL (ref 8.9–10.3)
Creatinine, Ser: 0.77 mg/dL (ref 0.44–1.00)
GFR calc Af Amer: >60 mL/min (ref >60)
GFR calc non Af Amer: >60 mL/min (ref >60)
Glucose, Bld: 124 mg/dL — ABNORMAL HIGH (ref 70–99)
POTASSIUM: 3.6 mmol/L (ref 3.5–5.1)
SODIUM: 142 mmol/L (ref 135–145)
TOTAL PROTEIN: 7.1 g/dL (ref 6.5–8.1)

## 2018-10-29 LAB — LIPASE, BLOOD: Lipase: 35 U/L (ref 11–51)

## 2018-10-29 MED ORDER — DICYCLOMINE HCL 20 MG PO TABS: 20.0000 mg | ORAL_TABLET | Freq: Three times a day (TID) | ORAL | 0 refills | Status: DC | PRN

## 2018-10-29 MED ORDER — DICYCLOMINE HCL 10 MG PO CAPS
10.0000 mg | ORAL_CAPSULE | Freq: Once | ORAL | Status: AC
  Administered 2018-10-29: 10 mg via ORAL
  Filled 2018-10-29: qty 1

## 2018-10-29 NOTE — ED Triage Notes (Signed)
Pt c/o abd pains with nausea for 5 days. Reports bloating and no BM in 5 days. Denies vomiting or urinary problems.

## 2018-10-29 NOTE — Discharge Instructions (Addendum)
Drink plenty of fluids.  Take colace and miralax tonight.

## 2018-10-29 NOTE — ED Provider Notes (Signed)
[facility] DEPT Provider Note   CSN: [id number] Arrival date & time: 10/29/18  1449     History   Chief Complaint Chief Complaint  Patient presents with  . Abdominal Pain    HPI Victoria Carson is a 22 y.o. female.  The history is provided by the patient. No language interpreter was used.  Abdominal Pain     Victoria Carson is a 22 y.o. female who presents to the Emergency Department complaining of abdominal pain. She presents to the emergency department for evaluation of five days of abdominal pain and nausea. Symptoms began abruptly five days ago with sudden onset abdominal pain from her central abdomen wrapping to both sides. She since that time has experienced nausea with decreased oral intake as well as constipation. Last bowel movement was five days ago. No reports of fevers, dysuria, vaginally discharge. She was evaluated in the emergency department five days ago and had labs and imaging performed at that time. Past Medical History:  Diagnosis Date  . Chronic cholecystitis 07/2016  . Dental crown present   . Difficulty swallowing pills   . Dust allergy   . GERD (gastroesophageal reflux disease)    no current med.  . IBS (irritable bowel syndrome)   . Migraines     Patient Active Problem List   Diagnosis Date Noted  . Fatigue 07/06/2018  . Acne vulgaris 10/25/2017  . Interstitial cystitis 10/17/2016  . Encounter for birth control 10/17/2016  . MDD (major depressive disorder), recurrent episode, severe ([place]) 02/15/2013  . GAD (generalized anxiety disorder) 02/15/2013    Past Surgical History:  Procedure Laterality Date  . CHOLECYSTECTOMY N/A 08/08/2016   Procedure: LAPAROSCOPIC CHOLECYSTECTOMY;  Surgeon: Coralie Keens, MD;  Location: [facility];  Service: General;  Laterality: N/A;  . WISDOM TOOTH EXTRACTION       OB History   None      Home Medications    Prior to Admission medications     Medication Sig Start Date End Date Taking? Authorizing Provider  medroxyPROGESTERone (DEPO-PROVERA) 150 MG/ML injection Inject 150 mg into the muscle every 3 (three) months.   Yes [provider]  ondansetron (ZOFRAN ODT) 4 MG disintegrating tablet 4mg  ODT q6 hours prn nausea/vomit Patient not taking: Reported on 10/29/2018 10/25/18   Etta Quill, NP    Family History Family History  Problem Relation Age of Onset  . Mental illness Mother   . Drug abuse Mother   . Heart failure Other   . Hypertension Other   . Depression Paternal Aunt   . Anxiety disorder Paternal Aunt     Social History Social History   Tobacco Use  . Smoking status: Never Smoker  . Smokeless tobacco: Never Used  Substance Use Topics  . Alcohol use: Yes  . Drug use: No     Allergies   Citrus and Sulfa antibiotics   Review of Systems Review of Systems  Gastrointestinal: Positive for abdominal pain.  All other systems reviewed and are negative.    Physical Exam Updated Vital Signs BP 106/67   Pulse 64   Temp 98.8 F (37.1 C)   Resp 18   Ht 5\' 9"  (1.753 m)   Wt 63.5 kg   LMP 10/20/2018   SpO2 100%   BMI 20.67 kg/m   Physical Exam  Constitutional: She is oriented to person, place, and time. She appears well-developed and well-nourished.  HENT:  Head: Normocephalic and atraumatic.  Cardiovascular: Regular rhythm.  No murmur heard. Tachycardic  Pulmonary/Chest: Effort normal and breath sounds normal. No respiratory distress.  Abdominal: Soft. There is no rebound and no guarding.  Mild lower abdominal tenderness without guarding or rebound  Genitourinary:  Genitourinary Comments: Scant vaginally discharge. Mild suprapubic tenderness on bimanual examination. No CMT or adnexal tenderness.  Musculoskeletal: She exhibits no edema or tenderness.  Neurological: She is alert and oriented to person, place, and time.  Skin: Skin is warm and dry.  Psychiatric: She has a normal mood and  affect. Her behavior is normal.  Nursing note and vitals reviewed.    ED Treatments / Results  Labs (all labs ordered are listed, but only abnormal results are displayed) Labs Reviewed  WET PREP, GENITAL - Abnormal; Notable for the following components:      Result Value   WBC, Wet Prep HPF POC RARE (*)    All other components within normal limits  COMPREHENSIVE METABOLIC PANEL - Abnormal; Notable for the following components:   Glucose, Bld 124 (*)    All other components within normal limits  URINALYSIS, ROUTINE W REFLEX MICROSCOPIC - Abnormal; Notable for the following components:   Hgb urine dipstick MODERATE (*)    Leukocytes, UA MODERATE (*)    Bacteria, UA RARE (*)    All other components within normal limits  LIPASE, BLOOD  CBC  I-STAT BETA HCG BLOOD, ED ([facility], [facility], [facility] ONLY)  GC/CHLAMYDIA PROBE AMP ([place]) NOT AT [facility]    EKG None  Radiology No results found.  Procedures Procedures (including critical care time)  Medications Ordered in ED Medications  dicyclomine (BENTYL) capsule 10 mg (10 mg Oral Given 10/29/18 1636)     Initial Impression / Assessment and Plan / ED Course  I have reviewed the triage vital signs and the nursing notes.  Pertinent labs & imaging results that were available during my care of the patient were reviewed by me and considered in my medical decision making (see chart for details).     Patient here for evaluation of abdominal pain, constipation. She has mild tenderness on examination with no peritoneal findings. Reviewed records from recent emergency department visit. She had labs and CT performed at that time. Current presentation is not consistent with appendicitis, tube ovarian abscess, PID, bowel obstruction. Counseled patient on home care for constipation as well as G.I. and PCP follow-up.  Final Clinical Impressions(s) / ED Diagnoses   Final diagnoses:  Lower abdominal pain  Constipation, unspecified constipation type     ED Discharge Orders    None       Quintella Reichert, MD 10/29/18 [phone]

## 2018-10-30 LAB — GC/CHLAMYDIA PROBE AMP (~~LOC~~) NOT AT ARMC
Chlamydia: NEGATIVE
Neisseria Gonorrhea: NEGATIVE

## 2018-10-31 ENCOUNTER — Telehealth: Payer: Self-pay

## 2018-10-31 NOTE — Telephone Encounter (Signed)
Patient was seen in the ER on 11/18 for ongoing abdominal pain and constipation.   She still has not had a BM and is continuing with ongoing abdominal discomfort.  She would like to be seen as soon as possible.  ER recommended f/u in 3 days.  Allie Bossier, NP consulted as she is out of the office this afternoon and no openings for tomorrow.  Avie Echevaria, NP has some openings and Victoria Carson is okay with Victoria Carson following up with patient tomorrow.   Avie Echevaria, NP aware and in agreement with seeing patient in the am at 845 for a 30 min appointment.  Patient aware and also knows that if symptoms increase or she develops more severe pain, vomiting or evidence of bleeding then she should go back to the ER asap.  Patient verbalizes understanding.

## 2018-11-01 ENCOUNTER — Encounter: Payer: Self-pay | Admitting: Internal Medicine

## 2018-11-01 ENCOUNTER — Ambulatory Visit: Payer: 59 | Admitting: Internal Medicine

## 2018-11-01 ENCOUNTER — Ambulatory Visit (INDEPENDENT_AMBULATORY_CARE_PROVIDER_SITE_OTHER)
Admission: RE | Admit: 2018-11-01 | Discharge: 2018-11-01 | Disposition: A | Discharge status: Home / Self Care | Payer: 59 | Source: Ambulatory Visit | Attending: Internal Medicine | Admitting: Internal Medicine

## 2018-11-01 VITALS — BP 106/68 | HR 82 | Temp 99.3°F | Wt 142.0 lb

## 2018-11-01 DIAGNOSIS — R110 Nausea: Secondary | ICD-10-CM

## 2018-11-01 DIAGNOSIS — R1084 Generalized abdominal pain: Secondary | ICD-10-CM

## 2018-11-01 DIAGNOSIS — K5900 Constipation, unspecified: Secondary | ICD-10-CM

## 2018-11-01 MED ORDER — LINACLOTIDE 72 MCG PO CAPS: 72.0000 ug | ORAL_CAPSULE | Freq: Every day | ORAL | 0 refills | Status: DC

## 2018-11-01 NOTE — Patient Instructions (Signed)

## 2018-11-01 NOTE — Progress Notes (Signed)
Subjective:    Patient ID: Victoria Carson, female    DOB: 1996-07-01, 22 y.o.   MRN: [id number]  HPI  Pt presents to the clinic today for multiple ER Follow Up:  10/28: To ER for urinary frequency, dysuria and bladder spasms. Dx with UTI. Treated with Keflex. Urine culture negative.  11/14: ER follow up with me. CBC, CMET, Amylase, Lipase normal. Urinalysis and culture negative. Hx of interstitial cystitis. Advised to follow up with PCP.  11/14: Went back to ER after appoint with me. C/o nausea. CT abdomen/pelvis negative for acute findings. She was treated with Zofran and advised to follow up with PCP.  11/18: Back to ER with c/o nausea, abdominal pain and constipation. Labs unremarkable. Dx with constipation. RX given for Bentyl.  Since discharge, she reports persistent abdominal pain. She reports associated nausea, constipation but denies vomiting or blood in her stool. She is passing gas. The pain is worse after eating. She denies recent in changes or diet. She has had some constipation in the past but not this severe. Her last BM was 10/24/2018. She has been taking Colace and Miralax TID without any relief. She is not taking the Bentyl as prescribed. She does have a GI Doctor but has not called to get an appt with them.   Review of Systems   Past Medical History:  Diagnosis Date  . Chronic cholecystitis 07/2016  . Dental crown present   . Difficulty swallowing pills   . Dust allergy   . GERD (gastroesophageal reflux disease)    no current med.  . IBS (irritable bowel syndrome)   . Migraines     Current Outpatient Medications  Medication Sig Dispense Refill  . dicyclomine (BENTYL) 20 MG tablet Take 1 tablet (20 mg total) by mouth 3 (three) times daily with meals as needed for spasms. 15 tablet 0  . medroxyPROGESTERone (DEPO-PROVERA) 150 MG/ML injection Inject 150 mg into the muscle every 3 (three) months.    . ondansetron (ZOFRAN ODT) 4 MG disintegrating tablet 4mg  ODT  q6 hours prn nausea/vomit (Patient not taking: Reported on 10/29/2018) 4 tablet 0   No current facility-administered medications for this visit.     Allergies  Allergen Reactions  . Citrus Itching and Other (See Comments)    SWEATING  . Sulfa Antibiotics Nausea Only, Other (See Comments) and Nausea And Vomiting    FEVER    Family History  Problem Relation Age of Onset  . Mental illness Mother   . Drug abuse Mother   . Heart failure Other   . Hypertension Other   . Depression Paternal Aunt   . Anxiety disorder Paternal Aunt     Social History   Socioeconomic History  . Marital status: Single    Spouse name: n/a  . Number of children: 0  . Years of education: Not on file  . Highest education level: Not on file  Occupational History  . 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 Financial resource strain: Not on file  . Food insecurity:    Worry: Not on file    Inability: Not on file  . Transportation needs:    Medical: Not on file    Non-medical: Not on file  Tobacco Use  . Smoking status: Never Smoker  . Smokeless tobacco: Never Used  Substance and Sexual Activity  . Alcohol use: Yes  . Drug use: No  . Sexual activity: Never  Partners: Male    Birth control/protection: Inserts  Lifestyle  . Physical activity:    Days per week: Not on file    Minutes per session: Not on file  . Stress: Not on file  Relationships  . Social connections:    Talks on phone: Not on file    Gets together: Not on file    Attends religious service: Not on file    Active member of club or organization: Not on file    Attends meetings of clubs or organizations: Not on file    Relationship status: Not on file  . Intimate partner violence:    Fear of current or ex partner: Not on file    Emotionally abused: Not on file    Physically abused: Not on file    Forced sexual activity: Not on file  Other Topics Concern  . Not on file  Social History  Narrative   05/01/2013 AHW Sarah-Jane was poor in [place], [place].. She currently lives in [place] with her father, grandfather, older sister, and younger brother. She currently is homeschooled and in the 10th grade. She has a boyfriend of 2 months. 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 2 years ago, she was caught shoplifting. She reports that her social support system consists of a friend. 05/01/2013 AHW      Single. Lives with father.    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   Enjoys spending time with her friends.                 Constitutional: Denies fever, malaise, fatigue, headache or abrupt weight changes.  HEENT: Denies eye pain, eye redness, ear pain, ringing in the ears, wax buildup, runny nose, nasal congestion, bloody nose, or sore throat. Respiratory: Denies difficulty breathing, shortness of breath, cough or sputum production.   Cardiovascular: Denies chest pain, chest tightness, palpitations or swelling in the hands or feet.  Gastrointestinal: Pt reports nausea, abdominal pain and constipation. Denies bloating, diarrhea or blood in the stool.  GU: Denies urgency, frequency, pain with urination, burning sensation, blood in urine, odor or discharge.  No other specific complaints in a complete review of systems (except as listed in HPI above).  Objective:   Physical Exam BP 106/68   Pulse 82   Temp 99.3 F (37.4 C) (Oral)   Wt 142 lb (64.4 kg)   LMP 10/20/2018   SpO2 98%   BMI 20.97 kg/m  Wt Readings from Last 3 Encounters:  11/01/18 142 lb (64.4 kg)  10/29/18 140 lb (63.5 kg)  10/25/18 142 lb (64.4 kg)    General: Appears her stated age, well developed, well nourished in NAD. Cardiovascular: Normal rate and rhythm. S1,S2 noted.  No murmur, rubs or gallops noted.  Pulmonary/Chest: Normal effort and positive vesicular breath sounds. No respiratory distress. No wheezes, rales or ronchi noted.  Abdomen: Soft and  generally tender. Normal bowel sounds. No distention or masses noted.   BMET    Component Value Date/Time   NA 142 10/29/2018 1511   K 3.6 10/29/2018 1511   CL 111 10/29/2018 1511   CO2 24 10/29/2018 1511   GLUCOSE 124 (H) 10/29/2018 1511   BUN 9 10/29/2018 1511   CREATININE 0.77 10/29/2018 1511   CREATININE 0.51 01/02/2013 2124   CALCIUM 9.2 10/29/2018 1511   GFRNONAA >60 10/29/2018 1511   GFRAA >60 10/29/2018 1511    Lipid Panel  No results found for: CHOL, TRIG, HDL, CHOLHDL, VLDL,  [place]  CBC    Component Value Date/Time   WBC 4.4 10/29/2018 1511   RBC 4.35 10/29/2018 1511   HGB 13.4 10/29/2018 1511   HCT 41.3 10/29/2018 1511   PLT 239 10/29/2018 1511   MCV 94.9 10/29/2018 1511   MCV 85.4 09/10/2016 1635   MCH 30.8 10/29/2018 1511   MCHC 32.4 10/29/2018 1511   RDW 12.1 10/29/2018 1511   LYMPHSABS 1.9 10/25/2018 1937   MONOABS 0.5 10/25/2018 1937   EOSABS 0.2 10/25/2018 1937   BASOSABS 0.0 10/25/2018 1937    Hgb A1C No results found for: HGBA1C           Assessment & Plan:   ER Follow Up for Abdominal Pain, Nausea and Constipation:  She is refusing rectal exam to check for impaction KUB ordered today Labs reviewed, no indication for additional labs at this time Discussed adequate water and fiber intake Will trial Linzess 72 mcg daily If no results in 24 hours, do Fleets enema up to 2 times Advised her to make an appt with GI for further evaluation  Return precautions discussed Webb Silversmith, NP

## 2018-12-08 ENCOUNTER — Emergency Department (HOSPITAL_COMMUNITY)
Admission: EM | Admit: 2018-12-08 | Discharge: 2018-12-08 | Disposition: A | Discharge status: Home / Self Care | Payer: 59 | Attending: Emergency Medicine | Admitting: Emergency Medicine

## 2018-12-08 ENCOUNTER — Encounter (HOSPITAL_COMMUNITY): Payer: Self-pay | Admitting: *Deleted

## 2018-12-08 DIAGNOSIS — J101 Influenza due to other identified influenza virus with other respiratory manifestations: Secondary | ICD-10-CM | POA: Diagnosis not present

## 2018-12-08 DIAGNOSIS — R6889 Other general symptoms and signs: Secondary | ICD-10-CM

## 2018-12-08 DIAGNOSIS — J029 Acute pharyngitis, unspecified: Secondary | ICD-10-CM | POA: Diagnosis present

## 2018-12-08 MED ORDER — BENZONATATE 100 MG PO CAPS: 100.0000 mg | ORAL_CAPSULE | Freq: Three times a day (TID) | ORAL | 0 refills | Status: DC | PRN

## 2018-12-08 NOTE — ED Provider Notes (Signed)
Victoria Carson Provider Note   CSN: [id number] Arrival date & time: 12/08/18  8527     History   Chief Complaint Chief Complaint  Patient presents with  . Generalized Body Aches  . Sore Throat  . Cough    HPI Victoria Carson is a 22 y.o. female presenting to the ED with complaint of URI symptoms.  Patient states on 12/05/2018, she developed a sore throat and since that time she is been having gradually worsening symptoms consisting of a fever that began yesterday, T-max 100.3 F.  She is also had some nasal congestion and a dry cough.  She states her worst symptom is the generalized body aches.  She has been treating with DayQuil, last dose yesterday, without relief.  No aggravating or alleviating factors.  No sick contacts.  Has not had influenza vaccine this year, however is up-to-date on childhood immunizations.  The history is provided by the patient.    Past Medical History:  Diagnosis Date  . Chronic cholecystitis 07/2016  . Dental crown present   . Difficulty swallowing pills   . Dust allergy   . GERD (gastroesophageal reflux disease)    no current med.  . IBS (irritable bowel syndrome)   . Migraines     Patient Active Problem List   Diagnosis Date Noted  . Fatigue 07/06/2018  . Acne vulgaris 10/25/2017  . Interstitial cystitis 10/17/2016  . Encounter for birth control 10/17/2016  . MDD (major depressive disorder), recurrent episode, severe ([place]) 02/15/2013  . GAD (generalized anxiety disorder) 02/15/2013    Past Surgical History:  Procedure Laterality Date  . CHOLECYSTECTOMY N/A 08/08/2016   Procedure: LAPAROSCOPIC CHOLECYSTECTOMY;  Surgeon: Coralie Keens, MD;  Location: [facility];  Service: General;  Laterality: N/A;  . WISDOM TOOTH EXTRACTION       OB History   No obstetric history on file.      Home Medications    Prior to Admission medications   Medication Sig Start Date End Date Taking?  Authorizing Provider  medroxyPROGESTERone (DEPO-PROVERA) 150 MG/ML injection Inject 150 mg into the muscle every 3 (three) months.   Yes [provider]  benzonatate (TESSALON) 100 MG capsule Take 1 capsule (100 mg total) by mouth 3 (three) times daily as needed for cough. 12/08/18   , [place] N, PA-C  linaclotide [facility]) 72 MCG capsule Take 1 capsule (72 mcg total) by mouth daily before breakfast. Patient not taking: Reported on 12/08/2018 11/01/18   Jearld Fenton, NP    Family History Family History  Problem Relation Age of Onset  . Mental illness Mother   . Drug abuse Mother   . Heart failure Other   . Hypertension Other   . Depression Paternal Aunt   . Anxiety disorder Paternal Aunt     Social History Social History   Tobacco Use  . Smoking status: Never Smoker  . Smokeless tobacco: Never Used  Substance Use Topics  . Alcohol use: Yes    Comment: occassional  . Drug use: No     Allergies   Citrus and Sulfa antibiotics   Review of Systems Review of Systems  Constitutional: Positive for fever.  HENT: Positive for congestion and sore throat. Negative for ear pain, trouble swallowing and voice change.   Respiratory: Positive for cough. Negative for shortness of breath.   Musculoskeletal: Positive for myalgias (Generalized).     Physical Exam Updated Vital Signs BP 99/77 (BP Location: Right Arm)  Pulse 66   Temp 99 F (37.2 C) (Oral)   Resp 14   Ht 5\' 9"  (1.753 m)   Wt 63.5 kg   LMP 11/24/2018 (Approximate)   SpO2 100%   BMI 20.67 kg/m   Physical Exam Vitals signs and nursing note reviewed.  Constitutional:      Appearance: She is well-developed. She is not toxic-appearing.  HENT:     Head: Normocephalic and atraumatic.     Right Ear: Tympanic membrane and ear canal normal.     Left Ear: Tympanic membrane and ear canal normal.     Nose: Congestion present.     Mouth/Throat:     Mouth: Mucous membranes are moist.     Pharynx:  Uvula midline. Posterior oropharyngeal erythema present. No oropharyngeal exudate.     Tonsils: No tonsillar exudate.     Comments: Tolerating secretions, no trismus. Eyes:     Conjunctiva/sclera: Conjunctivae normal.  Neck:     Musculoskeletal: Normal range of motion and neck supple.  Cardiovascular:     Rate and Rhythm: Normal rate and regular rhythm.  Pulmonary:     Effort: Pulmonary effort is normal.     Breath sounds: Normal breath sounds.  Lymphadenopathy:     Cervical: No cervical adenopathy.  Neurological:     Mental Status: She is alert.  Psychiatric:        Mood and Affect: Mood normal.        Behavior: Behavior normal.      ED Treatments / Results  Labs (all labs ordered are listed, but only abnormal results are displayed) Labs Reviewed - No data to display  EKG None  Radiology No results found.  Procedures Procedures (including critical care time)  Medications Ordered in ED Medications - No data to display   Initial Impression / Assessment and Plan / ED Course  I have reviewed the triage vital signs and the nursing notes.  Pertinent labs & imaging results that were available during my care of the patient were reviewed by me and considered in my medical decision making (see chart for details).     Patients symptoms are consistent with URI, likely viral etiology. Afebrile, tolerating secretions.  Lungs clear to auscultation bilaterally. Cough is dry and nonproductive, do not believe CXR is indicated.  Discussed that antibiotics are not indicated for viral infections. Pt will be discharged with symptomatic treatment.  Verbalizes understanding and is agreeable with plan. Pt is hemodynamically stable & in NAD prior to dc.  Discussed results, findings, treatment and follow up. Patient advised of return precautions. Patient verbalized understanding and agreed with plan.  Final Clinical Impressions(s) / ED Diagnoses   Final diagnoses:  Flu-like symptoms     ED Discharge Orders         Ordered    benzonatate (TESSALON) 100 MG capsule  3 times daily PRN     12/08/18 0926           , [place] N, PA-C 12/08/18 1049    Margette Fast, MD 12/08/18 2029

## 2018-12-08 NOTE — Discharge Instructions (Signed)
Please read instructions below.  You can take tylenol or ibuprofen as needed for sore throat or fever.  Drink plenty of water.  Use saline nasal spray for congestion. You can take Tessalon every 8 hours as needed for cough. Follow up with your primary care provider as needed.  Return to the ER for inability to swallow liquids, difficulty breathing, or new or worsening symptoms.

## 2018-12-11 ENCOUNTER — Ambulatory Visit (INDEPENDENT_AMBULATORY_CARE_PROVIDER_SITE_OTHER): Payer: 59 | Admitting: Family Medicine

## 2018-12-11 DIAGNOSIS — Z3042 Encounter for surveillance of injectable contraceptive: Secondary | ICD-10-CM | POA: Diagnosis not present

## 2018-12-11 MED ORDER — MEDROXYPROGESTERONE ACETATE 150 MG/ML IM SUSP
150.0000 mg | Freq: Once | INTRAMUSCULAR | Status: AC
  Administered 2018-12-11: 150 mg via INTRAMUSCULAR

## 2018-12-11 NOTE — Progress Notes (Signed)
Per orders of Dr. Allie Bossier, injection of Depo Provera given by Christena Deem. Patient tolerated injection well.

## 2019-01-22 ENCOUNTER — Encounter (HOSPITAL_COMMUNITY): Payer: Self-pay | Admitting: Emergency Medicine

## 2019-01-22 ENCOUNTER — Emergency Department (HOSPITAL_COMMUNITY)
Admission: EM | Admit: 2019-01-22 | Discharge: 2019-01-22 | Disposition: A | Discharge status: Home / Self Care | Payer: 59 | Attending: Emergency Medicine | Admitting: Emergency Medicine

## 2019-01-22 DIAGNOSIS — E041 Nontoxic single thyroid nodule: Secondary | ICD-10-CM

## 2019-01-22 NOTE — ED Provider Notes (Signed)
[facility] EMERGENCY DEPARTMENT Provider Note   CSN: [id number] Arrival date & time: 01/22/19  1543     History   Chief Complaint Chief Complaint  Patient presents with  . Thyroid Problem    HPI Victoria Carson is a 23 y.o. female.  23 year old female with prior medical history as detailed below presents for evaluation of swelling to the anterior neck.  Patient reports swelling of her right anterior neck.  This is been ongoing for the last 2 to 3 weeks.  She reports that she feels mild pain with swallowing.  She denies difficulty breathing.  She denies fever.  She denies trauma.  Patient is otherwise without complaint.  She denies a prior history of thyroid disease.  The history is provided by the patient and medical records.  Thyroid Problem  This is a new problem. The current episode started more than 1 week ago. The problem occurs constantly. The problem has not changed since onset.Pertinent negatives include no chest pain, no abdominal pain, no headaches and no shortness of breath. Nothing aggravates the symptoms. Nothing relieves the symptoms. She has tried nothing for the symptoms.    Past Medical History:  Diagnosis Date  . Chronic cholecystitis 07/2016  . Dental crown present   . Difficulty swallowing pills   . Dust allergy   . GERD (gastroesophageal reflux disease)    no current med.  . IBS (irritable bowel syndrome)   . Migraines     Patient Active Problem List   Diagnosis Date Noted  . Fatigue 07/06/2018  . Acne vulgaris 10/25/2017  . Interstitial cystitis 10/17/2016  . Encounter for birth control 10/17/2016  . MDD (major depressive disorder), recurrent episode, severe ([place]) 02/15/2013  . GAD (generalized anxiety disorder) 02/15/2013    Past Surgical History:  Procedure Laterality Date  . CHOLECYSTECTOMY N/A 08/08/2016   Procedure: LAPAROSCOPIC CHOLECYSTECTOMY;  Surgeon: Coralie Keens, MD;  Location: [facility];   Service: General;  Laterality: N/A;  . WISDOM TOOTH EXTRACTION       OB History   No obstetric history on file.      Home Medications    Prior to Admission medications   Medication Sig Start Date End Date Taking? Authorizing Provider  benzonatate (TESSALON) 100 MG capsule Take 1 capsule (100 mg total) by mouth 3 (three) times daily as needed for cough. 12/08/18   Robinson, [place] N, PA-C  linaclotide [facility]) 72 MCG capsule Take 1 capsule (72 mcg total) by mouth daily before breakfast. Patient not taking: Reported on 12/08/2018 11/01/18   Jearld Fenton, NP  medroxyPROGESTERone (DEPO-PROVERA) 150 MG/ML injection Inject 150 mg into the muscle every 3 (three) months.    [provider]    Family History Family History  Problem Relation Age of Onset  . Mental illness Mother   . Drug abuse Mother   . Heart failure Other   . Hypertension Other   . Depression Paternal Aunt   . Anxiety disorder Paternal Aunt     Social History Social History   Tobacco Use  . Smoking status: Never Smoker  . Smokeless tobacco: Never Used  Substance Use Topics  . Alcohol use: Yes    Comment: occassional  . Drug use: No     Allergies   Citrus and Sulfa antibiotics   Review of Systems Review of Systems  Respiratory: Negative for shortness of breath.   Cardiovascular: Negative for chest pain.  Gastrointestinal: Negative for abdominal pain.  Neurological: Negative  for headaches.  All other systems reviewed and are negative.    Physical Exam Updated Vital Signs BP 128/68 (BP Location: Right Arm)   Pulse 85   Temp 98.9 F (37.2 C) (Oral)   Resp 16   SpO2 99%   Physical Exam Vitals signs and nursing note reviewed.  Constitutional:      General: She is not in acute distress.    Appearance: She is well-developed.  HENT:     Head: Normocephalic and atraumatic.  Eyes:     Conjunctiva/sclera: Conjunctivae normal.     Pupils: Pupils are equal, round, and reactive to  light.  Neck:     Musculoskeletal: Normal range of motion and neck supple.     Comments: Mild fullness over the right anterior thyroid.  This is consistent with possible thyroid nodule.  No overlying erythema or significant tenderness. Cardiovascular:     Rate and Rhythm: Normal rate and regular rhythm.     Heart sounds: Normal heart sounds.  Pulmonary:     Effort: Pulmonary effort is normal. No respiratory distress.     Breath sounds: Normal breath sounds.  Abdominal:     General: There is no distension.     Palpations: Abdomen is soft.     Tenderness: There is no abdominal tenderness.  Musculoskeletal: Normal range of motion.        General: No deformity.  Skin:    General: Skin is warm and dry.  Neurological:     Mental Status: She is alert and oriented to person, place, and time.      ED Treatments / Results  Labs (all labs ordered are listed, but only abnormal results are displayed) Labs Reviewed - No data to display  EKG None  Radiology No results found.  Procedures Procedures (including critical care time)  Medications Ordered in ED Medications - No data to display   Initial Impression / Assessment and Plan / ED Course  I have reviewed the triage vital signs and the nursing notes.  Pertinent labs & imaging results that were available during my care of the patient were reviewed by me and considered in my medical decision making (see chart for details).     MDM  Screen complete  Patient is presenting for evaluation of likely thyroid mass.  Patient's symptoms have been ongoing for the last several weeks.  Exam suggest the possibility of a thyroid nodule.  Patient does understand the need for close follow-up both with her regular doctor and with ENT.  Strict return precautions given and understood.  Importance of close follow-up is stressed.  Final Clinical Impressions(s) / ED Diagnoses   Final diagnoses:  Thyroid nodule    ED Discharge Orders     None       Valarie Merino, MD 01/22/19 [phone]

## 2019-01-22 NOTE — ED Notes (Signed)
ED Provider at bedside. 

## 2019-01-22 NOTE — Discharge Instructions (Addendum)
Please return for any problem.  Follow-up with your regular care provider as instructed for further evaluation of your likely thyroid nodule.

## 2019-01-24 ENCOUNTER — Ambulatory Visit: Payer: Self-pay | Admitting: Primary Care

## 2019-01-24 VITALS — BP 106/66 | HR 77 | Temp 98.3°F | Ht 69.0 in | Wt 143.8 lb

## 2019-01-24 DIAGNOSIS — R221 Localized swelling, mass and lump, neck: Secondary | ICD-10-CM

## 2019-01-24 DIAGNOSIS — E038 Other specified hypothyroidism: Secondary | ICD-10-CM | POA: Insufficient documentation

## 2019-01-24 DIAGNOSIS — E039 Hypothyroidism, unspecified: Secondary | ICD-10-CM

## 2019-01-24 LAB — CBC
HEMATOCRIT: 40.5 % (ref 36.0–46.0)
Hemoglobin: 13.8 g/dL (ref 12.0–15.0)
MCHC: 34 g/dL (ref 30.0–36.0)
MCV: 91.1 fl (ref 78.0–100.0)
PLATELETS: 296 10*3/uL (ref 150.0–400.0)
RBC: 4.45 Mil/uL (ref 3.87–5.11)
RDW: 13.3 % (ref 11.5–15.5)
WBC: 5.3 10*3/uL (ref 4.0–10.5)

## 2019-01-24 LAB — T4, FREE: Free T4: 0.79 ng/dL (ref 0.60–1.60)

## 2019-01-24 LAB — TSH: TSH: 7.42 u[IU]/mL — AB (ref 0.35–4.50)

## 2019-01-24 NOTE — Patient Instructions (Signed)
Stop by the lab prior to leaving today. I will notify you of your results once received.   Stop by the front desk and speak with either Rosaria Ferries or [place] regarding your Ultrasound.   It was a pleasure to see you today!

## 2019-01-24 NOTE — Progress Notes (Signed)
Subjective:    Patient ID: Victoria Carson, female    DOB: 1996-09-13, 23 y.o.   MRN: [id number]  HPI  Victoria Carson is a 23 year old female who presents today for emergency department follow up.  She presented to [facility] on 01/22/19 with a chief complaint of anterior neck swelling to the right side x 2-3 weeks. She denied respiratory symptoms. Her exam was consistent for thyroid nodule, she did not undergo ultrasound or labs. She was discharged home with recommendations for PCP/ENT follow up.  Since her discharge home she continues to notice the mass to her anterior neck, she describes this as a "rock".  She denies increased shortness of breath, fevers, chills. She did have a cold . She does feeling really hot, also some dizziness, diarrhea, palpitations. She initially noticed the "rock sensation" to her neck for the last three weeks.   Wt Readings from Last 3 Encounters:  01/24/19 143 lb 12 oz (65.2 kg)  12/08/18 140 lb (63.5 kg)  11/01/18 142 lb (64.4 kg)     Review of Systems  Constitutional: Negative for appetite change, fever and unexpected weight change.  Respiratory: Negative for shortness of breath.   Cardiovascular: Positive for palpitations. Negative for chest pain.  Gastrointestinal: Positive for diarrhea.  Endocrine: Positive for heat intolerance.  Neurological: Positive for dizziness.       Past Medical History:  Diagnosis Date  . Chronic cholecystitis 07/2016  . Dental crown present   . Difficulty swallowing pills   . Dust allergy   . GERD (gastroesophageal reflux disease)    no current med.  . IBS (irritable bowel syndrome)   . Migraines      Social History   Socioeconomic History  . Marital status: Single    Spouse name: n/a  . Number of children: 0  . Years of education: Not on file  . Highest education level: Not on file  Occupational History  . 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 Financial resource strain:  Not on file  . Food insecurity:    Worry: Not on file    Inability: Not on file  . Transportation needs:    Medical: Not on file    Non-medical: Not on file  Tobacco Use  . Smoking status: Never Smoker  . Smokeless tobacco: Never Used  Substance and Sexual Activity  . Alcohol use: Yes    Comment: occassional  . Drug use: No  . Sexual activity: Never    Partners: Male    Birth control/protection: Inserts  Lifestyle  . Physical activity:    Days per week: Not on file    Minutes per session: Not on file  . Stress: Not on file  Relationships  . Social connections:    Talks on phone: Not on file    Gets together: Not on file    Attends religious service: Not on file    Active member of club or organization: Not on file    Attends meetings of clubs or organizations: Not on file    Relationship status: Not on file  . Intimate partner violence:    Fear of current or ex partner: Not on file    Emotionally abused: Not on file    Physically abused: Not on file    Forced sexual activity: Not on file  Other Topics Concern  . Not on file  Social History Narrative   05/01/2013 Victoria Carson was  poor in [place], [place].. She currently lives in [place] with her father, grandfather, older sister, and younger brother. She currently is homeschooled and in the 10th grade. She has a boyfriend of 2 months. 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 2 years ago, she was caught shoplifting. She reports that her social support system consists of a friend. 05/01/2013 Victoria      Single. Lives with father.    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   Enjoys spending time with her friends.                Past Surgical History:  Procedure Laterality Date  . CHOLECYSTECTOMY N/A 08/08/2016   Procedure: LAPAROSCOPIC CHOLECYSTECTOMY;  Surgeon: Coralie Keens, MD;  Location: [facility];  Service: General;  Laterality: N/A;  . WISDOM TOOTH  EXTRACTION      Family History  Problem Relation Age of Onset  . Mental illness Mother   . Drug abuse Mother   . Heart failure Other   . Hypertension Other   . Depression Paternal Aunt   . Anxiety disorder Paternal Aunt     Allergies  Allergen Reactions  . Citrus Itching and Other (See Comments)    SWEATING  . Sulfa Antibiotics Nausea Only, Other (See Comments) and Nausea And Vomiting    FEVER    Current Outpatient Medications on File Prior to Visit  Medication Sig Dispense Refill  . medroxyPROGESTERone (DEPO-PROVERA) 150 MG/ML injection Inject 150 mg into the muscle every 3 (three) months.     No current facility-administered medications on file prior to visit.     BP 106/66   Pulse 77   Temp 98.3 F (36.8 C) (Oral)   Ht 5\' 9"  (1.753 m)   Wt 143 lb 12 oz (65.2 kg)   SpO2 98%   BMI 21.23 kg/m    Objective:   Physical Exam  Constitutional: She is oriented to person, place, and time. She appears well-nourished.  Neck: Thyromegaly present.  Potential thyroid nodule to right anterior thyroid. Generalized swelling to anterior neck at thyroid gland level.  Cardiovascular: Normal rate and regular rhythm.  Respiratory: Effort normal and breath sounds normal.  GI: Soft. Bowel sounds are normal.  Neurological: She is alert and oriented to person, place, and time.  Skin: Skin is warm and dry.  No diaphoresis   Psychiatric: She has a normal mood and affect.           Assessment & Plan:

## 2019-01-24 NOTE — Addendum Note (Signed)
Addended by: Ellamae Sia on: 01/24/2019 04:51 PM   Modules accepted: Orders

## 2019-01-24 NOTE — Assessment & Plan Note (Signed)
Evaluated in the ED, presumed to have thyroid nodule. No imaging or labs done. Notes reviewed. Exam today consistent for thyroid nodule vs thyroid mass. She does have symptoms of Hyperthyroidism/Graves Disease. She does appear stable and is in no distress. Check labs today including TSI, TSH, Free T4, CBC. Stat thyroid ultrasound ordered.  Await results. Consider Endo referral.

## 2019-01-25 ENCOUNTER — Ambulatory Visit
Admission: RE | Admit: 2019-01-25 | Discharge: 2019-01-25 | Disposition: A | Discharge status: Home / Self Care | Payer: No Typology Code available for payment source | Source: Ambulatory Visit | Attending: Primary Care | Admitting: Primary Care

## 2019-01-25 ENCOUNTER — Other Ambulatory Visit: Payer: Self-pay | Admitting: Primary Care

## 2019-01-25 DIAGNOSIS — E063 Autoimmune thyroiditis: Principal | ICD-10-CM

## 2019-01-25 DIAGNOSIS — R221 Localized swelling, mass and lump, neck: Secondary | ICD-10-CM

## 2019-01-25 DIAGNOSIS — E038 Other specified hypothyroidism: Secondary | ICD-10-CM

## 2019-01-25 LAB — THYROID PEROXIDASE ANTIBODIES (TPO) (REFL): Thyroperoxidase Ab SerPl-aCnc: >900 IU/mL — ABNORMAL HIGH (ref <9)

## 2019-01-25 MED ORDER — LEVOTHYROXINE SODIUM 25 MCG PO TABS: ORAL_TABLET | ORAL | 1 refills | Status: DC

## 2019-01-25 NOTE — Assessment & Plan Note (Signed)
Recent testing indicative of Hashimoto's thyroiditis. We will treat with levothyroxine 25 mcg. Discussed instructions for use and to follow-up in 6 weeks for reevaluation.

## 2019-01-28 LAB — THYROID STIMULATING IMMUNOGLOBULIN: TSI: <89 % baseline (ref <140)

## 2019-03-05 ENCOUNTER — Telehealth: Payer: Self-pay | Admitting: Primary Care

## 2019-03-05 NOTE — Telephone Encounter (Signed)
Per DPR, left detail message to call back to ask if okay to change to a phone call visit

## 2019-03-12 ENCOUNTER — Encounter: Payer: Self-pay | Admitting: Primary Care

## 2019-03-12 ENCOUNTER — Ambulatory Visit: Payer: Self-pay | Admitting: Primary Care

## 2019-03-12 ENCOUNTER — Other Ambulatory Visit: Payer: Self-pay

## 2019-03-12 VITALS — BP 106/72 | HR 86 | Temp 98.6°F | Ht 69.0 in | Wt 144.5 lb

## 2019-03-12 DIAGNOSIS — R002 Palpitations: Secondary | ICD-10-CM | POA: Insufficient documentation

## 2019-03-12 DIAGNOSIS — Z3042 Encounter for surveillance of injectable contraceptive: Secondary | ICD-10-CM

## 2019-03-12 DIAGNOSIS — E039 Hypothyroidism, unspecified: Secondary | ICD-10-CM

## 2019-03-12 LAB — TSH: TSH: 3.64 u[IU]/mL (ref 0.35–4.50)

## 2019-03-12 MED ORDER — MEDROXYPROGESTERONE ACETATE 150 MG/ML IM SUSP
150.0000 mg | Freq: Once | INTRAMUSCULAR | Status: AC
  Administered 2019-03-12: 150 mg via INTRAMUSCULAR

## 2019-03-12 NOTE — Assessment & Plan Note (Addendum)
Intermittent and overall improved since prior to her visit in mid February 2020. Fluctuations of rate noted during auscultation. 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  ECG today with rate variation that seems to start and stop, this was noted on auscultation. Normal P-waves, no t-wave inversion, no PVC's. Will consult with cardiology.

## 2019-03-12 NOTE — Assessment & Plan Note (Addendum)
Taking levothyroxine appropriately and is compliant. Repeat TSH pending today. Will alter dose if needed based off of labs.

## 2019-03-12 NOTE — Progress Notes (Signed)
Subjective:    Patient ID: Victoria Carson, Carson    DOB: [birth year], 23 y.o.   MRN: [id number]  HPI  Victoria Carson who presents today for follow up of hypothyroidism. She is also due for her Depo Provera injection today.   She was last evaluated on 01/24/19 for symptoms of anterior neck swelling, palpitations, dizziness, diarrhea. She underwent testing which was positive for Hashimoto's Thyroiditis so she was treated with levothyroxine 25 mcg.  Since her last visit she's compliant to levothyroxine 25 mcg daily. She takes her levothyroxine every morning with water only. She waits 30 minutes before eating or drinking other liquids. She has noticed some intermittent palpitations that will occur during rest and activity, this lasts for several seconds then will dissipate. She does have a family history of Victoria Carson disorder in her sister. She does continue to feel fatigued.    Review of Systems  Constitutional: Positive for fatigue.  Eyes: Negative for visual disturbance.  Respiratory: Negative for shortness of breath.   Cardiovascular: Positive for palpitations. Negative for chest pain.  Neurological: Negative for dizziness and headaches.       Past Medical History:  Diagnosis Date  . Chronic cholecystitis 07/2016  . Dental crown present   . Difficulty swallowing pills   . Dust allergy   . GERD (gastroesophageal reflux disease)    no current med.  . IBS (irritable bowel syndrome)   . Migraines      Social History   Socioeconomic History  . Marital status: Single    Spouse name: n/a  . Number of children: 0  . Years of education: Not on file  . Highest education level: Not on file  Occupational History  . 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 Financial resource strain: Not on file  . Food insecurity:    Worry: Not on file    Inability: Not on file  . Transportation needs:    Medical: Not on  file    Non-medical: Not on file  Tobacco Use  . Smoking status: Never Smoker  . Smokeless tobacco: Never Used  Substance and Sexual Activity  . Alcohol use: Yes    Comment: occassional  . Drug use: No  . Sexual activity: Never    Partners: Male    Birth control/protection: Inserts  Lifestyle  . Physical activity:    Days per week: Not on file    Minutes per session: Not on file  . Stress: Not on file  Relationships  . Social connections:    Talks on phone: Not on file    Gets together: Not on file    Attends religious service: Not on file    Active member of club or organization: Not on file    Attends meetings of clubs or organizations: Not on file    Relationship status: Not on file  . Intimate partner violence:    Fear of current or ex partner: Not on file    Emotionally abused: Not on file    Physically abused: Not on file    Forced sexual activity: Not on file  Other Topics Concern  . Not on file  Social History Narrative   05/01/2013 AHW Dvora was poor in [place], [place].. She currently lives in [place] with her father, grandfather, older sister, and younger brother. She currently is homeschooled and in the 10th grade. She has a boyfriend of  2 months. 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 2 years ago, she was caught shoplifting. She reports that her social support system consists of a friend. 05/01/2013 A[facility]      Single. Lives with father.    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   Enjoys spending time with her friends.                Past Surgical History:  Procedure Laterality Date  . CHOLECYSTECTOMY N/A 08/08/2016   Procedure: LAPAROSCOPIC CHOLECYSTECTOMY;  Surgeon: Coralie Keens, MD;  Location: [facility];  Service: General;  Laterality: N/A;  . WISDOM TOOTH EXTRACTION      Family History  Problem Relation Age of Onset  . Mental illness Mother   . Drug abuse Mother   . Heart failure  Other   . Hypertension Other   . Depression Paternal Aunt   . Anxiety disorder Paternal Aunt     Allergies  Allergen Reactions  . Citrus Itching and Other (See Comments)    SWEATING  . Sulfa Antibiotics Nausea Only, Other (See Comments) and Nausea And Vomiting    FEVER    Current Outpatient Medications on File Prior to Visit  Medication Sig Dispense Refill  . levothyroxine (SYNTHROID, LEVOTHROID) 25 MCG tablet Take 1 tablet by mouth every morning on empty stomach with water only.  No food or other medicines for 30 minutes. 30 tablet 1  . medroxyPROGESTERone (DEPO-PROVERA) 150 MG/ML injection Inject 150 mg into the muscle every 3 (three) months.     No current facility-administered medications on file prior to visit.     BP 106/72   Pulse 86   Temp 98.6 F (37 C) (Oral)   Ht 5\' 9"  (1.753 m)   Wt 144 lb 8 oz (65.5 kg)   SpO2 99%   BMI 21.34 kg/m    Objective:   Physical Exam  Constitutional: She appears well-nourished.  Neck: Neck supple.  Mild anterior neck enlargement, no nodules palpated  Cardiovascular: Normal rate and regular rhythm.  Noted periods of alternating faster and slower rate. Not SVT.  Respiratory: Effort normal and breath sounds normal.  Skin: Skin is warm and dry.  Psychiatric: She has a normal mood and affect.           Assessment & Plan:

## 2019-03-12 NOTE — Addendum Note (Signed)
Addended by: Jacqualin Combes on: 03/12/2019 12:14 PM   Modules accepted: Orders

## 2019-03-12 NOTE — Assessment & Plan Note (Signed)
Due for next Depo Provera injection today. Administered.

## 2019-03-12 NOTE — Patient Instructions (Signed)
Stop by the lab prior to leaving today. I will notify you of your results once received.   Continue to take your levothyroxine every morning with water only. No food or other medications for 30 minutes. No vitamins for 4 hours.  I'll be in touch today or tomorrow regarding your results.  Nice to see you!

## 2019-03-13 DIAGNOSIS — R002 Palpitations: Secondary | ICD-10-CM

## 2019-03-20 ENCOUNTER — Other Ambulatory Visit: Payer: Self-pay | Admitting: Primary Care

## 2019-03-20 DIAGNOSIS — E063 Autoimmune thyroiditis: Principal | ICD-10-CM

## 2019-03-20 DIAGNOSIS — E038 Other specified hypothyroidism: Secondary | ICD-10-CM

## 2019-03-25 NOTE — Telephone Encounter (Signed)
Victoria Carson, I placed a referral for cardiology on 04/02. She is willing to go to [place] if we can get her in sooner.

## 2019-03-26 ENCOUNTER — Telehealth: Payer: Self-pay | Admitting: Cardiology

## 2019-03-26 NOTE — Telephone Encounter (Signed)
Virtual Visit Pre-Appointment Phone Call    TELEPHONE CALL NOTE  HELAYNE METSKER has been deemed a candidate for a follow-up tele-health visit to limit community exposure during the Covid-19 pandemic. I spoke with the patient via phone to ensure availability of phone/video source, confirm preferred email & phone number, and discuss instructions and expectations.  I reminded Deaisha L Surratt to be prepared with any vital sign and/or heart rhythm information that could potentially be obtained via home monitoring, at the time of her visit. I reminded Mikeila L Surratt to expect a phone call at the time of her visit if her visit.  Janine Limbo 03/26/2019 1:55 PM   DOWNLOADING THE WEBEX APP TO SMARTPHONE  - If Apple, ask patient to go to App Store and type in WebEx in the search bar. [place] [employer], the blue/green circle. If Android, go to [employer] and type in [employer] in the search bar. The app is free but as with any other app downloads, their phone may require them to verify saved payment information or Apple/Android password.  - The patient does NOT have to create an account. - On the day of the visit, the assist will walk the patient through joining the meeting with the meeting number/password.  DOWNLOADING THE MYCHART APP TO SMARTPHONE  - If Apple, go to [employer] and type in MyChart in the search bar and download the app. If Android, ask patient to go to [employer] and type in [place] in the search bar and download the app. The app is free but as with any other app downloads, their phone may require them to verify saved payment information or Apple/Android password.  - The patient will need to then log into the app with their MyChart username and password, and select [facility] as their healthcare provider to link the account. When it is time for your visit, go to the MyChart app, find appointments, and click Begin Video Visit. Be sure to Select Allow  for your device to access the Microphone and Camera for your visit. You will then be connected, and your provider will be with you shortly.  **If they have any issues connecting, or need assistance please contact [facility] [phone])**  **If using a computer, in order to ensure the best quality for your visit they will need to use either of the following Internet Browsers: Microsoft [place], or Google Chrome**  [place]   I hereby voluntarily request, consent and authorize [facility] and its employed or contracted physicians, physician assistants, nurse practitioners or other licensed health care professionals (the Practitioner), to provide me with telemedicine health care services (the Services") as deemed necessary by the treating Practitioner. I acknowledge and consent to receive the Services by the Practitioner via telemedicine. I understand that the telemedicine visit will involve communicating with the Practitioner through live audiovisual communication technology and the disclosure of certain medical information by electronic transmission. I acknowledge that I have been given the opportunity to request an in-person assessment or other available alternative prior to the telemedicine visit and am voluntarily participating in the telemedicine visit.  I understand that I have the right to withhold or withdraw my consent to the use of telemedicine in the course of my care at any time, without affecting my right to future care or treatment, and that the Practitioner or I may terminate the telemedicine visit at any time. I understand that I  have the right to inspect all information obtained and/or recorded in the course of the telemedicine visit and may receive copies of available information for a reasonable fee.  I understand that some of the potential risks of receiving the Services via telemedicine include:   Delay or interruption in  medical evaluation due to technological equipment failure or disruption;  Information transmitted may not be sufficient (e.g. poor resolution of images) to allow for appropriate medical decision making by the Practitioner; and/or   In rare instances, security protocols could fail, causing a breach of personal health information.  Furthermore, I acknowledge that it is my responsibility to provide information about my medical history, conditions and care that is complete and accurate to the best of my ability. I acknowledge that Practitioner's advice, recommendations, and/or decision may be based on factors not within their control, such as incomplete or inaccurate data provided by me or distortions of diagnostic images or specimens that may result from electronic transmissions. I understand that the practice of medicine is not an exact science and that Practitioner makes no warranties or guarantees regarding treatment outcomes. I acknowledge that I will receive a copy of this consent concurrently upon execution via email to the email address I last provided but may also request a printed copy by calling the office of [facility].    I understand that my insurance will be billed for this visit.   I have read or had this consent read to me.  I understand the contents of this consent, which adequately explains the benefits and risks of the Services being provided via telemedicine.   I have been provided ample opportunity to ask questions regarding this consent and the Services and have had my questions answered to my satisfaction.  I give my informed consent for the services to be provided through the use of telemedicine in my medical care  By participating in this telemedicine visit I agree to the above.  YES     Cardiac Questionnaire:    Since your last visit or hospitalization:    1. Have you been having new or worsening chest pain? YES   2. Have you been having new or worsening  shortness of breath? YES 3. Have you been having new or worsening leg swelling, wt gain, or increase in abdominal girth (pants fitting more tightly)? YES   4. Have you had any passing out spells? YES    *A YES to any of these questions would result in the appointment being kept. *If all the answers to these questions are NO, we should indicate that given the current situation regarding the worldwide coronarvirus pandemic, at the recommendation of the CDC, we are looking to limit gatherings in our waiting area, and thus will reschedule their appointment beyond four weeks from today.   _____________   COVID-19 Pre-Screening Questions:   Do you currently have a fever? NO  Have you recently travelled on a cruise, internationally, or to [place], [place], [place], [place], [place], or [place], [place] [employer]) ? NO  Have you been in contact with someone that is currently pending confirmation of Covid19 testing or has been confirmed to have the [place] virus?  NO  Are you currently experiencing fatigue or cough? NO

## 2019-03-29 ENCOUNTER — Telehealth (INDEPENDENT_AMBULATORY_CARE_PROVIDER_SITE_OTHER): Payer: Self-pay | Admitting: Cardiology

## 2019-03-29 ENCOUNTER — Encounter: Payer: Self-pay | Admitting: Cardiology

## 2019-03-29 VITALS — Ht 69.0 in | Wt 140.0 lb

## 2019-03-29 DIAGNOSIS — R002 Palpitations: Secondary | ICD-10-CM

## 2019-03-29 DIAGNOSIS — E039 Hypothyroidism, unspecified: Secondary | ICD-10-CM

## 2019-03-29 MED ORDER — METOPROLOL TARTRATE 25 MG PO TABS: 12.5000 mg | ORAL_TABLET | Freq: Four times a day (QID) | ORAL | 1 refills | Status: DC | PRN

## 2019-03-29 NOTE — Patient Instructions (Addendum)
Medication Instructions:  Your physician has recommended you make the following change in your medication:  START: Metoprolol 25mg  (0.5 tab -1 tab) every 6 hrs as needed for palpitations  If you need a refill on your cardiac medications before your next appointment, please call your pharmacy.   Lab work: None  If you have labs (blood work) drawn today and your tests are completely normal, you will receive your results only by: Marland Kitchen MyChart Message (if you have MyChart) OR . A paper copy in the mail If you have any lab test that is abnormal or we need to change your treatment, we will call you to review the results.  Testing/Procedures: None  Follow-Up: At [facility], you and your health needs are our priority.  As part of our continuing mission to provide you with exceptional heart care, we have created designated Provider Care Teams.  These Care Teams include your primary Cardiologist (physician) and Advanced Practice Providers (APPs -  Physician Assistants and Nurse Practitioners) who all work together to provide you with the care you need, when you need it. You will need a follow up appointment in 5 weeks.    Any Other Special Instructions Will Be Listed Below (If Applicable).  1. Avoid all over-the-counter antihistamines except Claritin/Loratadine and Zyrtec/Cetrizine. 2. Avoid all combination including cold sinus allergies flu decongestant and sleep medications 3. You can use Robitussin DM , Mucinex and Mucinex DM for cough. 4. You can use Tylenol aspirin ibuprofen and naproxen but no combinations such as sleep or sinus.

## 2019-03-29 NOTE — Progress Notes (Signed)
Virtual Visit via Video Note   This visit type was conducted due to national recommendations for restrictions regarding the COVID-19 Pandemic (e.g. social distancing) in an effort to limit this patient's exposure and mitigate transmission in our community.  Due to her co-morbid illnesses, this patient is at least at moderate risk for complications without adequate follow up.  This format is felt to be most appropriate for this patient at this time.  All issues noted in this document were discussed and addressed.  A limited physical exam was performed with this format.  Please refer to the patient's chart for her consent to telehealth for [facility].   Evaluation Performed:  Follow-up visit  Date:  03/29/2019   ID:  Victoria Carson, DOB [birth year], MRN [id number]  Patient Location: Home Provider Location: Office  PCP:  Pleas Koch, NP  Cardiologist:  No primary care provider on file. Dr Bettina Gavia Electrophysiologist:  None   Chief Complaint:  Palpitation  History of Present Illness:    Victoria Carson is a 23 y.o. female with Hashimoto's thyroid disease goiter hypothyroidism and started thyroid supplement 2 months ago.  Her problem now is palpitation and she has had episodes for the last few months that are increasingly bothersome her heart feels like it races and last 10 to 15 minutes the heart rates are variable and she is recorded them in an apple watch from [phone] and when it breaks it will drop to the 60s.  She has a tightness in her chest associated with it.  Her sister has preexcitation has had EP catheter ablation.  She has no documented arrhythmia and takes no over-the-counter proarrhythmic drugs.  Between episodes she has no cardiovascular symptoms of chest pain shortness of breath  or syncope and she has no history of congenital or rheumatic heart disease.  The patient does not have symptoms concerning for COVID-19 infection (fever, chills, cough, or new shortness  of breath).    Past Medical History:  Diagnosis Date   Chronic cholecystitis 07/2016   Dental crown present    Difficulty swallowing pills    Dust allergy    GERD (gastroesophageal reflux disease)    no current med.   Hypothyroidism (acquired)    IBS (irritable bowel syndrome)    Migraines    Past Surgical History:  Procedure Laterality Date   CHOLECYSTECTOMY N/A 08/08/2016   Procedure: LAPAROSCOPIC CHOLECYSTECTOMY;  Surgeon: Coralie Keens, MD;  Location: [facility];  Service: General;  Laterality: N/A;   WISDOM TOOTH EXTRACTION       Current Meds  Medication Sig   levothyroxine (SYNTHROID, LEVOTHROID) 25 MCG tablet TAKE 1 TAB BY MOUTH EVERY MORNING ON EMPTY STOMACH WITH WATER. NO FOOD OR OTHER MEDS FOR 30 MIN   medroxyPROGESTERone (DEPO-PROVERA) 150 MG/ML injection Inject 150 mg into the muscle every 3 (three) months.     Allergies:   Citrus and Sulfa antibiotics   Social History   Tobacco Use   Smoking status: Never Smoker   Smokeless tobacco: Never Used  Substance Use Topics   Alcohol use: Yes    Comment: occassional   Drug use: No     Family Hx: The patient's family history includes Anxiety disorder in her paternal aunt; Depression in her paternal aunt; Drug abuse in her mother; Heart failure in an other family member; Hypertension in an other family member; Mental illness in her mother; Stroke in her paternal grandfather.  ROS: Pulmonary no shortness of breath cough wheezing  Constitutional no fevers chills malaise or fatigue            Neurologic no focal symptoms  Please see the history of present illness.       Prior CV studies:   The following studies were reviewed today:  Labs/Other Tests and Data Reviewed:    EKG:  An ECG dated 03/12/19 was personally reviewed today and demonstrated:  sinus rhythm with physiologic sinus arrhythmia and was normal for age.  In particular conduction intervals are normal and  she does not have preexcitation  Recent Labs: 10/29/2018: ALT 12; BUN 9; Creatinine, Ser 0.77; Potassium 3.6; Sodium 142 01/24/2019: Hemoglobin 13.8; Platelets 296.0 03/12/2019: TSH 3.64   Recent Lipid Panel No results found for: CHOL, TRIG, HDL, CHOLHDL, LDLCALC, LDLDIRECT  Wt Readings from Last 3 Encounters:  03/29/19 140 lb (63.5 kg)  03/12/19 144 lb 8 oz (65.5 kg)  01/24/19 143 lb 12 oz (65.2 kg)     Objective:    Vital Signs:  Ht 5\' 9"  (1.753 m)    Wt 140 lb (63.5 kg)    BMI 20.67 kg/m  Constitutional, well-nourished well-developed in no acute distress Vital signs reviewed Eyes, conjunctiva and sclera are normal without pallor or icterus extraocular motions intact and normal there is no lid lag Respiratory, normal effort and excursion no audible wheezing without a stethoscope Cardiovascular, no neck vein distention or peripheral edema Skin, no rash skin lesion or ulceration of the extremities Neurologic, cranial nerves II to XII are grossly intact and the patient moves all 4 extremities Neuro/Psychiatric, judgment and thought processes are intact and coherent, alert and oriented x3, mood and affect appear normal.  ASSESSMENT & PLAN:    Her symptoms are quite suggestive of atrial arrhythmia worsened by thyroid supplementation.  We discussed evaluation either mailing a monitor at home to wear 1 to 2 weeks or purchasing the iPhone adapter she chooses the latter and will capture several episodes.  I am hesitant to put on long-term suppressive therapy without arrhythmia being documented but I will give her prescription for short acting Lopressor to take as needed.  If we document sustained atrial arrhythmia she would benefit from another suppressant therapy beta-blocker and antiarrhythmic drug like flecainide or referral for EP catheter ablation.  I am concerned that her thyroid supplement is provocative in general and cardiology will start low and go slow and may benefit from a reduced  dose of thyroid supplement as she was not symptomatically hypothyroid.  At this time I do not think she requires cardiac imaging modalities stress test and I will plan to see her in the office in 6 months or sooner if we capture an arrhythmia and she can send the strips through the my chart.  In the interim of asked her avoid over-the-counter proarrhythmic drugs.  She voiced understanding with this approach  COVID-19 Education: The signs and symptoms of COVID-19 were discussed with the patient and how to seek care for testing (follow up with PCP or arrange E-visit).  The importance of social distancing was discussed today. 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     Medication Adjustments/Labs and Tests Ordered: Current medicines are reviewed at length with the patient today.  Concerns regarding medicines are outlined above.   Tests Ordered: No orders of the defined types were placed in this encounter.  Medication Changes: No orders of the defined types were placed in this encounter.   Disposition:  Follow up 6 weeks hopefully in the office   Signed, Shirlee More, MD  03/29/2019 3:24 PM    [place]

## 2019-04-10 NOTE — Telephone Encounter (Signed)
complete

## 2019-04-22 ENCOUNTER — Ambulatory Visit (INDEPENDENT_AMBULATORY_CARE_PROVIDER_SITE_OTHER): Payer: No Typology Code available for payment source | Admitting: Primary Care

## 2019-04-22 ENCOUNTER — Encounter: Payer: Self-pay | Admitting: Primary Care

## 2019-04-22 VITALS — Wt 144.0 lb

## 2019-04-22 DIAGNOSIS — E039 Hypothyroidism, unspecified: Secondary | ICD-10-CM

## 2019-04-22 NOTE — Progress Notes (Signed)
Subjective:    Patient ID: Victoria Carson, female    DOB: 1996/06/24, 23 y.o.   MRN: [id number]  HPI  Virtual Visit via Video Note  I connected with Victoria Carson on 04/22/19 at 10:40 AM EDT by a video enabled telemedicine application and verified that I am speaking with the correct person using two identifiers.  Location: Patient: Home Provider: Office   I discussed the limitations of evaluation and management by telemedicine and the availability of in person appointments. The patient expressed understanding and agreed to proceed.  History of Present Illness:  Victoria Carson is a 23 year old female with a history of newly diagnosed hypothyroidism who presents today with a chief complaint of neck swelling.  Her swelling is located to the anterior neck over the thyroid gland for which her boyfriend noticed 6 days ago. At the time she noticed a "rock" sensation to her throat when swallowing but wasn't bothered by this. Since then her swelling has been the same.   She is currently managed on levothyroxine 25 mcg for which she takes every morning with water only, no food or other medications for 30 minutes. Her last TSH was 3.64 on 03/12/19. She is compliant to her levothyroxine daily as prescribed.   She denies palpitations, shortness of breath, difficulty swallowing, cough, fevers. She has been sneezing some.    Observations/Objective:  Alert and oriented. Appears well, not sickly. No distress. Speaking in complete sentences.  Mild swelling noted to anterior neck over thyroid gland.   Assessment and Plan:  See problem based charting.  Follow Up Instructions:  Call the front office to schedule a lab appointment.   I'll be in touch soon with results! It was a pleasure to see you today! Victoria Bossier, NP-C    I discussed the assessment and treatment plan with the patient. The patient was provided an opportunity to ask questions and all were answered. The patient agreed  with the plan and demonstrated an understanding of the instructions.   The patient was advised to call back or seek an in-person evaluation if the symptoms worsen or if the condition fails to improve as anticipated.     Pleas Koch, NP    Review of Systems  Constitutional: Negative for fever.  HENT:       Anterior neck swelling  Respiratory: Negative for shortness of breath.   Cardiovascular: Negative for chest pain and palpitations.       Past Medical History:  Diagnosis Date  . Chronic cholecystitis 07/2016  . Dental crown present   . Difficulty swallowing pills   . Dust allergy   . GERD (gastroesophageal reflux disease)    no current med.  . Hypothyroidism (acquired)   . IBS (irritable bowel syndrome)   . Migraines      Social History   Socioeconomic History  . Marital status: Single    Spouse name: n/a  . Number of children: 0  . Years of education: Not on file  . Highest education level: Not on file  Occupational History  . 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 Financial resource strain: Not on file  . Food insecurity:    Worry: Not on file    Inability: Not on file  . Transportation needs:    Medical: Not on file    Non-medical: Not on file  Tobacco Use  . Smoking status: Never Smoker  . Smokeless tobacco: Never  Used  Substance and Sexual Activity  . Alcohol use: Yes    Comment: occassional  . Drug use: No  . Sexual activity: Never    Partners: Male    Birth control/protection: Inserts  Lifestyle  . Physical activity:    Days per week: Not on file    Minutes per session: Not on file  . Stress: Not on file  Relationships  . Social connections:    Talks on phone: Not on file    Gets together: Not on file    Attends religious service: Not on file    Active member of club or organization: Not on file    Attends meetings of clubs or organizations: Not on file    Relationship status: Not on file  .  Intimate partner violence:    Fear of current or ex partner: Not on file    Emotionally abused: Not on file    Physically abused: Not on file    Forced sexual activity: Not on file  Other Topics Concern  . Not on file  Social History Narrative   05/01/2013 AHW Talah was poor in [place], [place].. She currently lives in [place] with her father, grandfather, older sister, and younger brother. She currently is homeschooled and in the 10th grade. She has a boyfriend of 2 months. 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 2 years ago, she was caught shoplifting. She reports that her social support system consists of a friend. 05/01/2013 AHW      Single. Lives with father.    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.   Enjoys spending time with her friends.                Past Surgical History:  Procedure Laterality Date  . CHOLECYSTECTOMY N/A 08/08/2016   Procedure: LAPAROSCOPIC CHOLECYSTECTOMY;  Surgeon: Coralie Keens, MD;  Location: [facility];  Service: General;  Laterality: N/A;  . WISDOM TOOTH EXTRACTION      Family History  Problem Relation Age of Onset  . Mental illness Mother   . Drug abuse Mother   . Heart failure Other   . Hypertension Other   . Depression Paternal Aunt   . Anxiety disorder Paternal Aunt   . Stroke Paternal Grandfather     Allergies  Allergen Reactions  . Citrus Itching and Other (See Comments)    SWEATING  . Sulfa Antibiotics Nausea Only, Other (See Comments) and Nausea And Vomiting    FEVER    Current Outpatient Medications on File Prior to Visit  Medication Sig Dispense Refill  . levothyroxine (SYNTHROID, LEVOTHROID) 25 MCG tablet TAKE 1 TAB BY MOUTH EVERY MORNING ON EMPTY STOMACH WITH WATER. NO FOOD OR OTHER MEDS FOR 30 MIN 30 tablet 2  . medroxyPROGESTERone (DEPO-PROVERA) 150 MG/ML injection Inject 150 mg into the muscle every 3 (three) months.    . metoprolol tartrate (LOPRESSOR) 25  MG tablet Take 0.5-1 tablets (12.5-25 mg total) by mouth every 6 (six) hours as needed. For palpitations 30 tablet 1   No current facility-administered medications on file prior to visit.     Wt 144 lb (65.3 kg)   BMI 21.27 kg/m    Objective:   Physical Exam  Constitutional: She is oriented to person, place, and time. She does not have a sickly appearance. She does not appear ill.  HENT:  No difficulty swallowing  Neck:  Mild swelling noted to anterior neck over thyroid gland bilaterally.  Respiratory: Effort normal. No respiratory distress.  Neurological: She is alert and oriented to person, place, and time.  Skin: Skin is dry.  Psychiatric: She has a normal mood and affect.           Assessment & Plan:

## 2019-04-22 NOTE — Patient Instructions (Signed)
Call the front office to schedule a lab appointment.   I'll be in touch soon with results! It was a pleasure to see you today! Allie Bossier, NP-C

## 2019-04-22 NOTE — Assessment & Plan Note (Signed)
Anterior neck swelling is noted bilaterally over thyroid gland. No respiratory distress.   Start by checking TSH. She is taking levothyroxine appropriately.  Ultrasound from February 2020 without nodules.  Consider repeat Ultrasound vs endocrinology referral if needed.  Await results.

## 2019-04-23 ENCOUNTER — Other Ambulatory Visit: Payer: Self-pay

## 2019-04-24 ENCOUNTER — Other Ambulatory Visit (INDEPENDENT_AMBULATORY_CARE_PROVIDER_SITE_OTHER): Payer: Self-pay

## 2019-04-24 DIAGNOSIS — E039 Hypothyroidism, unspecified: Secondary | ICD-10-CM

## 2019-04-24 LAB — TSH: TSH: 4.05 u[IU]/mL (ref 0.35–4.50)

## 2019-04-24 LAB — CBC
HCT: 37 % (ref 36.0–46.0)
Hemoglobin: 12.7 g/dL (ref 12.0–15.0)
MCHC: 34.3 g/dL (ref 30.0–36.0)
MCV: 91.7 fl (ref 78.0–100.0)
Platelets: 235 10*3/uL (ref 150.0–400.0)
RBC: 4.03 Mil/uL (ref 3.87–5.11)
RDW: 13.6 % (ref 11.5–15.5)
WBC: 4.1 10*3/uL (ref 4.0–10.5)

## 2019-04-25 MED ORDER — LEVOTHYROXINE SODIUM 50 MCG PO TABS: ORAL_TABLET | ORAL | 0 refills | Status: DC

## 2019-05-02 ENCOUNTER — Telehealth: Payer: Self-pay | Admitting: Cardiology

## 2019-05-02 ENCOUNTER — Other Ambulatory Visit: Payer: Self-pay

## 2019-05-02 NOTE — Progress Notes (Deleted)
{Choose 1 Note Type (Telehealth Visit or Telephone Visit):[phone]}   Date:  05/02/2019   ID:  Victoria Carson, DOB [birth year], MRN [id number]  Patient Location: Home Provider Location: Office  PCP:  Pleas Koch, NP  Cardiologist:  Shirlee More, MD  Electrophysiologist:  None   Evaluation Performed:  Follow-Up Visit  Chief Complaint:  Palpitations  History of Present Illness:    Victoria Carson is a 23 y.o. female with Hashimoto's thyroid disease goiter hypothyroidism last seen 03/29/19 for palpitation. At that office visit we discussed a monitor as her sister has a history of preexcitations and has had an EP catheter ablation. Her levothyroxine was recently increased from 25mg  to 50mg  daily on 04/24/19 by her PCP.   5/13 labs show Hb 12.7 and TSH of 4.05.    The patient {does/does not:200015} have symptoms concerning for COVID-19 infection (fever, chills, cough, or new shortness of breath).    Past Medical History:  Diagnosis Date  . Chronic cholecystitis 07/2016  . Dental crown present   . Difficulty swallowing pills   . Dust allergy   . GERD (gastroesophageal reflux disease)    no current med.  . Hypothyroidism (acquired)   . IBS (irritable bowel syndrome)   . Migraines    Past Surgical History:  Procedure Laterality Date  . CHOLECYSTECTOMY N/A 08/08/2016   Procedure: LAPAROSCOPIC CHOLECYSTECTOMY;  Surgeon: Coralie Keens, MD;  Location: [facility];  Service: General;  Laterality: N/A;  . WISDOM TOOTH EXTRACTION       No outpatient medications have been marked as taking for the 05/02/19 encounter (Appointment) with Richardo Priest, MD.     Allergies:   Citrus and Sulfa antibiotics   Social History   Tobacco Use  . Smoking status: Never Smoker  . Smokeless tobacco: Never Used  Substance Use Topics  . Alcohol use: Yes    Comment: occassional  . Drug use: No     Family Hx: The patient's family history includes Anxiety disorder in  her paternal aunt; Depression in her paternal aunt; Drug abuse in her mother; Heart failure in an other family member; Hypertension in an other family member; Mental illness in her mother; Stroke in her paternal grandfather.  ROS:   Please see the history of present illness.    *** All other systems reviewed and are negative.   Prior CV studies:   The following studies were reviewed today:  ***  Labs/Other Tests and Data Reviewed:    EKG:  {EKG/Telemetry Strips Reviewed:[id number]}  Recent Labs: 10/29/2018: ALT 12; BUN 9; Creatinine, Ser 0.77; Potassium 3.6; Sodium 142 04/24/2019: Hemoglobin 12.7; Platelets 235.0; TSH 4.05   Recent Lipid Panel No results found for: CHOL, TRIG, HDL, CHOLHDL, LDLCALC, LDLDIRECT  Wt Readings from Last 3 Encounters:  04/22/19 144 lb (65.3 kg)  03/29/19 140 lb (63.5 kg)  03/12/19 144 lb 8 oz (65.5 kg)     Objective:    Vital Signs:  There were no vitals taken for this visit.   {[facility] Virtual Exam (Optional):[phone]::"VITAL SIGNS:  reviewed"}  ASSESSMENT & PLAN:    1. Palpitations -  2. Hypothyroidism - Managed by PCP. Recent increase in levothyroxine.   COVID-19 Education: The signs and symptoms of COVID-19 were discussed with the patient and how to seek care for testing (follow up with PCP or arrange E-visit).  ***The importance of social distancing was discussed today.  Time:   Today, I have spent *** minutes with the patient with telehealth  technology discussing the above problems.     Medication Adjustments/Labs and Tests Ordered: Current medicines are reviewed at length with the patient today.  Concerns regarding medicines are outlined above.   Tests Ordered: No orders of the defined types were placed in this encounter.   Medication Changes: No orders of the defined types were placed in this encounter.   Disposition:  Follow up {follow up:15908}  Signed, Shirlee More, MD  05/02/2019 9:16 AM    [place]

## 2019-05-07 ENCOUNTER — Telehealth (INDEPENDENT_AMBULATORY_CARE_PROVIDER_SITE_OTHER): Payer: Self-pay | Admitting: Primary Care

## 2019-05-07 DIAGNOSIS — F411 Generalized anxiety disorder: Secondary | ICD-10-CM

## 2019-05-07 DIAGNOSIS — F332 Major depressive disorder, recurrent severe without psychotic features: Secondary | ICD-10-CM

## 2019-05-07 MED ORDER — BUPROPION HCL ER (SR) 100 MG PO TB12: 100.0000 mg | ORAL_TABLET | Freq: Two times a day (BID) | ORAL | 1 refills | Status: DC

## 2019-05-07 NOTE — Patient Instructions (Signed)
Start bupropion SR 100 mg tablets. Start by taking 1 tablet daily for 5 days, then increase to 1 tablet twice daily thereafter.   Call Dr. Cheryln Manly for an appointment as discussed.  Schedule a follow up visit for 6 weeks for re-evaluation.   It was a pleasure to see you today! Allie Bossier, NP-C

## 2019-05-07 NOTE — Assessment & Plan Note (Signed)
Chronic, intermittent, currently active. GAD 7 score of 15 and PHQ 9 score of 18 today. Intolerant to SSRI, SNRI, Remeron.  Discussed option for treatment including psychiatry, therapy, trying a different class of medication through me. She would like to try another medication with our clinic before going to psychiatry, this seems reasonable.  Will trial low dose Wellbutrin SR 100 mg BID. Also strongly recommended she call her therapist for another appointment, she will do this.   We will see her back in 6 weeks for re-evaluation. She will update sooner if problems arrive.

## 2019-05-07 NOTE — Progress Notes (Signed)
Subjective:    Patient ID: Victoria Carson, female    DOB: Oct 02, 1996, 23 y.o.   MRN: [id number]  HPI  Virtual Visit via Video Note  I connected with Corey Skains Surratt on 05/07/19 at 11:20 AM EDT by a video enabled telemedicine application and verified that I am speaking with the correct person using two identifiers.  Location: Patient: Home Provider: Office   I discussed the limitations of evaluation and management by telemedicine and the availability of in person appointments. The patient expressed understanding and agreed to proceed.  History of Present Illness:  [name] is a 23 year old female with a history of hypothyroidism, GAD, MDD who presents today with a chief complaint of depression and anxiety.  Chronic and intermittent. Most recent symptoms began in June 2019. She  continued to noticed symptoms around December 2019 when she was diagnosed with hypothyroidism. She thought that with treatment of hypothyroidism that her symptoms would improve but she's not noticed a difference. She is compliant to her levothyroxine and is taking appropriately.   Symptoms include little motivation to do anything, daily worry, irritability, feeling down. She was once seeing a therapist for depression/anxiety with improvement, but has not been seen since March 2019. She was once managed on Lexapro which caused an increase in depression. Paxil and Remeron caused GI upset, Effexor caused her to feel angry. PHQ 9 score of 18 today and GAD 7 score 15.    Observations/Objective:  Alert and oriented. Appears well, not sickly. No distress. Speaking in complete sentences.   Assessment and Plan:  Chronic, intermittent, currently active. GAD 7 score of 15 and PHQ 9 score of 18 today. Intolerant to SSRI, SNRI, Remeron.  Discussed option for treatment including psychiatry, therapy, trying a different class of medication through me. She would like to try another medication with our clinic  before going to psychiatry, this seems reasonable.  Will trial low dose Wellbutrin SR 100 mg BID. Also strongly recommended she call her therapist for another appointment, she will do this.   We will see her back in 6 weeks for re-evaluation. She will update sooner if problems arrive.  Follow Up Instructions:  Start bupropion SR 100 mg tablets. Start by taking 1 tablet daily for 5 days, then increase to 1 tablet twice daily thereafter.   Call Dr. Cheryln Manly for an appointment as discussed.  Schedule a follow up visit for 6 weeks for re-evaluation.   It was a pleasure to see you today! Allie Bossier, NP-C    I discussed the assessment and treatment plan with the patient. The patient was provided an opportunity to ask questions and all were answered. The patient agreed with the plan and demonstrated an understanding of the instructions.   The patient was advised to call back or seek an in-person evaluation if the symptoms worsen or if the condition fails to improve as anticipated.     Pleas Koch, NP    Review of Systems  Respiratory: Negative for shortness of breath.   Cardiovascular: Negative for chest pain and palpitations.  Endocrine: Negative for cold intolerance.  Psychiatric/Behavioral: Negative for suicidal ideas. The patient is nervous/anxious.        See HPI       Past Medical History:  Diagnosis Date  . Chronic cholecystitis 07/2016  . Dental crown present   . Difficulty swallowing pills   . Dust allergy   . GERD (gastroesophageal reflux disease)    no current med.  Marland Kitchen  Hypothyroidism (acquired)   . IBS (irritable bowel syndrome)   . Migraines      Social History   Socioeconomic History  . Marital status: Single    Spouse name: n/a  . Number of children: 0  . Years of education: Not on file  . Highest education level: Not on file  Occupational History  . 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 Financial  resource strain: Not on file  . Food insecurity:    Worry: Not on file    Inability: Not on file  . Transportation needs:    Medical: Not on file    Non-medical: Not on file  Tobacco Use  . Smoking status: Never Smoker  . Smokeless tobacco: Never Used  Substance and Sexual Activity  . Alcohol use: Yes    Comment: occassional  . Drug use: No  . Sexual activity: Never    Partners: Male    Birth control/protection: Inserts  Lifestyle  . Physical activity:    Days per week: Not on file    Minutes per session: Not on file  . Stress: Not on file  Relationships  . Social connections:    Talks on phone: Not on file    Gets together: Not on file    Attends religious service: Not on file    Active member of club or organization: Not on file    Attends meetings of clubs or organizations: Not on file    Relationship status: Not on file  . Intimate partner violence:    Fear of current or ex partner: Not on file    Emotionally abused: Not on file    Physically abused: Not on file    Forced sexual activity: Not on file  Other Topics Concern  . Not on file  Social History Narrative   05/01/2013 AHW Caleb was poor in [place], [place].. She currently lives in [place] with her father, grandfather, older sister, and younger brother. She currently is homeschooled and in the 10th grade. She has a boyfriend of 2 months. 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 2 years ago, she was caught shoplifting. She reports that her social support system consists of a friend. 05/01/2013 AHW      Single. Lives with father.    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   Enjoys spending time with her friends.                Past Surgical History:  Procedure Laterality Date  . CHOLECYSTECTOMY N/A 08/08/2016   Procedure: LAPAROSCOPIC CHOLECYSTECTOMY;  Surgeon: Coralie Keens, MD;  Location: [facility];  Service: General;  Laterality: N/A;  .  WISDOM TOOTH EXTRACTION      Family History  Problem Relation Age of Onset  . Mental illness Mother   . Drug abuse Mother   . Heart failure Other   . Hypertension Other   . Depression Paternal Aunt   . Anxiety disorder Paternal Aunt   . Stroke Paternal Grandfather     Allergies  Allergen Reactions  . Citrus Itching and Other (See Comments)    SWEATING  . Sulfa Antibiotics Nausea Only, Other (See Comments) and Nausea And Vomiting    FEVER    Current Outpatient Medications on File Prior to Visit  Medication Sig Dispense Refill  . levothyroxine (SYNTHROID) 50 MCG tablet Take 1 tablet by mouth every morning on an empty stomach. No  food or other medications for 30 minutes. 30 tablet 0  . medroxyPROGESTERone (DEPO-PROVERA) 150 MG/ML injection Inject 150 mg into the muscle every 3 (three) months.     No current facility-administered medications on file prior to visit.     There were no vitals taken for this visit.   Objective:   Physical Exam  Constitutional: She is oriented to person, place, and time. She appears well-nourished.  Respiratory: Effort normal.  Neurological: She is alert and oriented to person, place, and time.  Psychiatric: She has a normal mood and affect.           Assessment & Plan:

## 2019-05-28 ENCOUNTER — Ambulatory Visit (INDEPENDENT_AMBULATORY_CARE_PROVIDER_SITE_OTHER): Payer: Self-pay | Admitting: Psychology

## 2019-05-28 DIAGNOSIS — E039 Hypothyroidism, unspecified: Secondary | ICD-10-CM

## 2019-05-28 DIAGNOSIS — F411 Generalized anxiety disorder: Secondary | ICD-10-CM

## 2019-05-29 ENCOUNTER — Other Ambulatory Visit: Payer: Self-pay

## 2019-05-29 ENCOUNTER — Ambulatory Visit: Payer: Self-pay

## 2019-06-01 ENCOUNTER — Encounter (HOSPITAL_COMMUNITY): Payer: Self-pay | Admitting: Emergency Medicine

## 2019-06-01 ENCOUNTER — Other Ambulatory Visit: Payer: Self-pay

## 2019-06-01 ENCOUNTER — Emergency Department (HOSPITAL_COMMUNITY)
Admission: EM | Admit: 2019-06-01 | Discharge: 2019-06-02 | Disposition: A | Discharge status: Home / Self Care | Payer: Self-pay | Attending: Emergency Medicine | Admitting: Emergency Medicine

## 2019-06-01 DIAGNOSIS — Z5321 Procedure and treatment not carried out due to patient leaving prior to being seen by health care provider: Secondary | ICD-10-CM | POA: Insufficient documentation

## 2019-06-01 DIAGNOSIS — R079 Chest pain, unspecified: Secondary | ICD-10-CM | POA: Insufficient documentation

## 2019-06-01 LAB — CBC
HCT: 41 % (ref 36.0–46.0)
Hemoglobin: 13.5 g/dL (ref 12.0–15.0)
MCH: 30.8 pg (ref 26.0–34.0)
MCHC: 32.9 g/dL (ref 30.0–36.0)
MCV: 93.6 fL (ref 80.0–100.0)
Platelets: 250 10*3/uL (ref 150–400)
RBC: 4.38 MIL/uL (ref 3.87–5.11)
RDW: 12.4 % (ref 11.5–15.5)
WBC: 4.9 10*3/uL (ref 4.0–10.5)
nRBC: 0 % (ref 0.0–0.2)

## 2019-06-01 LAB — I-STAT BETA HCG BLOOD, ED (MC, WL, AP ONLY): I-stat hCG, quantitative: <5 m[IU]/mL (ref <5)

## 2019-06-01 LAB — COMPREHENSIVE METABOLIC PANEL
ALT: 12 U/L (ref 0–44)
AST: 16 U/L (ref 15–41)
Albumin: 4.1 g/dL (ref 3.5–5.0)
Alkaline Phosphatase: 74 U/L (ref 38–126)
Anion gap: 8 (ref 5–15)
BUN: 8 mg/dL (ref 6–20)
CO2: 23 mmol/L (ref 22–32)
Calcium: 9.1 mg/dL (ref 8.9–10.3)
Chloride: 110 mmol/L (ref 98–111)
Creatinine, Ser: 0.89 mg/dL (ref 0.44–1.00)
GFR calc Af Amer: >60 mL/min (ref >60)
GFR calc non Af Amer: >60 mL/min (ref >60)
Glucose, Bld: 95 mg/dL (ref 70–99)
Potassium: 3.6 mmol/L (ref 3.5–5.1)
Sodium: 141 mmol/L (ref 135–145)
Total Bilirubin: 0.5 mg/dL (ref 0.3–1.2)
Total Protein: 6.8 g/dL (ref 6.5–8.1)

## 2019-06-01 LAB — LIPASE, BLOOD: Lipase: 36 U/L (ref 11–51)

## 2019-06-01 MED ORDER — SODIUM CHLORIDE 0.9% FLUSH: 3.0000 mL | Freq: Once | INTRAVENOUS | Status: DC

## 2019-06-01 NOTE — ED Triage Notes (Signed)
Pt c/o "brain fog", chest pain, palpitations, shaking. +n/d, denies emesis.  Pt has hx of hashimoto's Thyroiditis, pt compliant with Synthroid.

## 2019-06-02 LAB — TSH: TSH: 5.625 u[IU]/mL — ABNORMAL HIGH (ref 0.350–4.500)

## 2019-06-02 NOTE — ED Notes (Signed)
Pt seen leaving the ED, did not make staff aware she was leaving and di not return.

## 2019-06-04 LAB — T3: T3, Total: 99 ng/dL (ref 71–180)

## 2019-06-05 ENCOUNTER — Encounter: Payer: Self-pay | Admitting: Primary Care

## 2019-06-05 ENCOUNTER — Ambulatory Visit (INDEPENDENT_AMBULATORY_CARE_PROVIDER_SITE_OTHER): Payer: Self-pay | Admitting: Primary Care

## 2019-06-05 ENCOUNTER — Other Ambulatory Visit: Payer: Self-pay

## 2019-06-05 VITALS — BP 108/66 | HR 88 | Temp 98.1°F | Ht 69.0 in | Wt 147.5 lb

## 2019-06-05 DIAGNOSIS — R29898 Other symptoms and signs involving the musculoskeletal system: Secondary | ICD-10-CM | POA: Insufficient documentation

## 2019-06-05 DIAGNOSIS — E039 Hypothyroidism, unspecified: Secondary | ICD-10-CM

## 2019-06-05 DIAGNOSIS — R5383 Other fatigue: Secondary | ICD-10-CM

## 2019-06-05 DIAGNOSIS — F411 Generalized anxiety disorder: Secondary | ICD-10-CM

## 2019-06-05 DIAGNOSIS — F332 Major depressive disorder, recurrent severe without psychotic features: Secondary | ICD-10-CM

## 2019-06-05 NOTE — Patient Instructions (Signed)
Stop by the lab prior to leaving today. I will notify you of your results once received.   It was a pleasure to see you today!  

## 2019-06-05 NOTE — Assessment & Plan Note (Signed)
Overall compliant to levothyroxine with the exception of two days last week. TSH too high last week which may suggest that she is under treated. I doubt that by missing two doses this would have effected her TSH that way.  Repeat TSH pending. She is taking levothyroxine appropriately.

## 2019-06-05 NOTE — Assessment & Plan Note (Signed)
I do suspect underlying anxiety and depression to be contributing to symptoms. She did start to notice some improvement on Wellbutrin which is a good sign.   We will await lab results, but I do believe she would benefit from resuming treatment.

## 2019-06-05 NOTE — Progress Notes (Signed)
Subjective:    Patient ID: Victoria Carson, female    DOB: 26-Nov-1996, 23 y.o.   MRN: [id number]  HPI  Victoria Carson is a 23 year old female who presents today with a multiple complaints.   She is currently managed on levothyroxine 50 mcg which was increased on 04/24/19 given symptoms of continued fatigue and a TSH level of 4.03. Since then she's been compliant to her levothyroxine and has been taking with water only, no other food or medications for 30 minutes.  Last week she started developing "brain fog" so she decided to skip her levothyroxine twice last week, felt better when she didn't take the medication and has resumed her medication daily for the last four days. She presented to the ED on 06/01/19 as she "felt like a mess", had labs drawn but left before being seen by a provider. TSH was drawn which was 5.625.  Symptoms include chest pressure, increased stress, fatigued, sleeping for 10-12 hours at a time, not wanting to get out of bed, feeling nervous/anxious, joint pain to numerous joints, hot flashes and then will feel cold, tearfulness, shaking to bilateral upper and lower extremities, numbness to her lower extremities (began 6 days ago). She will be standing for short and long periods of time and feel like her legs and arms are like jelly.   She met with her therapist on 05/28/19 who recommended she stop taking Wellbutrin as he didn't believe her symptoms were related to depression or anxiety. He recommended she be referred to endocrinology for further evaluation and management of hypothyroidism. She has not had her Wellbutrin since then. She thought that she may have felt slightly better while taking Wellbutrin only took for two weeks, did have some insomnia.    Review of Systems  Constitutional: Positive for fatigue.  Respiratory: Negative for shortness of breath.   Cardiovascular: Negative for chest pain.  Musculoskeletal: Positive for arthralgias.  Neurological: Positive for  weakness and numbness.  Psychiatric/Behavioral: Negative for sleep disturbance. The patient is nervous/anxious.        See HPI       Past Medical History:  Diagnosis Date  . Chronic cholecystitis 07/2016  . Dental crown present   . Difficulty swallowing pills   . Dust allergy   . GERD (gastroesophageal reflux disease)    no current med.  . Hypothyroidism (acquired)   . IBS (irritable bowel syndrome)   . Migraines      Social History   Socioeconomic History  . Marital status: Single    Spouse name: n/a  . Number of children: 0  . Years of education: Not on file  . Highest education level: Not on file  Occupational History  . 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 Financial resource strain: Not on file  . Food insecurity    Worry: Not on file    Inability: Not on file  . Transportation needs    Medical: Not on file    Non-medical: Not on file  Tobacco Use  . Smoking status: Never Smoker  . Smokeless tobacco: Never Used  Substance and Sexual Activity  . Alcohol use: Yes    Comment: occassional  . Drug use: No  . Sexual activity: Never    Partners: Male    Birth control/protection: Inserts  Lifestyle  . Physical activity    Days per week: Not on file    Minutes per session: Not on file  .  Stress: Not on file  Relationships  . Social Herbalist on phone: Not on file    Gets together: Not on file    Attends religious service: Not on file    Active member of club or organization: Not on file    Attends meetings of clubs or organizations: Not on file    Relationship status: Not on file  . Intimate partner violence    Fear of current or ex partner: Not on file    Emotionally abused: Not on file    Physically abused: Not on file    Forced sexual activity: Not on file  Other Topics Concern  . Not on file  Social History Narrative   05/01/2013 AHW Pria was poor in [place], [place].. She currently lives  in [place] with her father, grandfather, older sister, and younger brother. She currently is homeschooled and in the 10th grade. She has a boyfriend of 2 months. 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 2 years ago, she was caught shoplifting. She reports that her social support system consists of a friend. 05/01/2013 AHW      Single. Lives with father.    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   Enjoys spending time with her friends.                Past Surgical History:  Procedure Laterality Date  . CHOLECYSTECTOMY N/A 08/08/2016   Procedure: LAPAROSCOPIC CHOLECYSTECTOMY;  Surgeon: Coralie Keens, MD;  Location: [facility];  Service: General;  Laterality: N/A;  . WISDOM TOOTH EXTRACTION      Family History  Problem Relation Age of Onset  . Mental illness Mother   . Drug abuse Mother   . Heart failure Other   . Hypertension Other   . Depression Paternal Aunt   . Anxiety disorder Paternal Aunt   . Stroke Paternal Grandfather     Allergies  Allergen Reactions  . Citrus Itching and Other (See Comments)    SWEATING  . Sulfa Antibiotics Nausea Only, Other (See Comments) and Nausea And Vomiting    FEVER    Current Outpatient Medications on File Prior to Visit  Medication Sig Dispense Refill  . levothyroxine (SYNTHROID) 50 MCG tablet Take 1 tablet by mouth every morning on an empty stomach. No food or other medications for 30 minutes. 30 tablet 0  . medroxyPROGESTERone (DEPO-PROVERA) 150 MG/ML injection Inject 150 mg into the muscle every 3 (three) months.    Marland Kitchen buPROPion (WELLBUTRIN SR) 100 MG 12 hr tablet Take 1 tablet (100 mg total) by mouth 2 (two) times daily. For anxiety and depression. (Patient not taking: Reported on 06/05/2019) 60 tablet 1   No current facility-administered medications on file prior to visit.     BP 108/66   Pulse 88   Temp 98.1 F (36.7 C) (Oral)   Ht '5\' 9"'$  (1.753 m)   Wt 147 lb 8 oz (66.9 kg)    LMP 05/14/2019   SpO2 98%   BMI 21.78 kg/m    Objective:   Physical Exam  Constitutional: She appears well-nourished.  Neck: Neck supple.  Cardiovascular: Normal rate and regular rhythm.  Respiratory: Effort normal and breath sounds normal.  Musculoskeletal: Normal range of motion.     Comments: 5/5 strength to bilateral upper and lower extremities   Neurological: No cranial nerve deficit.  Skin: Skin is warm and dry.  Psychiatric: She has a normal mood and affect.  Assessment & Plan:

## 2019-06-05 NOTE — Assessment & Plan Note (Signed)
Intermittent for the last 6 days, no numbness/falls. Exam today unremarkable. Consider neuro evaluation to rule out MS or other neurological disorder. Await labs.

## 2019-06-05 NOTE — Assessment & Plan Note (Signed)
Chronic, continued. Could be secondary to under treated hypothyroidism, other metabolic cause, anxiety/depression.  Work up in process.

## 2019-06-06 LAB — BASIC METABOLIC PANEL
BUN: 8 mg/dL (ref 6–23)
CO2: 24 mEq/L (ref 19–32)
Calcium: 9.3 mg/dL (ref 8.4–10.5)
Chloride: 106 mEq/L (ref 96–112)
Creatinine, Ser: 0.73 mg/dL (ref 0.40–1.20)
GFR: 99.59 mL/min (ref >60.00)
Glucose, Bld: 87 mg/dL (ref 70–99)
Potassium: 4.2 mEq/L (ref 3.5–5.1)
Sodium: 139 mEq/L (ref 135–145)

## 2019-06-06 LAB — TSH: TSH: 4.36 u[IU]/mL (ref 0.35–4.50)

## 2019-06-06 LAB — T4: T4, Total: 8 ug/dL (ref 4.5–12.0)

## 2019-06-06 LAB — VITAMIN B12: Vitamin B-12: 214 pg/mL (ref 211–911)

## 2019-06-06 LAB — VITAMIN D 25 HYDROXY (VIT D DEFICIENCY, FRACTURES): VITD: 28.29 ng/mL — ABNORMAL LOW (ref 30.00–100.00)

## 2019-06-07 ENCOUNTER — Other Ambulatory Visit: Payer: Self-pay | Admitting: Primary Care

## 2019-06-07 DIAGNOSIS — E039 Hypothyroidism, unspecified: Secondary | ICD-10-CM

## 2019-06-07 MED ORDER — SYNTHROID 75 MCG PO TABS: ORAL_TABLET | ORAL | 1 refills | Status: DC

## 2019-06-13 NOTE — Telephone Encounter (Signed)
Raquel Sarna, will you cancel her upcoming appointment? Thanks!

## 2019-06-17 ENCOUNTER — Other Ambulatory Visit: Payer: Self-pay

## 2019-06-17 ENCOUNTER — Ambulatory Visit: Payer: Self-pay | Admitting: Primary Care

## 2019-06-21 NOTE — Telephone Encounter (Signed)
Victoria Carson, patient prefers [facility] and is willing to wait. Can we cancel with [facility]?

## 2019-06-26 ENCOUNTER — Ambulatory Visit (INDEPENDENT_AMBULATORY_CARE_PROVIDER_SITE_OTHER): Payer: Self-pay | Admitting: Psychology

## 2019-06-26 DIAGNOSIS — F411 Generalized anxiety disorder: Secondary | ICD-10-CM

## 2019-06-29 ENCOUNTER — Other Ambulatory Visit: Payer: Self-pay | Admitting: Primary Care

## 2019-06-29 DIAGNOSIS — E039 Hypothyroidism, unspecified: Secondary | ICD-10-CM

## 2019-07-03 ENCOUNTER — Other Ambulatory Visit: Payer: Self-pay | Admitting: Primary Care

## 2019-07-03 DIAGNOSIS — E039 Hypothyroidism, unspecified: Secondary | ICD-10-CM

## 2019-07-04 ENCOUNTER — Telehealth: Payer: Self-pay

## 2019-07-04 NOTE — Telephone Encounter (Signed)
Copied from [place] [id number]. Topic: Appointment Scheduling - Scheduling Inquiry for Clinic >> Jul 04, 2019 11:44 AM Scherrie Gerlach wrote: Reason for CRM:  pt would like to know if Dr Deborra Medina will accept her as a pt? Pt has a thyroid issues, but states it is not bad right now. Pt also has no insurance, will be self pay.

## 2019-07-05 NOTE — Telephone Encounter (Signed)
TB-TA is ok with taking this new pt/can you please schedule her appropriately/thx dmf

## 2019-07-05 NOTE — Telephone Encounter (Signed)
TA-Plz see note below/pt asking if you will take her on as a new self-pay pt? Plz advise/thx dmf

## 2019-07-05 NOTE — Telephone Encounter (Signed)
Yes I am okay with that.

## 2019-07-11 ENCOUNTER — Telehealth: Payer: Self-pay

## 2019-07-11 NOTE — Telephone Encounter (Signed)
Left detailed VM w COVID screen and back door lab info   

## 2019-07-15 ENCOUNTER — Other Ambulatory Visit: Payer: Self-pay

## 2019-07-27 ENCOUNTER — Other Ambulatory Visit: Payer: Self-pay | Admitting: Primary Care

## 2019-07-27 DIAGNOSIS — E039 Hypothyroidism, unspecified: Secondary | ICD-10-CM

## 2019-07-29 ENCOUNTER — Encounter: Payer: Self-pay | Admitting: Family Medicine

## 2019-07-30 ENCOUNTER — Other Ambulatory Visit: Payer: Self-pay

## 2019-07-30 ENCOUNTER — Encounter: Payer: Self-pay | Admitting: Family Medicine

## 2019-07-30 ENCOUNTER — Emergency Department (HOSPITAL_COMMUNITY)
Admission: EM | Admit: 2019-07-30 | Discharge: 2019-07-30 | Disposition: A | Discharge status: Home / Self Care | Payer: Self-pay | Attending: Emergency Medicine | Admitting: Emergency Medicine

## 2019-07-30 ENCOUNTER — Encounter (HOSPITAL_COMMUNITY): Payer: Self-pay | Admitting: Emergency Medicine

## 2019-07-30 ENCOUNTER — Emergency Department (HOSPITAL_COMMUNITY): Payer: Self-pay

## 2019-07-30 DIAGNOSIS — R0789 Other chest pain: Secondary | ICD-10-CM | POA: Insufficient documentation

## 2019-07-30 DIAGNOSIS — E063 Autoimmune thyroiditis: Secondary | ICD-10-CM | POA: Insufficient documentation

## 2019-07-30 DIAGNOSIS — Z79899 Other long term (current) drug therapy: Secondary | ICD-10-CM | POA: Insufficient documentation

## 2019-07-30 LAB — COMPREHENSIVE METABOLIC PANEL
ALT: 19 U/L (ref 0–44)
AST: 20 U/L (ref 15–41)
Albumin: 3.9 g/dL (ref 3.5–5.0)
Alkaline Phosphatase: 74 U/L (ref 38–126)
Anion gap: 8 (ref 5–15)
BUN: 12 mg/dL (ref 6–20)
CO2: 23 mmol/L (ref 22–32)
Calcium: 9 mg/dL (ref 8.9–10.3)
Chloride: 108 mmol/L (ref 98–111)
Creatinine, Ser: 0.65 mg/dL (ref 0.44–1.00)
GFR calc Af Amer: >60 mL/min (ref >60)
GFR calc non Af Amer: >60 mL/min (ref >60)
Glucose, Bld: 93 mg/dL (ref 70–99)
Potassium: 3.8 mmol/L (ref 3.5–5.1)
Sodium: 139 mmol/L (ref 135–145)
Total Bilirubin: 0.8 mg/dL (ref 0.3–1.2)
Total Protein: 6.9 g/dL (ref 6.5–8.1)

## 2019-07-30 LAB — CBC WITH DIFFERENTIAL/PLATELET
Abs Immature Granulocytes: 0.01 10*3/uL (ref 0.00–0.07)
Basophils Absolute: 0 10*3/uL (ref 0.0–0.1)
Basophils Relative: 1 %
Eosinophils Absolute: 0.1 10*3/uL (ref 0.0–0.5)
Eosinophils Relative: 3 %
HCT: 40.2 % (ref 36.0–46.0)
Hemoglobin: 13.3 g/dL (ref 12.0–15.0)
Immature Granulocytes: 0 %
Lymphocytes Relative: 36 %
Lymphs Abs: 1.5 10*3/uL (ref 0.7–4.0)
MCH: 31.1 pg (ref 26.0–34.0)
MCHC: 33.1 g/dL (ref 30.0–36.0)
MCV: 93.9 fL (ref 80.0–100.0)
Monocytes Absolute: 0.5 10*3/uL (ref 0.1–1.0)
Monocytes Relative: 13 %
Neutro Abs: 2 10*3/uL (ref 1.7–7.7)
Neutrophils Relative %: 47 %
Platelets: 216 10*3/uL (ref 150–400)
RBC: 4.28 MIL/uL (ref 3.87–5.11)
RDW: 12.3 % (ref 11.5–15.5)
WBC: 4.2 10*3/uL (ref 4.0–10.5)
nRBC: 0 % (ref 0.0–0.2)

## 2019-07-30 LAB — I-STAT BETA HCG BLOOD, ED (MC, WL, AP ONLY): I-stat hCG, quantitative: <5 m[IU]/mL (ref <5)

## 2019-07-30 LAB — URINALYSIS, ROUTINE W REFLEX MICROSCOPIC
Bilirubin Urine: NEGATIVE
Glucose, UA: NEGATIVE mg/dL
Hgb urine dipstick: NEGATIVE
Ketones, ur: NEGATIVE mg/dL
Leukocytes,Ua: NEGATIVE
Nitrite: NEGATIVE
Protein, ur: NEGATIVE mg/dL
Specific Gravity, Urine: 1.012 (ref 1.005–1.030)
pH: 6 (ref 5.0–8.0)

## 2019-07-30 LAB — D-DIMER, QUANTITATIVE: D-Dimer, Quant: <0.27 ug/mL-FEU (ref 0.00–0.50)

## 2019-07-30 MED ORDER — IBUPROFEN 800 MG PO TABS
800.0000 mg | ORAL_TABLET | Freq: Once | ORAL | Status: DC
  Filled 2019-07-30: qty 1

## 2019-07-30 MED ORDER — NAPROXEN 375 MG PO TABS: 375.0000 mg | ORAL_TABLET | Freq: Two times a day (BID) | ORAL | 0 refills | Status: DC

## 2019-07-30 MED ORDER — ONDANSETRON 4 MG PO TBDP
4.0000 mg | ORAL_TABLET | Freq: Once | ORAL | Status: AC
  Administered 2019-07-30: 10:00:00 4 mg via ORAL
  Filled 2019-07-30: qty 1

## 2019-07-30 NOTE — ED Triage Notes (Signed)
Pt c/o chest tightness/pressure that started last night for a couple hours and then again this morning.

## 2019-07-30 NOTE — ED Provider Notes (Signed)
[facility] DEPT Provider Note   CSN: [id number] Arrival date & time: 07/30/19  4315    History   Chief Complaint Chief Complaint  Patient presents with  . Chest Pain    HPI Victoria Carson is a 23 y.o. female.     HPI   Pt is a 23 y/o female with a h/o hashimotos, interstitial cystitis, IBS, migraines, cholecystectomy, who presents to the ED c/o CP.   Pt states that she has had CP since march. States that sxs have been intermittent, and episodes usually last seconds to minutes but last night she had an episode that lasted about 2.5 hours. Pain has been waxing and waning since then. States she was standing when her sxs started last night. She has not noticed that it has been worse with exertion. Describes sxs as a pressure/tightness. Pain located midsternally and radiates to her back. Rates pain 8/10. States that her HR ranged from 80s-160 during the episode last night. Pain is worse with inspiration. She feels like she cannot breathe well because it hurts.  Denies cough or fevers. Denies known COVID contacts. Denies abd pain, NVD, She reports dysuria, frequency, and suprapubic pressure. States sxs feel consist with IC episode.   Is on the depo shot, but missed the last one. Last month drove 8 hours to the [place].  Denies leg pain/swelling, hemoptysis, recent surgery/trauma, personal hx of cancer, or hx of DVT/PE.   States she has seen her pcp for this and has been referred to cardiology but has not followed up yet because her sxs were improving.   Past Medical History:  Diagnosis Date  . Chronic cholecystitis 07/2016  . Dental crown present   . Difficulty swallowing pills   . Dust allergy   . GERD (gastroesophageal reflux disease)    no current med.  . Hypothyroidism (acquired)   . IBS (irritable bowel syndrome)   . Migraines     Patient Active Problem List   Diagnosis Date Noted  . Lower extremity weakness 06/05/2019  . Palpitations  03/12/2019  . Hypothyroidism 01/24/2019  . Fatigue 07/06/2018  . Acne vulgaris 10/25/2017  . Interstitial cystitis 10/17/2016  . Encounter for birth control 10/17/2016  . MDD (major depressive disorder), recurrent episode, severe ([place]) 02/15/2013  . GAD (generalized anxiety disorder) 02/15/2013    Past Surgical History:  Procedure Laterality Date  . CHOLECYSTECTOMY N/A 08/08/2016   Procedure: LAPAROSCOPIC CHOLECYSTECTOMY;  Surgeon: Coralie Keens, MD;  Location: [facility];  Service: General;  Laterality: N/A;  . WISDOM TOOTH EXTRACTION       OB History   No obstetric history on file.      Home Medications    Prior to Admission medications   Medication Sig Start Date End Date Taking? Authorizing Provider  Cyanocobalamin (VITAMIN B 12 PO) Take 1 tablet by mouth daily.   Yes [provider]  medroxyPROGESTERone (DEPO-PROVERA) 150 MG/ML injection Inject 150 mg into the muscle every 3 (three) months.   Yes [provider]  SYNTHROID 75 MCG tablet TAKE 1 TABLET BY MOUTH EVERY MORNING ON AN EMPTY STOMACH. NO FOOD OR OTHER MEDICATIONS FOR 30 MINS Patient taking differently: Take 75 mcg by mouth daily before breakfast. TAKE ON AN EMPTY STOMACH. NO FOOD OR OTHER MEDICATIONS FOR 30 MINS 07/29/19  Yes Pleas Koch, NP  VITAMIN D PO Take 1,000 Units by mouth daily.   Yes [provider]  buPROPion (WELLBUTRIN SR) 100 MG 12 hr tablet  Take 1 tablet (100 mg total) by mouth 2 (two) times daily. For anxiety and depression. Patient not taking: Reported on 06/05/2019 05/07/19   Pleas Koch, NP  naproxen (NAPROSYN) 375 MG tablet Take 1 tablet (375 mg total) by mouth 2 (two) times daily for 7 days. 07/30/19 08/06/19  Annita Ratliff S, PA-C    Family History Family History  Problem Relation Age of Onset  . Mental illness Mother   . Drug abuse Mother   . Heart failure Other   . Hypertension Other   . Depression Paternal Aunt   . Anxiety  disorder Paternal Aunt   . Stroke Paternal Grandfather     Social History Social History   Tobacco Use  . Smoking status: Never Smoker  . Smokeless tobacco: Never Used  Substance Use Topics  . Alcohol use: Yes    Comment: occassional  . Drug use: No     Allergies   Citrus and Sulfa antibiotics   Review of Systems Review of Systems  Constitutional: Negative for chills and fever.  HENT: Negative for ear pain and sore throat.   Eyes: Negative for pain and visual disturbance.  Respiratory: Positive for shortness of breath. Negative for cough.   Cardiovascular: Positive for chest pain.  Gastrointestinal: Negative for abdominal pain, constipation, diarrhea, nausea and vomiting.  Genitourinary: Positive for dysuria and frequency. Negative for hematuria.  Musculoskeletal: Negative for back pain.  Skin: Negative for rash.  Neurological: Negative for headaches.  All other systems reviewed and are negative.   Physical Exam Updated Vital Signs BP 121/74   Pulse 76   Temp 98.6 F (37 C) (Oral)   Resp (!) 25   SpO2 100%   Physical Exam Vitals signs and nursing note reviewed.  Constitutional:      General: She is not in acute distress.    Appearance: She is well-developed. She is not ill-appearing or toxic-appearing.  HENT:     Head: Normocephalic and atraumatic.  Eyes:     Conjunctiva/sclera: Conjunctivae normal.  Neck:     Musculoskeletal: Neck supple.     Thyroid: Thyromegaly present.  Cardiovascular:     Rate and Rhythm: Normal rate and regular rhythm.     Heart sounds: No murmur.  Pulmonary:     Effort: Pulmonary effort is normal. No respiratory distress.     Breath sounds: Normal breath sounds. No decreased breath sounds, wheezing, rhonchi or rales.  Chest:     Chest wall: Tenderness (midsternal chest ttp that reproduces pain) present.  Abdominal:     Palpations: Abdomen is soft.     Tenderness: There is no abdominal tenderness.  Musculoskeletal:     Right  lower leg: She exhibits no tenderness. No edema.     Left lower leg: She exhibits no tenderness. No edema.  Skin:    General: Skin is warm and dry.  Neurological:     Mental Status: She is alert.    ED Treatments / Results  Labs (all labs ordered are listed, but only abnormal results are displayed) Labs Reviewed  CBC WITH DIFFERENTIAL/PLATELET  COMPREHENSIVE METABOLIC PANEL  D-DIMER, QUANTITATIVE (NOT AT [facility])  URINALYSIS, ROUTINE W REFLEX MICROSCOPIC  I-STAT BETA HCG BLOOD, ED ([facility], [facility], [facility] ONLY)    EKG EKG Interpretation  Date/Time:  Tuesday July 30 2019 09:46:43 EDT Ventricular Rate:  80 PR Interval:    QRS Duration: 74 QT Interval:  384 QTC Calculation: 443 R Axis:   87 Text Interpretation:  Sinus rhythm Normal  ECG Confirmed by Blanchie Dessert [phone]) on 07/30/2019 9:50:53 AM   Radiology Dg Chest Portable 1 View  Result Date: 07/30/2019 CLINICAL DATA:  Chest pain. EXAM: PORTABLE CHEST 1 VIEW COMPARISON:  Radiograph of November 20, 2007. FINDINGS: The heart size and mediastinal contours are within normal limits. Both lungs are clear. No pneumothorax or pleural effusion is noted. The visualized skeletal structures are unremarkable. IMPRESSION: No active disease. Electronically Signed   By: Marijo Conception M.D.   On: 07/30/2019 10:43    Procedures Procedures (including critical care time)  Medications Ordered in ED Medications  ondansetron (ZOFRAN-ODT) disintegrating tablet 4 mg (4 mg Oral Given 07/30/19 1026)     Initial Impression / Assessment and Plan / ED Course  I have reviewed the triage vital signs and the nursing notes.  Pertinent labs & imaging results that were available during my care of the patient were reviewed by me and considered in my medical decision making (see chart for details).     Final Clinical Impressions(s) / ED Diagnoses   Final diagnoses:  Atypical chest pain   Patient is to be discharged with recommendation to follow up with PCP  in regards to today's hospital visit. Chest pain is not likely of emergent cardiac or pulmonary etiology d/t presentation, PERC negative, VSS, no tracheal deviation, no JVD or new murmur, RRR, breath sounds equal bilaterally, EKG without acute abnormalities, negative ddimer, and negative CXR.  Pt has been advised to return to the ED if CP becomes exertional, associated with diaphoresis or nausea, radiates to left jaw/arm, worsens or becomes concerning in any way.  I have advised the patient to follow-up with her PCP and also to follow-up with a cardiologist that she was referred to.  Pt appears reliable for follow up and is agreeable to discharge.  All questions answered.  ED Discharge Orders         Ordered    naproxen (NAPROSYN) 375 MG tablet  2 times daily     07/30/19 [address] PA-C 07/30/19 1816    Blanchie Dessert, MD 08/01/19 1753

## 2019-07-30 NOTE — ED Notes (Signed)
ED Provider at bedside. 

## 2019-07-30 NOTE — Discharge Instructions (Signed)
You may alternate taking Tylenol and Ibuprofen as needed for pain control. You may take 400-600 mg of ibuprofen every 6 hours and [phone] mg of Tylenol every 6 hours. Do not exceed 4000 mg of Tylenol daily as this can lead to liver damage. Also, make sure to take Ibuprofen with meals as it can cause an upset stomach. Do not take other NSAIDs while taking Ibuprofen such as (Aleve, Naprosyn, Aspirin, Celebrex, etc) and do not take more than the prescribed dose as this can lead to ulcers and bleeding in your GI tract. You may use warm and cold compresses to help with your symptoms.   Please follow up with your primary doctor within the next 7-10 days for re-evaluation and further treatment of your symptoms.  You will also need to follow-up with the cardiologist that your primary care doctor has referred you to so that you can wear the Holter monitor to see your heart rhythm.  Please return to the ER sooner if you have any new or worsening symptoms.

## 2019-07-31 ENCOUNTER — Encounter: Payer: Self-pay | Admitting: Family Medicine

## 2019-07-31 ENCOUNTER — Ambulatory Visit: Payer: Self-pay | Admitting: Family Medicine

## 2019-07-31 VITALS — BP 98/60 | HR 96 | Temp 98.7°F | Ht 68.5 in | Wt 157.8 lb

## 2019-07-31 DIAGNOSIS — Z09 Encounter for follow-up examination after completed treatment for conditions other than malignant neoplasm: Secondary | ICD-10-CM

## 2019-07-31 DIAGNOSIS — E559 Vitamin D deficiency, unspecified: Secondary | ICD-10-CM

## 2019-07-31 DIAGNOSIS — F332 Major depressive disorder, recurrent severe without psychotic features: Secondary | ICD-10-CM

## 2019-07-31 DIAGNOSIS — E038 Other specified hypothyroidism: Secondary | ICD-10-CM

## 2019-07-31 DIAGNOSIS — F411 Generalized anxiety disorder: Secondary | ICD-10-CM

## 2019-07-31 DIAGNOSIS — R079 Chest pain, unspecified: Secondary | ICD-10-CM

## 2019-07-31 DIAGNOSIS — E538 Deficiency of other specified B group vitamins: Secondary | ICD-10-CM

## 2019-07-31 DIAGNOSIS — Z3042 Encounter for surveillance of injectable contraceptive: Secondary | ICD-10-CM

## 2019-07-31 DIAGNOSIS — N3010 Interstitial cystitis (chronic) without hematuria: Secondary | ICD-10-CM

## 2019-07-31 DIAGNOSIS — E063 Autoimmune thyroiditis: Secondary | ICD-10-CM

## 2019-07-31 MED ORDER — MEDROXYPROGESTERONE ACETATE 150 MG/ML IM SUSP
150.0000 mg | Freq: Once | INTRAMUSCULAR | Status: AC
  Administered 2019-07-31: 12:00:00 150 mg via INTRAMUSCULAR

## 2019-07-31 NOTE — Assessment & Plan Note (Signed)
On synthroid 75 mcg daily. Seeing endo next week for management.

## 2019-07-31 NOTE — Assessment & Plan Note (Signed)
Treated by previous PCP but due for follow up VitD and Vit B12.  Will add as future orders and ask Dr. Arman Filter office to draw them when she draws her labs next Thursday. The patient indicates understanding of these issues and agrees with the plan.

## 2019-07-31 NOTE — Assessment & Plan Note (Signed)
>  40 minutes spent in face to face time with patient, >50% spent in counselling or coordination of care discussing ED visit and chronic medical problems.   She has

## 2019-07-31 NOTE — Assessment & Plan Note (Signed)
She feels any anxiety or depression she is dealing with now is situational.

## 2019-07-31 NOTE — Progress Notes (Signed)
Subjective:   Patient ID: Victoria Carson, female    DOB: [birth year], 23 y.o.   MRN: [id number]  Victoria Carson is a pleasant 23 y.o. year old female with h/o IC, IBS, migraines, hypothyroidism, MDD, GAD, who presents to clinic today with Transitions Of Care (Pt screened at vehicle. She is here today for TOC from Despina Pole, NP to [name], MD.  She would like to discuss meds. Pt declines flu shot. Vit-D was low last time and she just started taking some OTC. would like that rechecked as well.) and Follow-up (ED. She was seen at ED due to chest pain.  She wonders if it is due in part to her Hashimoto's Thyroiditis. Last checked in June. She feels like it may need to be rechecked today.  She sees Dr. Cruzita Lederer on Thursday.)  on 07/31/2019  HPI: Originally here to transfer care from [facility] but she was seen in the ED yesterday for CP so this is an ED follow up as well.  Chart reviewed extensively.  ED follow up- presented to [facility] yesterday with CP which she said had been occurring intermittent since March. Episodes typically last seconds to minutes and occur at night.  What prompted her to go to the ED this time was the episode on the night prior lasted 2.5 hours.  She stated that symptoms started the night of 07/29/19 while she was standing at work and she felt it was worse with exertion and inspiration . She felt she could not breathe well because of the pain. She described the pain as a pressure/ tightness.  Pain was located midsternally and radiated to her back. Rated pain 8/10. Stated that her HR ranged from 80s-160 during the nighttime episode on 07/29/19.    Denied cough or fevers. Denied known COVID contacts. Denied abd pain, NVD, She reported dysuria, frequency, and suprapubic pressure, consist with her IC flares  Is on the depo shot, but missed the last one. Last month drove 8 hours to the [place].  Denies leg pain/swelling, hemoptysis, recent surgery/trauma, personal hx of  cancer, or hx of DVT/PE.   States she has seen by [facility] for this and has been referred to cardiology but has not followed up yet because she felt her symptoms were improving.  EKG- NSR, negative ddimer, and negative CXR. CBC within normal limits, Upreg and UA neg. D dimer negative. Diagnosed with atypical CP, given Naproxen and advised to follow up with PCP.  Not currently having any chest pain.  She does think she has a family h/o CAD.  Sister has WPW. Dg Chest Portable 1 View  Result Date: 07/30/2019 CLINICAL DATA:  Chest pain. EXAM: PORTABLE CHEST 1 VIEW COMPARISON:  Radiograph of November 20, 2007. FINDINGS: The heart size and mediastinal contours are within normal limits. Both lungs are clear. No pneumothorax or pleural effusion is noted. The visualized skeletal structures are unremarkable. IMPRESSION: No active disease. Electronically Signed   By: Marijo Conception M.D.   On: 07/30/2019 10:43   Hashimoto's thyroiditis- was referred to endo- has appt with Dr. Cruzita Lederer next Thursday.  Currently taking synthroid 75 mcg daily. Lab Results  Component Value Date   TSH 4.36 06/05/2019    MDD/GAD- not currently on any rxs for this. Sees Dr. Cheryln Manly- but only twice over the summer.  She feels her anxiety and depression is under much better controlled.  She feels it is mainly situational and she has coping techniques. GAD 7 : Generalized  Anxiety Score 07/31/2019 05/07/2019 10/17/2016  Nervous, Anxious, on Edge 1 3 3   Control/stop worrying 1 3 3   Worry too much - different things 2 3 3   Trouble relaxing 1 0 3  Restless 0 0 3  Easily annoyed or irritable 2 3 1   Afraid - awful might happen 2 3 3   Total GAD 7 Score 9 15 19   Anxiety Difficulty Somewhat difficult Somewhat difficult Somewhat difficult    Depression screen [facility] 2/9 07/31/2019 05/07/2019 10/17/2016 07/26/2015  Decreased Interest 1 2 3  0  Down, Depressed, Hopeless 0 1 3 0  PHQ - 2 Score 1 3 6  0  Altered sleeping 3 3 2  -  Tired,  decreased energy 3 3 3  -  Change in appetite 0 0 0 -  Feeling bad or failure about yourself  1 3 1  -  Trouble concentrating 0 3 3 -  Moving slowly or fidgety/restless 1 3 3  -  Suicidal thoughts 0 0 1 -  PHQ-9 Score 9 18 19  -  Difficult doing work/chores Somewhat difficult Very difficult - -   IC-  Was seeing urologist who gave her injections but she is no longer on her parents' insurance and therefore cannot afford it.    Has never tried and IC diet.  Review of Systems  Constitutional: Positive for fatigue.  HENT: Negative.   Eyes: Negative.   Respiratory: Negative.   Cardiovascular: Negative.   Gastrointestinal: Negative.   Endocrine: Negative.   Genitourinary: Positive for urgency. Negative for dysuria.  Musculoskeletal: Negative.   Allergic/Immunologic: Negative.   Neurological: Negative.   Psychiatric/Behavioral: Negative.   All other systems reviewed and are negative.      Objective:    BP 98/60 (BP Location: Left Arm, Patient Position: Sitting, Cuff Size: Normal)   Pulse 96   Temp 98.7 F (37.1 C) (Oral)   Ht 5' 8.5" (1.74 m)   Wt 157 lb 12.8 oz (71.6 kg)   SpO2 98%   BMI 23.64 kg/m    Physical Exam   General:  Well-developed,well-nourished,in no acute distress; alert,appropriate and cooperative throughout examination Head:  normocephalic and atraumatic.   Eyes:  vision grossly intact, PERRL Ears:  R ear normal and L ear normal externally, TMs clear bilaterally Nose:  no external deformity.   Mouth:  good dentition.   Neck: + goiter Lungs:  Normal respiratory effort, chest expands symmetrically. Lungs are clear to auscultation, no crackles or wheezes. Heart:  Normal rate and regular rhythm. S1 and S2 normal without gallop, murmur, click, rub or other extra sounds. Abdomen:  Bowel sounds positive,abdomen soft and non-tender without masses, organomegaly or hernias noted. Msk:  No deformity or scoliosis noted of thoracic or lumbar spine.   Extremities:  No  clubbing, cyanosis, edema, or deformity noted with normal full range of motion of all joints.   Neurologic:  alert & oriented X3 and gait normal.   Skin:  Intact without suspicious lesions or rashes Cervical Nodes:  No lymphadenopathy noted Axillary Nodes:  No palpable lymphadenopathy Psych:  Cognition and judgment appear intact. Alert and cooperative with normal attention span and concentration. No apparent delusions, illusions, hallucinations

## 2019-07-31 NOTE — Patient Instructions (Addendum)
It was great to meet you.  Please schedule a Nurse Visit for your next Depo-Provera to be given in your left glute on 11.4.20-11.18.20  :)    Eating Plan for Interstitial Cystitis Interstitial cystitis (IC) is a long-term (chronic) condition that causes pain and pressure in the bladder, the lower abdomen, and the pelvic area. Other symptoms of IC include urinary urgency and frequency. Symptoms tend to come and go. Many people with IC find that certain foods trigger their symptoms. Different foods may be problematic for different people. Some foods are more likely to cause symptoms than others. Learning which foods bother you and which do not can help you come up with an eating plan to manage IC. What are tips for following this plan? You may find it helpful to work with a dietitian. This health care provider can help you develop your eating plan by doing an elimination diet, which involves these steps:  Start with a list of foods that you think trigger your IC symptoms along with the foods that most commonly trigger symptoms for many people with IC.  Eliminate those foods from your diet for about one month, then start reintroducing the foods one at a time to see which ones trigger your symptoms.  Make a list of the foods that trigger your symptoms. It may take several months to find out which foods bother you. Reading food labels Once you know which foods trigger your IC symptoms, you can avoid them. However, it is also a good idea to read food labels because some foods that trigger your symptoms may be included as ingredients in other foods. These ingredients may include:  Chili peppers.  Tomato products.  Soy.  Worcestershire sauce.  Vinegar.  Alcohol.  Citrus flavors or juices.  Artificial sweeteners.  Monosodium glutamate. Shopping  Shopping can be a challenge if many foods trigger your IC. When you go grocery shopping, bring a list of the foods you can eat.  You can get  an app for your phone that lets you know which foods are the safest and which you may want to avoid. You can find the app at the Interstitial Cystitis Network website: www.ic-network.com Meal planning  Plan your meals according to the results of your elimination diet. If you have not done an elimination diet, plan meals according to IC food lists recommended by your health care provider or dietitian. These lists tell you which foods are least and most likely to cause symptoms.  Avoid certain types of food when you go out to eat, such as pizza and foods typically served at [place], [place], and [place]. These foods often contain ingredients that can aggravate IC. General information Here are some general guidelines for an IC eating plan:  Do not eat large portions.  Drink plenty of fluids with your meals.  Do not eat foods that are high in sugar, salt, or saturated fat.  Choose whole fruits instead of juice.  Eat a colorful variety of vegetables. What foods should I eat? For people with IC, the best diet is a balanced one that includes things from all the food groups. Even if you have to avoid certain foods, there are still plenty of healthy choices in each group. The following are some foods that are least bothersome and may be safest to eat: Fruits Bananas. Blueberries and blueberry juice. Melons. Pears. Apples. Dates. Prunes. Raisins. Apricots. Vegetables Asparagus. Avocado. Celery. Beets. Bell peppers. Black olives. Broccoli. Brussels sprouts. Cabbage. Carrots. Cauliflower. Cucumber.  Eggplant. Green beans. Potatoes. Radishes. Spinach. Squash. Turnips. Zucchini. Mushrooms. Peas. Grains Oats. Rice. Bran. Oatmeal. Whole wheat bread. Meats and other proteins Beef. Fish and other seafood. Eggs. Nuts. Peanut butter. Pork. Poultry. Lamb. Garbanzo beans. Pinto beans. Dairy Whole or low-fat milk. American, mozzarella, mild cheddar, feta, ricotta, and cream cheeses. The items listed  above may not be a complete list of foods and beverages you can eat. Contact a dietitian for more information. What foods should I avoid? You should avoid any foods that seem to trigger your symptoms. It is also a good idea to avoid foods that are most likely to cause symptoms in many people with IC. These include the following: Fruits Citrus fruits, including lemons, limes, oranges, and grapefruit. Cranberries. Strawberries. Pineapple. Kiwi. Vegetables Chili peppers. Onions. Sauerkraut. Tomato and tomato products. Angie Fava. Grains You do not need to avoid any type of grain unless it triggers your symptoms. Meats and other proteins Precooked or cured meats, such as sausages or meat loaves. Soy products. Dairy Chocolate ice cream. Processed cheese. Yogurt. Beverages Alcohol. Chocolate drinks. Coffee. Cranberry juice. Carbonated drinks. Tea (black, green, or herbal). Tomato juice. Sports drinks. The items listed above may not be a complete list of foods and beverages you should avoid. Contact a dietitian for more information. Summary  Many people with IC find that certain foods trigger their symptoms. Different foods may be problematic for different people. Some foods are more likely to cause symptoms than others.  You may find it helpful to work with a dietitian to do an elimination diet and come up with an eating plan that is right for you.  Plan your meals according to the results of your elimination diet. If you have not done an elimination diet, plan your meals using IC food lists. These lists tell you which foods are least and most likely to cause symptoms.  The best diet for people with IC is a balanced diet that includes foods from all the food groups. Even if you have to avoid certain foods, there are still plenty of healthy choices in each group. This information is not intended to replace advice given to you by your health care provider. Make sure you discuss any questions you have  with your health care provider. Document Released: 08/02/2018 Document Revised: 03/21/2019 Document Reviewed: 08/02/2018 Elsevier Patient Education  2020 [employer].

## 2019-07-31 NOTE — Assessment & Plan Note (Signed)
Discussed IC diet- handout given (see AVS).  Advised also keeping a symptoms journal.

## 2019-07-31 NOTE — Assessment & Plan Note (Signed)
Wants to continue Depo provera IM q months and she is due.  IM depo given to patient today along with a schedule for when she should return for her next injection.

## 2019-07-31 NOTE — Assessment & Plan Note (Signed)
Resolved- she feels strongly this is related to her hashimotos. She wants to hold off on cardiology referral  at this time(until after she has seen endo) but will update me or go to the ED if symptoms worsen again.

## 2019-08-06 ENCOUNTER — Other Ambulatory Visit: Payer: Self-pay

## 2019-08-08 ENCOUNTER — Other Ambulatory Visit: Payer: Self-pay

## 2019-08-08 ENCOUNTER — Encounter: Payer: Self-pay | Admitting: Internal Medicine

## 2019-08-08 ENCOUNTER — Ambulatory Visit: Payer: Self-pay | Admitting: Internal Medicine

## 2019-08-08 VITALS — BP 100/60 | HR 85 | Ht 68.5 in | Wt 157.0 lb

## 2019-08-08 DIAGNOSIS — E038 Other specified hypothyroidism: Secondary | ICD-10-CM

## 2019-08-08 DIAGNOSIS — R109 Unspecified abdominal pain: Secondary | ICD-10-CM

## 2019-08-08 DIAGNOSIS — E063 Autoimmune thyroiditis: Secondary | ICD-10-CM

## 2019-08-08 DIAGNOSIS — E569 Vitamin deficiency, unspecified: Secondary | ICD-10-CM

## 2019-08-08 DIAGNOSIS — R159 Full incontinence of feces: Secondary | ICD-10-CM

## 2019-08-08 DIAGNOSIS — E040 Nontoxic diffuse goiter: Secondary | ICD-10-CM

## 2019-08-08 LAB — VITAMIN D 25 HYDROXY (VIT D DEFICIENCY, FRACTURES): VITD: 40.17 ng/mL (ref 30.00–100.00)

## 2019-08-08 LAB — VITAMIN B12: Vitamin B-12: 672 pg/mL (ref 211–911)

## 2019-08-08 LAB — T4, FREE: Free T4: 1.09 ng/dL (ref 0.60–1.60)

## 2019-08-08 LAB — TSH: TSH: 4.01 u[IU]/mL (ref 0.35–4.50)

## 2019-08-08 LAB — T3, FREE: T3, Free: 2.8 pg/mL (ref 2.3–4.2)

## 2019-08-08 NOTE — Progress Notes (Signed)
Patient ID: Victoria Carson, female   DOB: 10-26-1996, 23 y.o.   MRN: [id number]    HPI  Victoria Carson is a 23 y.o.-year-old female, referred by Alma Friendly, NP for management of Hashimoto's hypothyroidism.  Pt. has been dx with goiter in 12/2018 after she noticed that her neck appeared enlarged.  A thyroid ultrasound did not show nodules but showed heterogeneous, enlarged thyroid.  She saw PCP >> was dx'ed hypothyroidism in 01/2019 >> started on Levothyroxine 25 mcg, then increased to 50 mcg in 04/2019, then increased to 75 mcg and switched to Synthroid DAW in 05/2019. She feels much better on this.  She takes the thyroid hormone: - fasting - with water - separated by >30 min from b'fast  - no calcium, iron, PPIs, multivitamins   I reviewed pt's thyroid tests: Lab Results  Component Value Date   TSH 4.36 06/05/2019   TSH 5.625 (H) 06/01/2019   TSH 4.05 04/24/2019   TSH 3.64 03/12/2019   TSH 7.42 (H) 01/24/2019   TSH 4.42 07/06/2018   TSH 3.991 01/02/2013   FREET4 0.79 01/24/2019   Antithyroid antibodies: Component     Latest Ref Rng & Units 01/24/2019  TSI     <140 % baseline <89  Thyroperoxidase Ab SerPl-aCnc     <9 IU/mL >900 (H)   Thyroid U/S (0214/2020): Markedly enlarged and heterogeneous thyroid gland without evidence of discrete thyroid nodule.  Pt describes: - + weight gain since approx 12/2018: 27 lbs - + fatigue - + cold intolerance - chronic - + depression - + constipation - since 10/2018 - + dry skin, changed from oily skin in 2019 - no hair loss, but thinning - + irregular menses since menarche >> started Depo @ 22 y/o. Also possible endometriosis. - She also describes episodes of tachycardia associated with chest pain.  Her heart rate can increase from 65 to 130 bpm according to her Apple Watch.  She has been to the emergency room once for this but her heart rate decreased before she got there.  She did not have this investigated yet.  Pt denies  feeling nodules in neck, hoarseness, dysphagia/odynophagia, SOB with lying down.  No FH of thyroid cancer. + FH of Hashimoto's thyroiditis 2nd cousin. No h/o radiation tx to head or neck. No recent use of iodine supplements.  Pt. also has a history of chronic bladder pain - has interstitial cystitis - sees urology.  ROS: Constitutional: + All: Fatigue, weight gain, cold intolerance, hot flashes Eyes: no blurry vision, no xerophthalmia ENT: no sore throat, no nodules palpated in throat, no dysphagia/odynophagia, no hoarseness, + tinnitus Cardiovascular: + CP/no SOB/palpitations/leg swelling Respiratory: no cough/SOB Gastrointestinal: + N/no V/+ diarrhea and bowel incontinence occasionally, + AP, + acid reflux Musculoskeletal: + Both muscle/joint aches Skin: no rashes, + itching, + hair loss Neurological: no tremors/numbness/tingling/dizziness, + headaches Psychiatric: + both: depression/anxiety + Low libido  Past Medical History:  Diagnosis Date  . Chronic cholecystitis 07/2016  . Dental crown present   . Difficulty swallowing pills   . Dust allergy   . GERD (gastroesophageal reflux disease)    no current med.  . Hypothyroidism (acquired)   . IBS (irritable bowel syndrome)   . Migraines    Past Surgical History:  Procedure Laterality Date  . CHOLECYSTECTOMY N/A 08/08/2016   Procedure: LAPAROSCOPIC CHOLECYSTECTOMY;  Surgeon: Coralie Keens, MD;  Location: [facility];  Service: General;  Laterality: N/A;  . WISDOM TOOTH EXTRACTION  Social History   Socioeconomic History  . Marital status: Single    Spouse name: n/a  . Number of children: 0  . Years of education: Not on file  . Highest education level: Not on file  Occupational History  . 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 Financial resource strain: Not on file  . Food insecurity    Worry: Not on file    Inability: Not on file  . Transportation needs    Medical: Not on  file    Non-medical: Not on file  Tobacco Use  . Smoking status: Never Smoker  . Smokeless tobacco: Never Used  Substance and Sexual Activity  . Alcohol use: Yes    Comment: occassional: Weekends-2/3  . Drug use: No  . Sexual activity: Never    Partners: Male    Birth control/protection: Inserts  Lifestyle  . Physical activity    Days per week: Not on file    Minutes per session: Not on file  . Stress: Not on file  Relationships  . Social Herbalist on phone: Not on file    Gets together: Not on file    Attends religious service: Not on file    Active member of club or organization: Not on file    Attends meetings of clubs or organizations: Not on file    Relationship status: Not on file  . Intimate partner violence    Fear of current or ex partner: Not on file    Emotionally abused: Not on file    Physically abused: Not on file    Forced sexual activity: Not on file  Other Topics Concern  . Not on file  Social History Narrative   05/01/2013 AHW Gearlean was poor in [place], [place].. She currently lives in [place] with her father, grandfather, older sister, and younger brother. She currently is homeschooled and in the 10th grade. She has a boyfriend of 2 months. 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 2 years ago, she was caught shoplifting. She reports that her social support system consists of a friend. 05/01/2013 AHW      Single. Lives with father.    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   Enjoys spending time with her friends.               Current Outpatient Medications on File Prior to Visit  Medication Sig Dispense Refill  . Cyanocobalamin (VITAMIN B 12 PO) Take 1 tablet by mouth daily.    . medroxyPROGESTERone (DEPO-PROVERA) 150 MG/ML injection Inject 150 mg into the muscle every 3 (three) months.    . SYNTHROID 75 MCG tablet TAKE 1 TABLET BY MOUTH EVERY MORNING ON AN EMPTY STOMACH. NO FOOD OR OTHER  MEDICATIONS FOR 30 MINS (Patient taking differently: Take 75 mcg by mouth daily before breakfast. TAKE ON AN EMPTY STOMACH. NO FOOD OR OTHER MEDICATIONS FOR 30 MINS) 30 tablet 0  . VITAMIN D PO Take 1,000 Units by mouth daily.     No current facility-administered medications on file prior to visit.    Allergies  Allergen Reactions  . Citrus Itching and Other (See Comments)    SWEATING  . Sulfa Antibiotics Nausea Only, Other (See Comments) and Nausea And Vomiting    FEVER   Family History  Problem Relation Age of Onset  . Mental illness Mother   . Drug abuse Mother   . Heart  failure Other   . Hypertension Other   . Depression Paternal Aunt   . Anxiety disorder Paternal Aunt   . Stroke Paternal Grandfather     PE: BP 100/60   Pulse 85   Ht 5' 8.5" (1.74 m)   Wt 157 lb (71.2 kg)   SpO2 98%   BMI 23.52 kg/m  Wt Readings from Last 3 Encounters:  08/08/19 157 lb (71.2 kg)  07/31/19 157 lb 12.8 oz (71.6 kg)  06/05/19 147 lb 8 oz (66.9 kg)   Constitutional: normal weight, in NAD Eyes: PERRLA, EOMI, no exophthalmos ENT: moist mucous membranes, + significant thyromegaly: R>L, no cervical lymphadenopathy Cardiovascular: RRR, No MRG Respiratory: CTA B Gastrointestinal: abdomen soft, NT, ND, BS+ Musculoskeletal: no deformities, strength intact in all 4 Skin: moist, warm, no rashes Neurological: no tremor with outstretched hands, DTR normal in all 4  ASSESSMENT: 1. Hypothyroidism   2.  Hashimoto's thyroiditis  3.  Goiter  4.  Abdominal pain  5.  Tachycardia  PLAN:  1. Patient with long-standing hypothyroidism, on levothyroxine therapy - now on Synthroid d.a.w. 75 mcg daily.  She feels much better after she changed to the brand name Synthroid and increase the dose. - she appears euthyroid but still has multiple complaints (see HPI).  - We discussed about correct intake of levothyroxine, fasting, with water, separated by at least 30 minutes from breakfast, and separated by  more than 4 hours from calcium, iron, multivitamins, acid reflux medications (PPIs).  She is taking it correctly. - will check thyroid tests today: TSH, free T4 - If labs today are abnormal, she will need to return in ~6 weeks for repeat labs - Otherwise, I will see her back in 4 months  2.  Hashimoto's thyroiditis -We reviewed together her TPO antibody level.  This was undetectably high.  -I explained that Hashimoto's thyroiditis is an autoimmune disease and the most common cause of hypothyroidism in [place]. It can cause a degree of fatigue despite normal thyroid tests.  -We discussed that selenium can help decrease her antibodies and I recommended 200 mcg daily.  This is over-the-counter. -We also discussed about other ways to improve her immune system: Increased rest, decrease stress, good nutrition, exercise  3.  Goiter -She has an enlarged thyroid which does not contain nodules per recent thyroid ultrasound, which was reviewed today with the patient. -Treating her hypothyroidism may help shrinking of her thyroid -No neck compression symptoms -We will continue to follow this clinically  4.  Abdominal pain -Associated with stool incontinence -We will check a celiac panel  5.  Tachycardia -Heart rate normal at today's check -It is associated with chest pain -Heart rate increased from 65 to approximately 130 at home at rest -I strongly recommended to see cardiology for evaluation-  Her PCPs request, since she has a history of low vitamin levels, will also add a vitamin D and B12 level to today's labs.  Needs refills of Synthroid.  Component     Latest Ref Rng & Units 08/08/2019  Immunoglobulin A     47 - 310 mg/dL 135  (tTG) Ab, IgG     U/mL 3  (tTG) Ab, IgA     U/mL 1  Gliadin IgA     Units 3  Deamidated Gliadin Abs, IgG     Units 2  TSH     0.35 - 4.50 uIU/mL 4.01  T4,Free(Direct)     0.60 - 1.60 ng/dL 1.09  Vitamin B12  211 - 911 pg/mL 672  VITD     30.00 - 100.00  ng/mL 40.17  Triiodothyronine,Free,Serum     2.3 - 4.2 pg/mL 2.8   TSH is in the upper limit of normal.  We can try to increase the dose of Synthroid to 88 mcg daily.  We will then repeat the thyroid tests in 1.5 months. All other labs are normal.  No signs of celiac disease.  Philemon Kingdom, MD PhD [facility] Endocrinology

## 2019-08-08 NOTE — Patient Instructions (Addendum)
Please stop at the lab.  Please continue Synthroid 75 mcg daily.  Take the thyroid hormone every day, with water, at least 30 minutes before breakfast, separated by at least 4 hours from: - acid reflux medications - calcium - iron - multivitamins  Please start Selenium 200 mcg daily.  Please return in 4 months.   Hypothyroidism  Hypothyroidism is when the thyroid gland does not make enough of certain hormones (it is underactive). The thyroid gland is a small gland located in the lower front part of the neck, just in front of the windpipe (trachea). This gland makes hormones that help control how the body uses food for energy (metabolism) as well as how the heart and brain function. These hormones also play a role in keeping your bones strong. When the thyroid is underactive, it produces too little of the hormones thyroxine (T4) and triiodothyronine (T3). What are the causes? This condition may be caused by:  Hashimoto's disease. This is a disease in which the body's disease-fighting system (immune system) attacks the thyroid gland. This is the most common cause.  Viral infections.  Pregnancy.  Certain medicines.  Birth defects.  Past radiation treatments to the head or neck for cancer.  Past treatment with radioactive iodine.  Past exposure to radiation in the environment.  Past surgical removal of part or all of the thyroid.  Problems with a gland in the center of the brain (pituitary gland).  Lack of enough iodine in the diet. What increases the risk? You are more likely to develop this condition if:  You are female.  You have a family history of thyroid conditions.  You use a medicine called lithium.  You take medicines that affect the immune system (immunosuppressants). What are the signs or symptoms? Symptoms of this condition include:  Feeling as though you have no energy (lethargy).  Not being able to tolerate cold.  Weight gain that is not explained  by a change in diet or exercise habits.  Lack of appetite.  Dry skin.  Coarse hair.  Menstrual irregularity.  Slowing of thought processes.  Constipation.  Sadness or depression. How is this diagnosed? This condition may be diagnosed based on:  Your symptoms, your medical history, and a physical exam.  Blood tests. You may also have imaging tests, such as an ultrasound or MRI. How is this treated? This condition is treated with medicine that replaces the thyroid hormones that your body does not make. After you begin treatment, it may take several weeks for symptoms to go away. Follow these instructions at home:  Take over-the-counter and prescription medicines only as told by your health care provider.  If you start taking any new medicines, tell your health care provider.  Keep all follow-up visits as told by your health care provider. This is important. ? As your condition improves, your dosage of thyroid hormone medicine may change. ? You will need to have blood tests regularly so that your health care provider can monitor your condition. Contact a health care provider if:  Your symptoms do not get better with treatment.  You are taking thyroid replacement medicine and you: ? Sweat a lot. ? Have tremors. ? Feel anxious. ? Lose weight rapidly. ? Cannot tolerate heat. ? Have emotional swings. ? Have diarrhea. ? Feel weak. Get help right away if you have:  Chest pain.  An irregular heartbeat.  A rapid heartbeat.  Difficulty breathing. Summary  Hypothyroidism is when the thyroid gland does not make enough  of certain hormones (it is underactive).  When the thyroid is underactive, it produces too little of the hormones thyroxine (T4) and triiodothyronine (T3).  The most common cause is Hashimoto's disease, a disease in which the body's disease-fighting system (immune system) attacks the thyroid gland. The condition can also be caused by viral infections,  medicine, pregnancy, or past radiation treatment to the head or neck.  Symptoms may include weight gain, dry skin, constipation, feeling as though you do not have energy, and not being able to tolerate cold.  This condition is treated with medicine to replace the thyroid hormones that your body does not make. This information is not intended to replace advice given to you by your health care provider. Make sure you discuss any questions you have with your health care provider. Document Released: 11/28/2005 Document Revised: 11/10/2017 Document Reviewed: 11/08/2017 Elsevier Patient Education  2020 [employer].

## 2019-08-12 LAB — CELIAC DISEASE PANEL
(tTG) Ab, IgA: 1 U/mL
(tTG) Ab, IgG: 3 U/mL
Gliadin IgA: 3 Units
Gliadin IgG: 2 Units
Immunoglobulin A: 135 mg/dL (ref 47–310)

## 2019-08-12 MED ORDER — SYNTHROID 88 MCG PO TABS: 88.0000 ug | ORAL_TABLET | Freq: Every day | ORAL | 3 refills | Status: DC

## 2019-08-14 ENCOUNTER — Other Ambulatory Visit: Payer: Self-pay | Admitting: Family Medicine

## 2019-08-14 DIAGNOSIS — R002 Palpitations: Secondary | ICD-10-CM

## 2019-08-14 DIAGNOSIS — R079 Chest pain, unspecified: Secondary | ICD-10-CM

## 2019-08-19 ENCOUNTER — Encounter: Payer: Self-pay | Admitting: Family Medicine

## 2019-08-21 NOTE — Progress Notes (Signed)
Virtual Visit via Video   Due to the COVID-19 pandemic, this visit was completed with telemedicine (audio/video) technology to reduce patient and provider exposure as well as to preserve personal protective equipment.   I connected with Victoria Carson by a video enabled telemedicine application and verified that I am speaking with the correct person using two identifiers. Location patient: Home Location provider: [facility], Office Persons participating in the virtual visit: Victoria Carson, Victoria Norris, MD   I discussed the limitations of evaluation and management by telemedicine and the availability of in person appointments. The patient expressed understanding and agreed to proceed.  Care Team   Patient Care Team: Victoria Passy, MD as PCP - General (Family Medicine) Victoria Gavia Hilton Cork, MD as PCP - Cardiology (Cardiology)  Subjective:   HPI:   Pt sent the following message two nights ago.  "My stomach issues have gotten worse, and I was wondering if you could do blood work and see if you can find anything. I'm struggling more with bowel incontinence now than I did before, and my stomach will cramp up really bad. It doesn't depend if I've ate for the day and I'm struggling to figure out what foods could be causing it from that. I'll wake up and about 30 minutes after I wake up I feel an urgency to go, and it'll last all day and I'll end up using the bathroom between 3-7 times a day. I've been struggling to stay hydrated from how much I've been having to use the bathroom. I've had incidents where I'll have to go while in a car, and have to pull over and go in the woods. I'm having pretty much only diarrhea and it'll be a bright yellow color, and foul smelling or a hard stool and it's a pale color. I can set up an appointment to meet with you, I'm just really going through it with my stomach problems now."  Has been having intermittent episodes for years.  EGD in 2017.  No  colonoscopy. She is unsure if anyone has IBD in her family. She does have blood and mucous in her stool.  Family h/o autoimmune disease- dad has psoriasis.  She herself has hashimotos.  No nausea or vomiting.  Only abdominal pain is the cramping before and during BMs. They do wake them from sleep.  Does get canker sore.  Review of Systems  Cardiovascular: Positive for palpitations.  Gastrointestinal: Positive for abdominal pain, blood in stool and diarrhea. Negative for heartburn and nausea.      Patient Active Problem List   Diagnosis Date Noted  . Chest pain 07/31/2019  . Encounter for examination following treatment at hospital 07/31/2019  . Vitamin D deficiency 07/31/2019  . Lower extremity weakness 06/05/2019  . Palpitations 03/12/2019  . Hypothyroidism due to Hashimoto's thyroiditis 01/24/2019  . Fatigue 07/06/2018  . Acne vulgaris 10/25/2017  . Interstitial cystitis 10/17/2016  . Encounter for birth control 10/17/2016  . MDD (major depressive disorder), recurrent episode, severe (Victoria Carson) 02/15/2013  . GAD (generalized anxiety disorder) 02/15/2013    Social History   Tobacco Use  . Smoking status: Never Smoker  . Smokeless tobacco: Never Used  Substance Use Topics  . Alcohol use: Yes    Comment: occassional    Current Outpatient Medications:  .  Cyanocobalamin (VITAMIN B 12 PO), Take 1 tablet by mouth daily., Disp: , Rfl:  .  medroxyPROGESTERone (DEPO-PROVERA) 150 MG/ML injection, Inject 150 mg into the muscle every 3 (  three) months., Disp: , Rfl:  .  Selenium 200 MCG CAPS, Take 1 capsule by mouth daily., Disp: , Rfl:  .  SYNTHROID 88 MCG tablet, Take 1 tablet (88 mcg total) by mouth daily before breakfast., Disp: 45 tablet, Rfl: 3 .  VITAMIN D PO, Take 1,000 Units by mouth daily., Disp: , Rfl:   Allergies  Allergen Reactions  . Citrus Itching and Other (See Comments)    SWEATING  . Sulfa Antibiotics Nausea Only, Other (See Comments) and Nausea And Vomiting     FEVER    Objective:  There were no vitals taken for this visit.  VITALS: Per patient if applicable, see vitals. GENERAL: Alert, appears well and in no acute distress. HEENT: Atraumatic, conjunctiva clear, no obvious abnormalities on inspection of external nose and ears. NECK: Normal movements of the head and neck. CARDIOPULMONARY: No increased WOB. Speaking in clear sentences. I:E ratio WNL.  MS: Moves all visible extremities without noticeable abnormality. PSYCH: Pleasant and cooperative, well-groomed. Speech normal rate and rhythm. Affect is appropriate. Insight and judgement are appropriate. Attention is focused, linear, and appropriate.  NEURO: CN grossly intact. Oriented as arrived to appointment on time with no prompting. Moves both UE equally.  SKIN: No obvious lesions, wounds, erythema, or cyanosis noted on face or hands.  Depression screen [facility] 2/9 07/31/2019 05/07/2019 10/17/2016  Decreased Interest 1 2 3   Down, Depressed, Hopeless 0 1 3  PHQ - 2 Score 1 3 6   Altered sleeping 3 3 2   Tired, decreased energy 3 3 3   Change in appetite 0 0 0  Feeling bad or failure about yourself  1 3 1   Trouble concentrating 0 3 3  Moving slowly or fidgety/restless 1 3 3   Suicidal thoughts 0 0 1  PHQ-9 Score 9 18 19   Difficult doing work/chores Somewhat difficult Very difficult -    Assessment and Plan:   [name] was seen today for follow-up.  Diagnoses and all orders for this visit:  Bloody stools -     Ambulatory referral to Gastroenterology  Encounter for diagnostic colonoscopy due to change in bowel habits -     Ambulatory referral to Gastroenterology    . COVID-19 Education: The signs and symptoms of COVID-19 were discussed with the patient and how to seek care for testing if needed. The importance of social distancing was discussed today. . Reviewed expectations re: course of current medical issues. . Discussed self-management of symptoms. . Outlined signs and symptoms  indicating need for more acute intervention. . Patient verbalized understanding and all questions were answered. Marland Kitchen Health Maintenance issues including appropriate healthy diet, exercise, and smoking avoidance were discussed with patient. . See orders for this visit as documented in the electronic medical record.  Victoria Norris, MD  Records requested if needed. Time spent: 25 minutes, of which >50% was spent in obtaining information about her symptoms, reviewing her previous labs, evaluations, and treatments, counseling her about her condition (please see the discussed topics above), and developing a plan to further investigate it; she had a number of questions which I addressed.

## 2019-08-22 ENCOUNTER — Encounter: Payer: Self-pay | Admitting: Gastroenterology

## 2019-08-22 ENCOUNTER — Ambulatory Visit (INDEPENDENT_AMBULATORY_CARE_PROVIDER_SITE_OTHER): Payer: Self-pay | Admitting: Family Medicine

## 2019-08-22 ENCOUNTER — Encounter: Payer: Self-pay | Admitting: Family Medicine

## 2019-08-22 DIAGNOSIS — K921 Melena: Secondary | ICD-10-CM

## 2019-08-22 DIAGNOSIS — R194 Change in bowel habit: Secondary | ICD-10-CM

## 2019-08-22 NOTE — Assessment & Plan Note (Signed)
>  25 minutes spent in face to face time with patient, >50% spent in counselling or coordination of care discussing her symptoms.  After reviewing her chart and her symptoms, I am quite concerned that she has undiagnosed/untreated IBD.  Will refer to GI for stat colonoscopy.  Also advised to drink fluids with electrolytes. Call or send my chart message prn if these symptoms worsen or fail to improve as anticipated. The patient indicates understanding of these issues and agrees with the plan.

## 2019-08-24 ENCOUNTER — Encounter: Payer: Self-pay | Admitting: Internal Medicine

## 2019-08-26 ENCOUNTER — Encounter: Payer: Self-pay | Admitting: Family Medicine

## 2019-08-26 NOTE — Telephone Encounter (Signed)
Please advise 

## 2019-08-27 ENCOUNTER — Other Ambulatory Visit: Payer: Self-pay

## 2019-08-27 ENCOUNTER — Ambulatory Visit: Payer: Self-pay | Admitting: Physician Assistant

## 2019-08-27 ENCOUNTER — Encounter: Payer: Self-pay | Admitting: Physician Assistant

## 2019-08-27 VITALS — BP 112/64 | HR 73 | Temp 97.1°F | Ht 69.0 in | Wt 157.8 lb

## 2019-08-27 DIAGNOSIS — R197 Diarrhea, unspecified: Secondary | ICD-10-CM

## 2019-08-27 DIAGNOSIS — K921 Melena: Secondary | ICD-10-CM

## 2019-08-27 DIAGNOSIS — R109 Unspecified abdominal pain: Secondary | ICD-10-CM

## 2019-08-27 MED ORDER — SUPREP BOWEL PREP KIT 17.5-3.13-1.6 GM/177ML PO SOLN: 1.0000 | ORAL | 0 refills | Status: DC

## 2019-08-27 NOTE — Progress Notes (Signed)
Agree with assessment and plan as outlined.  

## 2019-08-27 NOTE — Progress Notes (Signed)
Chief Complaint: Bloody stools, abdominal pain  HPI:    [name] is a 23 year old female with a past medical history as listed below, who was referred to me by Victoria Passy, MD for a complaint of bloody stools, abdominal pain.      10/25/2018 CT abdomen pelvis with contrast with no acute abnormality, cholecystectomy, no acute bowel obstruction or inflammation.  Normal appendix.    07/30/2019 CBC and CMP normal.  08/08/2019 celiac disease panel was normal.    08/22/2019 telemedicine visit with PCP.  At that time described that "stomach issues had gotten worse".  She is struggling with bowel incontinence and stomach cramping.  Also described blood and mucus in her stool.  EGD noted in 2017.  No previous colonoscopy.  Patient has Hashimoto's disease.  Dad has psoriasis.  No family history of IBD.    Today, the patient presents clinic and explains that since the end of last year she has had worsening lower abdominal pain "below my umbilicus" and episodes of diarrhea which are so severe that she will have bowel incontinence.  Abdominal pain described as severe cramping rated as a 9-10/10 at its worst.  Describes having to pull over on the side of the road and "not being able to make it out of the car" due to urgency of stool.  She has been having flares of the symptoms over the past 9 months, sometimes flares can last up to a month long but then she will typically have some days where she does not have symptoms at all.  Describes stool as constant during these episodes and liquid, she can go 7-10 times a day when having "a bad time".  Describes stools as bright yellow or pale but also with some bright red blood and a lot of mucus.  Describes sometimes just passing mucus and no stool at times.  Associated symptoms include nausea.  Also reports many canker sores during these flares.  Describes being treated for "IBS" in the past with Dicyclomine and various other medicines but nothing ever helped.    Denies  fever, chills, weight loss, vomiting, heartburn, reflux or symptoms that awaken her from sleep.  Past Medical History:  Diagnosis Date  . Chronic cholecystitis 07/2016  . Dental crown present   . Difficulty swallowing pills   . Dust allergy   . GERD (gastroesophageal reflux disease)    no current med.  . Hypothyroidism (acquired)   . IBS (irritable bowel syndrome)   . Migraines     Past Surgical History:  Procedure Laterality Date  . CHOLECYSTECTOMY N/A 08/08/2016   Procedure: LAPAROSCOPIC CHOLECYSTECTOMY;  Surgeon: Coralie Keens, MD;  Location: [facility];  Service: General;  Laterality: N/A;  . WISDOM TOOTH EXTRACTION      Current Outpatient Medications  Medication Sig Dispense Refill  . Cyanocobalamin (VITAMIN B 12 PO) Take 1 tablet by mouth daily.    . medroxyPROGESTERone (DEPO-PROVERA) 150 MG/ML injection Inject 150 mg into the muscle every 3 (three) months.    . Selenium 200 MCG CAPS Take 1 capsule by mouth daily.    Marland Kitchen SYNTHROID 88 MCG tablet Take 1 tablet (88 mcg total) by mouth daily before breakfast. 45 tablet 3  . VITAMIN D PO Take 1,000 Units by mouth daily.     No current facility-administered medications for this visit.     Allergies as of 08/27/2019 - Review Complete 08/22/2019  Allergen Reaction Noted  . Citrus Itching and Other (See Comments) 02/15/2013  .  Sulfa antibiotics Nausea Only, Other (See Comments), and Nausea And Vomiting 09/04/2013    Family History  Problem Relation Age of Onset  . Mental illness Mother   . Drug abuse Mother   . Heart failure Other   . Hypertension Other   . Depression Paternal Aunt   . Anxiety disorder Paternal Aunt   . Stroke Paternal Grandfather     Social History   Socioeconomic History  . Marital status: Single    Spouse name: n/a  . Number of children: 0  . Years of education: Not on file  . Highest education level: Not on file  Occupational History  . 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 Financial resource strain: Not on file  . Food insecurity    Worry: Not on file    Inability: Not on file  . Transportation needs    Medical: Not on file    Non-medical: Not on file  Tobacco Use  . Smoking status: Never Smoker  . Smokeless tobacco: Never Used  Substance and Sexual Activity  . Alcohol use: Yes    Comment: occassional  . Drug use: No  . Sexual activity: Never    Partners: Male    Birth control/protection: Inserts  Lifestyle  . Physical activity    Days per week: Not on file    Minutes per session: Not on file  . Stress: Not on file  Relationships  . Social Herbalist on phone: Not on file    Gets together: Not on file    Attends religious service: Not on file    Active member of club or organization: Not on file    Attends meetings of clubs or organizations: Not on file    Relationship status: Not on file  . Intimate partner violence    Fear of current or ex partner: Not on file    Emotionally abused: Not on file    Physically abused: Not on file    Forced sexual activity: Not on file  Other Topics Concern  . Not on file  Social History Narrative   05/01/2013 AHW Analisse was poor in [place], [place].. She currently lives in [place] with her father, grandfather, older sister, and younger brother. She currently is homeschooled and in the 10th grade. She has a boyfriend of 2 months. 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 2 years ago, she was caught shoplifting. She reports that her social support system consists of a friend. 05/01/2013 AHW      Single. Lives with father.    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   Enjoys spending time with her friends.                Review of Systems:    Constitutional: No weight loss, fever or chills Skin: No rash Cardiovascular: No chest pain  Respiratory: No SOB Gastrointestinal: See HPI and otherwise negative  Genitourinary: No dysuria  Neurological: No headache, dizziness or syncope Musculoskeletal: No new muscle or joint pain Hematologic: No bruising Psychiatric: No history of depression or anxiety   Physical Exam:  Vital signs: BP 112/64   Pulse 73   Temp (!) 97.1 F (36.2 C)   Ht 5\' 9"  (1.753 m)   Wt 157 lb 12.8 oz (71.6 kg)   BMI 23.30 kg/m   Constitutional:   Pleasant Caucasian female appears to be in NAD, Well developed, Well  nourished, alert and cooperative Head:  Normocephalic and atraumatic. Eyes:   PEERL, EOMI. No icterus. Conjunctiva pink. Ears:  Normal auditory acuity. Neck:  Supple Throat: Oral cavity and pharynx without inflammation, swelling or lesion.  Respiratory: Respirations even and unlabored. Lungs clear to auscultation bilaterally.   No wheezes, crackles, or rhonchi.  Cardiovascular: Normal S1, S2. No MRG. Regular rate and rhythm. No peripheral edema, cyanosis or pallor.  Gastrointestinal:  Soft, nondistended, Moderate lower abdominal ttp. No rebound or guarding. Normal bowel sounds. No appreciable masses or hepatomegaly. Rectal:  Not performed.  Msk:  Symmetrical without gross deformities. Without edema, no deformity or joint abnormality.  Neurologic:  Alert and  oriented x4;  grossly normal neurologically.  Skin:   Dry and intact without significant lesions or rashes. Psychiatric:  Demonstrates good judgement and reason without abnormal affect or behaviors.  RELEVANT LABS AND IMAGING: CBC    Component Value Date/Time   WBC 4.2 07/30/2019 1045   RBC 4.28 07/30/2019 1045   HGB 13.3 07/30/2019 1045   HCT 40.2 07/30/2019 1045   PLT 216 07/30/2019 1045   MCV 93.9 07/30/2019 1045   MCV 85.4 09/10/2016 1635   MCH 31.1 07/30/2019 1045   MCHC 33.1 07/30/2019 1045   RDW 12.3 07/30/2019 1045   LYMPHSABS 1.5 07/30/2019 1045   MONOABS 0.5 07/30/2019 1045   EOSABS 0.1 07/30/2019 1045   BASOSABS 0.0 07/30/2019 1045    CMP     Component Value Date/Time   NA  139 07/30/2019 1045   K 3.8 07/30/2019 1045   CL 108 07/30/2019 1045   CO2 23 07/30/2019 1045   GLUCOSE 93 07/30/2019 1045   BUN 12 07/30/2019 1045   CREATININE 0.65 07/30/2019 1045   CREATININE 0.51 01/02/2013 2124   CALCIUM 9.0 07/30/2019 1045   PROT 6.9 07/30/2019 1045   ALBUMIN 3.9 07/30/2019 1045   AST 20 07/30/2019 1045   ALT 19 07/30/2019 1045   ALKPHOS 74 07/30/2019 1045   BILITOT 0.8 07/30/2019 1045   GFRNONAA >60 07/30/2019 1045   GFRAA >60 07/30/2019 1045    Assessment: 1.  Diarrhea: Sometimes yellow, 7-10 times a day, interestingly also with canker sores during flares and some blood; consider IBD 2.  Abdominal pain: Flares of pain over the past 10 months, with other symptoms listed; concern for IBD 3.  Hematochezia: Off-and-on over the past 10 months, also with abdominal pain and diarrhea, comes and flares; concern for IBD  Plan: 1.  Scheduled patient for colonoscopy in the [place] with Dr. Havery Moros.  Did discuss risks, benefits, limitations and alternatives and the patient agrees to proceed. 2.  Discussed various medications with the patient but apparently she has tried most of them and they were unhelpful.  Will wait until findings of colonoscopy for further recommendations. 3.  Patient to follow in clinic per recommendations from Dr. Havery Moros after time of procedure.  Ellouise Newer, PA-C [facility] 08/27/2019, 10:17 AM  Cc: Victoria Passy, MD

## 2019-08-27 NOTE — Patient Instructions (Signed)
If you are age 23 or younger, your body mass index should be between 19-25. Your Body mass index is 23.3 kg/m. If this is out of the aformentioned range listed, please consider follow up with your Primary Care Provider.   You have been scheduled for a colonoscopy. Please follow written instructions given to you at your visit today.  Please pick up your prep supplies at the pharmacy within the next 1-3 days. If you use inhalers (even only as needed), please bring them with you on the day of your procedure.  We have sent the following medications to your pharmacy for you to pick up at your convenience: Suprep   Thank you for choosing me and [facility].  Dennison Bulla

## 2019-08-28 ENCOUNTER — Other Ambulatory Visit: Payer: Self-pay | Admitting: Primary Care

## 2019-08-28 DIAGNOSIS — E039 Hypothyroidism, unspecified: Secondary | ICD-10-CM

## 2019-09-03 ENCOUNTER — Telehealth: Payer: Self-pay

## 2019-09-03 NOTE — Telephone Encounter (Signed)
Covid-19 screening questions   Do you now or have you had a fever in the last 14 days? NO   Do you have any respiratory symptoms of shortness of breath or cough now or in the last 14 days? NO  Do you have any family members or close contacts with diagnosed or suspected Covid-19 in the past 14 days? NO  Have you been tested for Covid-19 and found to be positive? NO        

## 2019-09-04 ENCOUNTER — Ambulatory Visit (AMBULATORY_SURGERY_CENTER): Payer: Self-pay | Admitting: Gastroenterology

## 2019-09-04 ENCOUNTER — Other Ambulatory Visit: Payer: Self-pay | Admitting: Gastroenterology

## 2019-09-04 ENCOUNTER — Other Ambulatory Visit: Payer: Self-pay

## 2019-09-04 ENCOUNTER — Encounter: Payer: Self-pay | Admitting: Gastroenterology

## 2019-09-04 VITALS — BP 130/66 | HR 85 | Temp 98.5°F | Resp 20 | Ht 69.0 in | Wt 157.0 lb

## 2019-09-04 DIAGNOSIS — D128 Benign neoplasm of rectum: Secondary | ICD-10-CM

## 2019-09-04 DIAGNOSIS — K648 Other hemorrhoids: Secondary | ICD-10-CM

## 2019-09-04 DIAGNOSIS — D127 Benign neoplasm of rectosigmoid junction: Secondary | ICD-10-CM

## 2019-09-04 DIAGNOSIS — K5730 Diverticulosis of large intestine without perforation or abscess without bleeding: Secondary | ICD-10-CM

## 2019-09-04 DIAGNOSIS — D122 Benign neoplasm of ascending colon: Secondary | ICD-10-CM

## 2019-09-04 DIAGNOSIS — K529 Noninfective gastroenteritis and colitis, unspecified: Secondary | ICD-10-CM

## 2019-09-04 DIAGNOSIS — K625 Hemorrhage of anus and rectum: Secondary | ICD-10-CM

## 2019-09-04 MED ORDER — SODIUM CHLORIDE 0.9 % IV SOLN: 500.0000 mL | Freq: Once | INTRAVENOUS | Status: DC

## 2019-09-04 NOTE — Progress Notes (Signed)
Called to room to assist during endoscopic procedure.  Patient ID and intended procedure confirmed with present staff. Received instructions for my participation in the procedure from the performing physician.  

## 2019-09-04 NOTE — Patient Instructions (Signed)
YOU HAD AN ENDOSCOPIC PROCEDURE TODAY AT THE [facility]:   Refer to the procedure report that was given to you for any specific questions about what was found during the examination.  If the procedure report does not answer your questions, please call your gastroenterologist to clarify.  If you requested that your care partner not be given the details of your procedure findings, then the procedure report has been included in a sealed envelope for you to review at your convenience later.  YOU SHOULD EXPECT: Some feelings of bloating in the abdomen. Passage of more gas than usual.  Walking can help get rid of the air that was put into your GI tract during the procedure and reduce the bloating. If you had a lower endoscopy (such as a colonoscopy or flexible sigmoidoscopy) you may notice spotting of blood in your stool or on the toilet paper. If you underwent a bowel prep for your procedure, you may not have a normal bowel movement for a few days.  Please Note:  You might notice some irritation and congestion in your nose or some drainage.  This is from the oxygen used during your procedure.  There is no need for concern and it should clear up in a day or so.  SYMPTOMS TO REPORT IMMEDIATELY:   Following lower endoscopy (colonoscopy or flexible sigmoidoscopy):  Excessive amounts of blood in the stool  Significant tenderness or worsening of abdominal pains  Swelling of the abdomen that is new, acute  Fever of 100F or higher  For urgent or emergent issues, a gastroenterologist can be reached at any hour by calling [phone].   DIET:  We do recommend a small meal at first, but then you may proceed to your regular diet.  Drink plenty of fluids but you should avoid alcoholic beverages for 24 hours.  ACTIVITY:  You should plan to take it easy for the rest of today and you should NOT DRIVE or use heavy machinery until tomorrow (because of the sedation medicines used during the test).     FOLLOW UP: Our staff will call the number listed on your records 48-72 hours following your procedure to check on you and address any questions or concerns that you may have regarding the information given to you following your procedure. If we do not reach you, we will leave a message.  We will attempt to reach you two times.  During this call, we will ask if you have developed any symptoms of COVID 19. If you develop any symptoms (ie: fever, flu-like symptoms, shortness of breath, cough etc.) before then, please call [phone].  If you test positive for Covid 19 in the 2 weeks post procedure, please call and report this information to us.    If any biopsies were taken you will be contacted by phone or by letter within the next 1-3 weeks.  Please call us at [phone] if you have not heard about the biopsies in 3 weeks.    SIGNATURES/CONFIDENTIALITY: You and/or your care partner have signed paperwork which will be entered into your electronic medical record.  These signatures attest to the fact that that the information above on your After Visit Summary has been reviewed and is understood.  Full responsibility of the confidentiality of this discharge information lies with you and/or your care-partner. 

## 2019-09-04 NOTE — Op Note (Signed)
[facility] Patient Name: Victoria Carson Procedure Date: 09/04/2019 10:28 AM MRN: [id number] Endoscopist: Remo Lipps P. Havery Moros , MD Age: 23 Referring MD:  Date of Birth: 05-Jul-1996 Gender: Female Account #: [id number] Procedure:                Colonoscopy Indications:              Chronic diarrhea, Rectal bleeding Medicines:                Monitored Anesthesia Care Procedure:                Pre-Anesthesia Assessment:                           - Prior to the procedure, a History and Physical                            was performed, and patient medications and                            allergies were reviewed. The patient's tolerance of                            previous anesthesia was also reviewed. The risks                            and benefits of the procedure and the sedation                            options and risks were discussed with the patient.                            All questions were answered, and informed consent                            was obtained. Prior Anticoagulants: The patient has                            taken no previous anticoagulant or antiplatelet                            agents. ASA Grade Assessment: II - A patient with                            mild systemic disease. After reviewing the risks                            and benefits, the patient was deemed in                            satisfactory condition to undergo the procedure.                           After obtaining informed consent, the colonoscope  was passed under direct vision. Throughout the                            procedure, the patient's blood pressure, pulse, and                            oxygen saturations were monitored continuously. The                            Colonoscope was introduced through the anus and                            advanced to the the terminal ileum, with                            identification of the  appendiceal orifice and IC                            valve. The colonoscopy was performed without                            difficulty. The patient tolerated the procedure                            well. The quality of the bowel preparation was                            adequate. The terminal ileum, ileocecal valve,                            appendiceal orifice, and rectum were photographed. Scope In: 10:36:18 AM Scope Out: 11:03:25 AM Scope Withdrawal Time: 0 hours 24 minutes 18 seconds  Total Procedure Duration: 0 hours 27 minutes 7 seconds  Findings:                 The perianal and digital rectal examinations were                            normal.                           The terminal ileum appeared mostly normal, there                            was one area with some slight nodularity - I                            suspect normal variant but biopsies were taken with                            a cold forceps for histology.                           A 3 mm polyp was found in the ascending colon. The  polyp was sessile. The polyp was removed with a                            cold snare. Resection and retrieval were complete.                           A diminutive polyp was found in the recto-sigmoid                            colon. The polyp was sessile. The polyp was removed                            with a cold biopsy forceps. Resection and retrieval                            were complete.                           A 3 mm polyp was found in the rectum. The polyp was                            sessile. The polyp was removed with a cold biopsy                            forceps. Resection and retrieval were complete.                           A few small-mouthed diverticula were found in the                            ascending colon and left colon.                           Internal hemorrhoids were found during                             retroflexion. The hemorrhoids were small.                           The colon was tortous. The exam was otherwise                            without abnormality. After the exam was complete                            the right colon was intubated again to withdraw all                            air for patient comfort.                           Biopsies for histology were taken with a cold  forceps from the right colon, left colon and                            transverse colon for evaluation of microscopic                            colitis. Complications:            No immediate complications. Estimated blood loss:                            Minimal. Estimated Blood Loss:     Estimated blood loss was minimal. Impression:               - The examined portion of the ileum was normal                            other than mild nodularity and suspect normal                            variant. Biopsied.                           - One 3 mm polyp in the ascending colon, removed                            with a cold snare. Resected and retrieved.                           - One diminutive polyp at the recto-sigmoid colon,                            removed with a cold biopsy forceps. Resected and                            retrieved.                           - One 3 mm polyp in the rectum, removed with a cold                            biopsy forceps. Resected and retrieved.                           - Diverticulosis in the ascending colon and in the                            left colon.                           - Internal hemorrhoids.                           - The examination was otherwise normal.                           -  Biopsies were taken with a cold forceps from the                            right colon, left colon and transverse colon for                            evaluation of microscopic colitis. Of note,                            biopsies of ileum  and random colon were accidently                            placed into the same bottle. Recommendation:           - Patient has a contact number available for                            emergencies. The signs and symptoms of potential                            delayed complications were discussed with the                            patient. Return to normal activities tomorrow.                            Written discharge instructions were provided to the                            patient.                           - Resume previous diet.                           - Continue present medications.                           - Await pathology results. Further recommendations                            will be made based on the pathology results. Remo Lipps P. Armbruster, MD 09/04/2019 11:12:00 AM This report has been signed electronically.

## 2019-09-04 NOTE — Progress Notes (Signed)
Report to PACU, RN, vss, BBS= Clear.  

## 2019-09-04 NOTE — Progress Notes (Signed)
Temp check by KA/Vital check by CW.  Medical and surgical history updated per patient's report.

## 2019-09-06 ENCOUNTER — Telehealth: Payer: Self-pay | Admitting: *Deleted

## 2019-09-06 ENCOUNTER — Telehealth: Payer: Self-pay

## 2019-09-06 NOTE — Telephone Encounter (Signed)
  Follow up Call-  Call back number 09/04/2019  Post procedure Call Back phone  # [phone]  Permission to leave phone message Yes  Some recent data might be hidden     Patient questions:  Message left to call us if necessary.

## 2019-09-06 NOTE — Telephone Encounter (Signed)
  Follow up Call-  Call back number 09/04/2019  Post procedure Call Back phone  # [phone]  Permission to leave phone message Yes  Some recent data might be hidden     Patient questions:  Do you have a fever, pain , or abdominal swelling? No. Pain Score  0 *  Have you tolerated food without any problems? Yes.    Have you been able to return to your normal activities? Yes.    Do you have any questions about your discharge instructions: Diet   No. Medications  No. Follow up visit  No.  Do you have questions or concerns about your Care? No.  Actions: * If pain score is 4 or above: No action needed, pain <4. 1. Have you developed a fever since your procedure? no  2.   Have you had an respiratory symptoms (SOB or cough) since your procedure? no  3.   Have you tested positive for COVID 19 since your procedure no  4.   Have you had any family members/close contacts diagnosed with the COVID 19 since your procedure?  no   If yes to any of these questions please route to Joylene John, RN and Alphonsa Gin, Therapist, sports.

## 2019-09-09 ENCOUNTER — Encounter: Payer: Self-pay | Admitting: Internal Medicine

## 2019-09-10 ENCOUNTER — Other Ambulatory Visit: Payer: Self-pay | Admitting: Internal Medicine

## 2019-09-10 MED ORDER — SYNTHROID 100 MCG PO TABS: 100.0000 ug | ORAL_TABLET | Freq: Every day | ORAL | 3 refills | Status: DC

## 2019-09-10 NOTE — Telephone Encounter (Signed)
Please adviseg

## 2019-09-11 ENCOUNTER — Other Ambulatory Visit: Payer: Self-pay

## 2019-09-13 ENCOUNTER — Encounter: Payer: Self-pay | Admitting: Family Medicine

## 2019-09-17 ENCOUNTER — Encounter: Payer: Self-pay | Admitting: Internal Medicine

## 2019-09-18 ENCOUNTER — Encounter: Payer: Self-pay | Admitting: Family Medicine

## 2019-09-18 ENCOUNTER — Other Ambulatory Visit: Payer: Self-pay

## 2019-09-18 ENCOUNTER — Other Ambulatory Visit: Payer: Self-pay | Admitting: Family Medicine

## 2019-09-18 DIAGNOSIS — Z20822 Contact with and (suspected) exposure to covid-19: Secondary | ICD-10-CM

## 2019-09-20 ENCOUNTER — Encounter: Payer: Self-pay | Admitting: Family Medicine

## 2019-09-20 ENCOUNTER — Ambulatory Visit (INDEPENDENT_AMBULATORY_CARE_PROVIDER_SITE_OTHER): Payer: Self-pay | Admitting: Family Medicine

## 2019-09-20 ENCOUNTER — Other Ambulatory Visit: Payer: Self-pay

## 2019-09-20 ENCOUNTER — Telehealth: Payer: Self-pay

## 2019-09-20 VITALS — Temp 98.5°F | Ht 69.0 in | Wt 157.0 lb

## 2019-09-20 DIAGNOSIS — Z20822 Contact with and (suspected) exposure to covid-19: Secondary | ICD-10-CM

## 2019-09-20 DIAGNOSIS — Z20828 Contact with and (suspected) exposure to other viral communicable diseases: Secondary | ICD-10-CM

## 2019-09-20 LAB — NOVEL CORONAVIRUS, NAA: SARS-CoV-2, NAA: NOT DETECTED

## 2019-09-20 MED ORDER — DICYCLOMINE HCL 10 MG PO CAPS: 10.0000 mg | ORAL_CAPSULE | Freq: Three times a day (TID) | ORAL | 3 refills | Status: DC

## 2019-09-20 NOTE — Telephone Encounter (Signed)
Called patient to get more information on her symptoms and got voice  Mail. Left message to please call back

## 2019-09-20 NOTE — Progress Notes (Signed)
Virtual Visit via Video Note  I connected with Victoria Carson on 09/20/19 at  1:00 PM EDT by a video enabled telemedicine application and verified that I am speaking with the correct person using two identifiers. Location patient: home Location provider: work  Persons participating in the virtual visit: patient, provider  I discussed the limitations of evaluation and management by telemedicine and the availability of in person appointments. The patient expressed understanding and agreed to proceed.  Chief Complaint  Patient presents with  . Generalized Body Aches    Denies fever, coughing, vomitting,diarrhea   . Headache  . Chest Pain    today  . Nausea  . Constipation    HPI: Victoria Carson is a 23 y.o. female who is a patient of my colleague Dr. Deborra Medina. Pt complains of generalized body aches and temp of 99.8 that began on 09/17/19. She has been afebrile since 10/7 with temp today of 98.5. She corresponded via MyChart with PCP and had COVID test done on 09/18/19 (day 2 of symptoms) which came back negative. Pt denies chills but states she feels cold sometimes (not just in the past few days). She endorses mild nausea, constipation, and intermittent abdominal pain. These are not new symptoms. Pt follows with GI Dr. Havery Moros for ? IBS, recently had colonoscopy, and was recommended to use miralax and bentyl PRN for GI symptoms. Pt denies vomiting or diarrhea. Pt endorses headache and as of this AM chest pressure. The pressure is improved if she hunches her shoulder forward. Denies SOB, cough, sore throat, runny nose. She also endorses nasal congestion x 1 day. She has not taken anything for her symptoms.    Past Medical History:  Diagnosis Date  . Chronic cholecystitis 07/2016  . Dental crown present   . Difficulty swallowing pills   . Dust allergy   . GERD (gastroesophageal reflux disease)    no current med.  . Hypothyroidism (acquired)   . IBS (irritable bowel syndrome)   .  Interstitial cystitis 2019  . Migraines     Past Surgical History:  Procedure Laterality Date  . CHOLECYSTECTOMY N/A 08/08/2016   Procedure: LAPAROSCOPIC CHOLECYSTECTOMY;  Surgeon: Coralie Keens, MD;  Location: [place];  Service: General;  Laterality: N/A;  . WISDOM TOOTH EXTRACTION      Family History  Problem Relation Age of Onset  . Mental illness Mother   . Drug abuse Mother   . Heart failure Other   . Hypertension Other   . Depression Paternal Aunt   . Anxiety disorder Paternal Aunt   . Stroke Paternal Grandfather   . Liver disease Maternal Grandmother   . Colon polyps Neg Hx   . Esophageal cancer Neg Hx   . Stomach cancer Neg Hx   . Rectal cancer Neg Hx     Social History   Tobacco Use  . Smoking status: Never Smoker  . Smokeless tobacco: Never Used  Substance Use Topics  . Alcohol use: Yes    Comment: occassional  . Drug use: No     Current Outpatient Medications:  .  Cyanocobalamin (VITAMIN B 12 PO), Take 1 tablet by mouth daily., Disp: , Rfl:  .  medroxyPROGESTERone (DEPO-PROVERA) 150 MG/ML injection, Inject 150 mg into the muscle every 3 (three) months., Disp: , Rfl:  .  Selenium 200 MCG CAPS, Take 1 capsule by mouth daily., Disp: , Rfl:  .  SYNTHROID 100 MCG tablet, Take 1 tablet (100 mcg total) by mouth daily before  breakfast., Disp: 45 tablet, Rfl: 3 .  VITAMIN D PO, Take 1,000 Units by mouth daily., Disp: , Rfl:   Allergies  Allergen Reactions  . Citrus Itching and Other (See Comments)    SWEATING  . Sulfa Antibiotics Nausea Only, Other (See Comments) and Nausea And Vomiting    FEVER      ROS: See pertinent positives and negatives per HPI.   EXAM:  VITALS per patient if applicable: Temp 99991111 F (36.9 C)   Ht 5\' 9"  (1.753 m)   Wt 157 lb (71.2 kg)   BMI 23.18 kg/m    GENERAL: alert, oriented, appears well and in no acute distress  HEENT: atraumatic, conjunctiva clear, no obvious abnormalities on inspection of  external nose and ears  NECK: normal movements of the head and neck  LUNGS: on inspection no signs of respiratory distress, breathing rate appears normal, no obvious gross SOB, gasping or wheezing, no conversational dyspnea  CV: no obvious cyanosis  PSYCH/NEURO: pleasant and cooperative, speech and thought processing grossly intact   ASSESSMENT AND PLAN: 1. Suspected COVID-19 virus infection - pt had negative test on 10/7 (less than 24hrs after symptom onset) so could be false positive due to testing too early and not waiting until day 3-5 of symptoms - recommend self-isolation at home - pt should do so for at least 10 days from symptom onset and at least 24hrs without fever and use of antipyretic and symptoms overall improving - recommend supportive care to include increased water intake, rest, tylenol or ibuprofen as needed - note for work sent to pt via [facility] home monitoring ordered - advised f/u or that pt seek care at [facility] or ER if she develops persistent SOB, fever that will not improve with antipyretic, etc and pt expressed understanding   I discussed the assessment and treatment plan with the patient. The patient was provided an opportunity to ask questions and all were answered. The patient agreed with the plan and demonstrated an understanding of the instructions.   The patient was advised to call back or seek an in-person evaluation if the symptoms worsen or if the condition fails to improve as anticipated.   [name], DO

## 2019-09-20 NOTE — Telephone Encounter (Signed)
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 Looks like given fever and aches she is seeing Dr. Bryan Lemma at [facility] clinic today for that issue. Agree with the Miralax, and we can offer her some bentyl to use for cramps to see if that will help as well.

## 2019-09-20 NOTE — Patient Instructions (Signed)
recommend self-isolation at home for at least 10 days from symptom onset and at least 24hrs without fever and use of antipyretic and symptoms overall improving [email or web address]

## 2019-09-20 NOTE — Telephone Encounter (Signed)
Called patient and she does want to try the Bentyl. What dose/frequency  would you like me to order

## 2019-09-20 NOTE — Telephone Encounter (Signed)
10mg  tab - 1-2 tabs every 8 hours. Can give #[phone]. Thanks

## 2019-09-20 NOTE — Telephone Encounter (Signed)
Patient called back and says she had a low grade fever on Tues. And has felt achy for a few days. She got COVID tested and it was neg. No fever now. Woke-up during the night with bad pain going down he right side and then a little later down her left side. States not sure id it is form my ovaries or something else. She is still having constipation and I encouraged her (as Dr. Havery Moros has suggested) to try daily Miralax to get soft formed stools. She said she would try it.

## 2019-09-21 ENCOUNTER — Encounter (INDEPENDENT_AMBULATORY_CARE_PROVIDER_SITE_OTHER): Payer: Self-pay

## 2019-09-23 NOTE — Telephone Encounter (Signed)
Pt received letter from appointment on 10/9.

## 2019-09-24 ENCOUNTER — Ambulatory Visit: Payer: Self-pay | Admitting: Gastroenterology

## 2019-09-26 ENCOUNTER — Other Ambulatory Visit: Payer: Self-pay

## 2019-09-30 ENCOUNTER — Encounter: Payer: Self-pay | Admitting: Family Medicine

## 2019-10-03 ENCOUNTER — Encounter: Payer: Self-pay | Admitting: Family Medicine

## 2019-10-03 ENCOUNTER — Other Ambulatory Visit: Payer: Self-pay

## 2019-10-03 ENCOUNTER — Ambulatory Visit (INDEPENDENT_AMBULATORY_CARE_PROVIDER_SITE_OTHER): Payer: Self-pay | Admitting: Family Medicine

## 2019-10-03 DIAGNOSIS — M25561 Pain in right knee: Secondary | ICD-10-CM

## 2019-10-03 DIAGNOSIS — M25562 Pain in left knee: Secondary | ICD-10-CM

## 2019-10-03 NOTE — Patient Instructions (Signed)
Health Maintenance Due  Topic Date Due  . PAP-Cervical Cytology Screening  04/27/2018  . PAP SMEAR-Modifier  04/27/2018  Will discuss during visit.  Depression screen [facility] 2/9 07/31/2019 05/07/2019 10/17/2016  Decreased Interest 1 2 3   Down, Depressed, Hopeless 0 1 3  PHQ - 2 Score 1 3 6   Altered sleeping 3 3 2   Tired, decreased energy 3 3 3   Change in appetite 0 0 0  Feeling bad or failure about yourself  1 3 1   Trouble concentrating 0 3 3  Moving slowly or fidgety/restless 1 3 3   Suicidal thoughts 0 0 1  PHQ-9 Score 9 18 19   Difficult doing work/chores Somewhat difficult Very difficult -

## 2019-10-03 NOTE — Progress Notes (Signed)
TELEPHONE ENCOUNTER   Patient verbally agreed to telephone visit and is aware that copayment and coinsurance may apply. Patient was treated using telemedicine according to accepted telemedicine protocols.  Location of the patient: patient's home Location of provider: provider's home  Names of all persons participating in the telemedicine service and role in the encounter: [name], MD Arcelia Jew  Subjective:   Chief Complaint  Patient presents with  . Knee Pain    Both knees x 4 months.  Lt side radiates up to her hip,  pt explains that it hurts when she walks.Pt thinks that the pain comes from her standing at work.     HPI   Bilateral knee pain intermittently for four months.  Past month, it has worsened pain is now a 10 at its worse (usually at night when she i.  Walking hurts.  She is on her feet all day at work.  Hurts mostly around patella- anterior knee.   Left pain starting radiating up to her hip this week.  Wears sneakers at work but not sure how supportive they are- she can stand for 10 hours at a time working at [employer].  She also is unsure if she has low arches or high arches.  Has not tried any NSAIDs or Tylenol as needed for pain.    Patient Active Problem List   Diagnosis Date Noted  . Knee pain, bilateral 10/03/2019  . Change in bowel habits 08/22/2019  . Chest pain 07/31/2019  . Encounter for examination following treatment at hospital 07/31/2019  . Vitamin D deficiency 07/31/2019  . Lower extremity weakness 06/05/2019  . Palpitations 03/12/2019  . Hypothyroidism due to Hashimoto's thyroiditis 01/24/2019  . Fatigue 07/06/2018  . Acne vulgaris 10/25/2017  . Interstitial cystitis 10/17/2016  . Encounter for birth control 10/17/2016  . MDD (major depressive disorder), recurrent episode, severe ([place]) 02/15/2013  . GAD (generalized anxiety disorder) 02/15/2013   Social History   Tobacco Use  . Smoking status: Never Smoker  . Smokeless  tobacco: Never Used  Substance Use Topics  . Alcohol use: Yes    Comment: occassional    Current Outpatient Medications:  .  Cyanocobalamin (VITAMIN B 12 PO), Take 1 tablet by mouth daily., Disp: , Rfl:  .  medroxyPROGESTERone (DEPO-PROVERA) 150 MG/ML injection, Inject 150 mg into the muscle every 3 (three) months., Disp: , Rfl:  .  Selenium 200 MCG CAPS, Take 1 capsule by mouth daily., Disp: , Rfl:  .  SYNTHROID 100 MCG tablet, Take 1 tablet (100 mcg total) by mouth daily before breakfast., Disp: 45 tablet, Rfl: 3 .  VITAMIN D PO, Take 1,000 Units by mouth daily., Disp: , Rfl:  Allergies  Allergen Reactions  . Citrus Itching and Other (See Comments)    SWEATING  . Sulfa Antibiotics Nausea Only, Other (See Comments) and Nausea And Vomiting    FEVER    Assessment & Plan:   1. Pain in both knees, unspecified chronicity     Orders Placed This Encounter  Procedures  . Ambulatory referral to Sports Medicine   No orders of the defined types were placed in this encounter.   [name], MD 10/03/2019  Time spent with the patient: 11 minutes, spent in obtaining information about her symptoms, reviewing her previous labs, evaluations, and treatments, counseling her about her condition (please see the discussed topics above), and developing a plan to further investigate it; she had a number of questions which I addressed.   [place]  physician/qualified health professional telephone evaluation 5 to 10 minutes [id number] physician/qualified help functional [place] evaluation for 11 to 20 minutes [id number] physician/qualify he will professional telephone evaluation for 21 to 30 minutes

## 2019-10-03 NOTE — Assessment & Plan Note (Signed)
>  11 minutes spent in face to face time with patient, >50% spent in counselling or coordination of care discussing knee pain.  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    I explained she can ice her knees at night, take up to 800 mg of ibuprofen three times daily (with food), especially on her longer shift days or at night when she is at a 10/10 in terms of pain.  Refer to sports medicine for further evaluation and treatment.  She is self pay so I am trying to avoid multiple OV at different offices for her.  The patient indicates understanding of these issues and agrees with the plan.  Orders Placed This Encounter  Procedures  . Ambulatory referral to Sports Medicine

## 2019-10-14 ENCOUNTER — Other Ambulatory Visit: Payer: Self-pay | Admitting: Physical Therapy

## 2019-10-14 ENCOUNTER — Ambulatory Visit: Payer: Self-pay | Admitting: Family Medicine

## 2019-10-17 ENCOUNTER — Ambulatory Visit: Payer: Self-pay | Admitting: Gastroenterology

## 2019-10-25 ENCOUNTER — Other Ambulatory Visit: Payer: Self-pay

## 2019-10-25 ENCOUNTER — Other Ambulatory Visit (INDEPENDENT_AMBULATORY_CARE_PROVIDER_SITE_OTHER): Payer: Self-pay

## 2019-10-25 DIAGNOSIS — E063 Autoimmune thyroiditis: Secondary | ICD-10-CM

## 2019-10-25 DIAGNOSIS — E038 Other specified hypothyroidism: Secondary | ICD-10-CM

## 2019-10-25 LAB — TSH: TSH: 2.55 u[IU]/mL (ref 0.35–4.50)

## 2019-10-25 LAB — T4, FREE: Free T4: 0.97 ng/dL (ref 0.60–1.60)

## 2019-11-14 ENCOUNTER — Encounter: Payer: Self-pay | Admitting: Family Medicine

## 2019-11-19 DIAGNOSIS — Z01419 Encounter for gynecological examination (general) (routine) without abnormal findings: Secondary | ICD-10-CM | POA: Insufficient documentation

## 2019-11-19 DIAGNOSIS — N898 Other specified noninflammatory disorders of vagina: Secondary | ICD-10-CM | POA: Insufficient documentation

## 2019-11-19 DIAGNOSIS — N941 Unspecified dyspareunia: Secondary | ICD-10-CM | POA: Insufficient documentation

## 2019-11-19 NOTE — Progress Notes (Signed)
Subjective:    Patient ID: Victoria Carson, female    DOB: 1996/04/20, 23 y.o.   MRN: [id number]  Chief Complaint  Patient presents with  . Gynecologic Exam    Pain with during intercourse.  Pt would like STD testing today..  Pt also c/o pelvic pain when snezzing.  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  . Vaginal Discharge    Pt c/o bleeding during and after intercourse, enough to put on a pad after for the day.    HPI Patient is in today for PAP. She states that she is experiencing pain with intercourse and vaginal discharge. The pain is so intense that she avoids intercourse.  The discharge ceased 2-years-ago but has since restarted. She feels like she may have a yeast infection but is unsure. Patient also c/o bleeding during and after intercourse, enough for her to wear a pad afterwards and that she is also experiencing pelvic pain when she sneezes.  Patient explained that she would like STD testing performed today and that she currently sees a provider at [employer].  She does feel pain in her pelvic area and rectum during her period.  No sexual trauma.  She states that she will set up a OB/GYN in February but she didn't want to wait that long for her issues.  Pt sent the following mychart message:  "Can I set up an appt with you to have a Pap smear done? I think I might a yeast infection as well, and also I stopped having discharge for about 2 years completely and I started having some again it so I'm unsure about that too. Also, sex really really hurts bad for me more than it ever did before to the point I avoid having it now. I'm getting health insurance in February, so I'll make an appt with the OBGYN but I wanted to see if there would be anything you could help me with in the meantime."   Past Medical History:  Diagnosis Date  . Chronic cholecystitis 07/2016  . Dental crown present   . Difficulty swallowing pills   . Dust allergy   . GERD (gastroesophageal  reflux disease)    no current med.  . Hypothyroidism (acquired)   . IBS (irritable bowel syndrome)   . Interstitial cystitis 2019  . Migraines     Past Surgical History:  Procedure Laterality Date  . CHOLECYSTECTOMY N/A 08/08/2016   Procedure: LAPAROSCOPIC CHOLECYSTECTOMY;  Surgeon: Coralie Keens, MD;  Location: [place];  Service: General;  Laterality: N/A;  . WISDOM TOOTH EXTRACTION      Family History  Problem Relation Age of Onset  . Mental illness Mother   . Drug abuse Mother   . Heart failure Other   . Hypertension Other   . Depression Paternal Aunt   . Anxiety disorder Paternal Aunt   . Stroke Paternal Grandfather   . Liver disease Maternal Grandmother   . Colon polyps Neg Hx   . Esophageal cancer Neg Hx   . Stomach cancer Neg Hx   . Rectal cancer Neg Hx     Social History   Socioeconomic History  . Marital status: Single    Spouse name: n/a  . Number of children: 0  . Years of education: Not on file  . Highest education level: Not on file  Occupational History  . 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 Financial resource strain:  Not on file  . Food insecurity    Worry: Not on file    Inability: Not on file  . Transportation needs    Medical: Not on file    Non-medical: Not on file  Tobacco Use  . Smoking status: Never Smoker  . Smokeless tobacco: Never Used  Substance and Sexual Activity  . Alcohol use: Yes    Comment: occassional  . Drug use: No  . Sexual activity: Not on file  Lifestyle  . Physical activity    Days per week: Not on file    Minutes per session: Not on file  . Stress: Not on file  Relationships  . Social Herbalist on phone: Not on file    Gets together: Not on file    Attends religious service: Not on file    Active member of club or organization: Not on file    Attends meetings of clubs or organizations: Not on file    Relationship status: Not on file  .  Intimate partner violence    Fear of current or ex partner: Not on file    Emotionally abused: Not on file    Physically abused: Not on file    Forced sexual activity: Not on file  Other Topics Concern  . Not on file  Social History Narrative   05/01/2013 AHW Victoria Carson was poor in [place], [place].. She currently lives in [place] with her father, grandfather, older sister, and younger brother. She currently is homeschooled and in the 10th grade. She has a boyfriend of 2 months. 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 2 years ago, she was caught shoplifting. She reports that her social support system consists of a friend. 05/01/2013 AHW      Single. Lives with father.    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   Enjoys spending time with her friends.                Outpatient Medications Prior to Visit  Medication Sig Dispense Refill  . Cyanocobalamin (VITAMIN B 12 PO) Take 1 tablet by mouth daily.    . medroxyPROGESTERone (DEPO-PROVERA) 150 MG/ML injection Inject 150 mg into the muscle every 3 (three) months.    . Selenium 200 MCG CAPS Take 1 capsule by mouth daily.    Marland Kitchen SYNTHROID 100 MCG tablet Take 1 tablet (100 mcg total) by mouth daily before breakfast. 45 tablet 3  . VITAMIN Konica Stankowski PO Take 1,000 Units by mouth daily.     No facility-administered medications prior to visit.     Allergies  Allergen Reactions  . Citrus Itching and Other (See Comments)    SWEATING  . Sulfa Antibiotics Nausea Only, Other (See Comments) and Nausea And Vomiting    FEVER    Review of Systems  Constitutional: Negative.  Negative for chills and fever.  HENT: Negative.  Negative for congestion and hearing loss.   Eyes: Negative.  Negative for discharge and redness.  Respiratory: Negative.  Negative for cough and shortness of breath.   Cardiovascular: Negative.  Negative for chest pain.  Gastrointestinal: Negative.  Negative for abdominal pain and heartburn.   Genitourinary: Negative for dysuria.       Positive for vaginal discharge and painful intercourse. Pelvic pain  Musculoskeletal: Negative.  Negative for falls and myalgias.  Skin: Negative.  Negative for rash.  Neurological: Negative for dizziness and loss of consciousness.  Endo/Heme/Allergies: Negative.  Does not bruise/bleed easily.  Psychiatric/Behavioral: Negative.  Negative for depression and memory loss.  All other systems reviewed and are negative.      Objective:     General:  Well-developed,well-nourished,in no acute distress; alert,appropriate and cooperative throughout examination Head:  normocephalic and atraumatic.   Eyes:  vision grossly intact, PERRL Ears:  R ear normal and L ear normal externally, TMs clear bilaterally Nose:  no external deformity.   Mouth:  good dentition.   Neck:  No deformities, masses, or tenderness noted. Breasts:  No mass, nodules, thickening, tenderness, bulging, retraction, inflamation, nipple discharge or skin changes noted.   Lungs:  Normal respiratory effort, chest expands symmetrically. Lungs are clear to auscultation, no crackles or wheezes. Heart:  Normal rate and regular rhythm. S1 and S2 normal without gallop, murmur, click, rub or other extra sounds. Abdomen:  Bowel sounds positive,abdomen soft and non-tender without masses, organomegaly or hernias noted. Rectal:  no external abnormalities.   Genitalia:  Pelvic Exam:        External: normal female genitalia without lesions or masses        Vagina: normal without lesions or masses        Cervix: normal without lesions or masses        Adnexa: normal bimanual exam without masses or fullness        Uterus: normal by palpation        Pap smear: performed Msk:  No deformity or scoliosis noted of thoracic or lumbar spine.   Extremities:  No clubbing, cyanosis, edema, or deformity noted with normal full range of motion of all joints.   Neurologic:  alert & oriented X3 and gait normal.    Skin:  Intact without suspicious lesions or rashes Cervical Nodes:  No lymphadenopathy noted Axillary Nodes:  No palpable lymphadenopathy Psych:  Cognition and judgment appear intact. Alert and cooperative with normal attention span and concentration. No apparent delusions, illusions, hallucinations  BP 90/70 (BP Location: Right Arm, Patient Position: Sitting, Cuff Size: Normal)   Pulse 64   Temp (!) 97.4 F (36.3 C)   Ht 5\' 9"  (1.753 m)   Wt 159 lb 12.8 oz (72.5 kg)   SpO2 98%   BMI 23.60 kg/m  Wt Readings from Last 3 Encounters:  11/20/19 159 lb 12.8 oz (72.5 kg)  10/03/19 157 lb (71.2 kg)  09/20/19 157 lb (71.2 kg)     Lab Results  Component Value Date   WBC 4.2 07/30/2019   HGB 13.3 07/30/2019   HCT 40.2 07/30/2019   PLT 216 07/30/2019   GLUCOSE 93 07/30/2019   ALT 19 07/30/2019   AST 20 07/30/2019   NA 139 07/30/2019   K 3.8 07/30/2019   CL 108 07/30/2019   CREATININE 0.65 07/30/2019   BUN 12 07/30/2019   CO2 23 07/30/2019   TSH 2.55 10/25/2019    Lab Results  Component Value Date   TSH 2.55 10/25/2019   Lab Results  Component Value Date   WBC 4.2 07/30/2019   HGB 13.3 07/30/2019   HCT 40.2 07/30/2019   MCV 93.9 07/30/2019   PLT 216 07/30/2019   Lab Results  Component Value Date   NA 139 07/30/2019   K 3.8 07/30/2019   CO2 23 07/30/2019   GLUCOSE 93 07/30/2019   BUN 12 07/30/2019   CREATININE 0.65 07/30/2019   BILITOT 0.8 07/30/2019   ALKPHOS 74 07/30/2019   AST 20 07/30/2019   ALT 19 07/30/2019   PROT 6.9 07/30/2019   ALBUMIN 3.9  07/30/2019   CALCIUM 9.0 07/30/2019   ANIONGAP 8 07/30/2019   GFR 99.59 06/05/2019   No results found for: CHOL No results found for: HDL No results found for: LDLCALC No results found for: TRIG No results found for: CHOLHDL No results found for: HGBA1C     Assessment & Plan:   Problem List Items Addressed This Visit      Active Problems   Encounter for gynecological examination   Relevant Orders    Cytology - PAP( [place])   Urine cytology ancillary only([place])   Dyspareunia in female   Relevant Orders   Cytology - PAP( [place])   Urine cytology ancillary only([place])   Vaginal discharge   Relevant Orders   Cytology - PAP( [place])   Urine cytology ancillary only([place])    Other Visit Diagnoses    Painful coitus, female    -  Primary   Relevant Orders   Cytology - PAP( [place])   Urine cytology ancillary only()      I am having Victoria Carson maintain her medroxyPROGESTERone, Cyanocobalamin (VITAMIN B 12 PO), VITAMIN Timia Casselman PO, Selenium, and Synthroid.  No orders of the defined types were placed in this encounter.    [name], MD  This visit occurred during the SARS-CoV-2 public health emergency.  Safety protocols were in place, including screening questions prior to the visit, additional usage of staff PPE, and extensive cleaning of exam room while observing appropriate contact time as indicated for disinfecting solutions.

## 2019-11-20 ENCOUNTER — Other Ambulatory Visit (HOSPITAL_COMMUNITY)
Admission: RE | Admit: 2019-11-20 | Discharge: 2019-11-20 | Disposition: A | Discharge status: Home / Self Care | Payer: Self-pay | Source: Ambulatory Visit | Attending: Family Medicine | Admitting: Family Medicine

## 2019-11-20 ENCOUNTER — Encounter: Payer: Self-pay | Admitting: Family Medicine

## 2019-11-20 ENCOUNTER — Other Ambulatory Visit: Payer: Self-pay

## 2019-11-20 ENCOUNTER — Ambulatory Visit: Payer: Self-pay | Admitting: Family Medicine

## 2019-11-20 VITALS — BP 90/70 | HR 64 | Temp 97.4°F | Ht 69.0 in | Wt 159.8 lb

## 2019-11-20 DIAGNOSIS — N898 Other specified noninflammatory disorders of vagina: Secondary | ICD-10-CM

## 2019-11-20 DIAGNOSIS — N941 Unspecified dyspareunia: Secondary | ICD-10-CM

## 2019-11-20 DIAGNOSIS — Z01419 Encounter for gynecological examination (general) (routine) without abnormal findings: Secondary | ICD-10-CM

## 2019-11-20 DIAGNOSIS — Z01411 Encounter for gynecological examination (general) (routine) with abnormal findings: Secondary | ICD-10-CM | POA: Insufficient documentation

## 2019-11-20 NOTE — Assessment & Plan Note (Signed)
STD testing done today.

## 2019-11-20 NOTE — Assessment & Plan Note (Addendum)
Has been occurring for over a year but has had pelvic pain for many years. ? Endometriosis. Advised her to call her GYN. The patient indicates understanding of these issues and agrees with the plan.

## 2019-11-20 NOTE — Patient Instructions (Signed)
Dyspareunia, Female Dyspareunia is pain that is associated with sexual activity. This can affect any part of the genitals or lower abdomen. There are many possible causes of this condition. In some cases, diagnosing the cause of dyspareunia can be difficult. This condition can be mild, moderate, or severe. Depending on the cause, dyspareunia may get better with treatment, but may return (recur) over time. What are the causes?  The cause of this condition is not always known. However, problems that affect the vulva, vagina, uterus, and other organs may cause dyspareunia. Common causes of this condition include:  Vaginal dryness.  Giving birth.  Infection.  Skin changes or conditions.  Side effects of medicines.  Endometriosis. This is when tissue that is like the lining of the uterus grows on the outside of the uterus.  Psychological conditions. These include depression, anxiety, or traumatic experiences.  Allergic reaction. What increases the risk? The following factors may make you more likely to develop this condition:  History of physical or sexual trauma.  Some medicines.  No longer having a monthly period (menopause).  Having recently given birth.  Taking baths using soaps that have perfumes. These can cause irritation.  Douching. What are the signs or symptoms? The main symptom of this condition is pain in any part of your genitals or lower abdomen during or after sex. This may include:  Irritation, burning, or stinging sensations in your vulva.  Discomfort when your vulva or surrounding area is touched.  Aching and throbbing pain that may be constant.  Pain that gets worse when something is inserted into your vagina. How is this diagnosed? This condition may be diagnosed based on:  Your symptoms, including where and when your pain occurs.  Your medical history.  A physical exam. A pelvic exam will most likely be done.  Tests that include ultrasound,  blood tests, and tests that check the body for infection.  Imaging tests, such as X-ray, MRI, and CT scan. You may be referred to a health care provider who specializes in women's health (gynecologist). How is this treated? Treatment depends on the cause of your condition and your symptoms. In most cases, you may need to stop sexual activity until your symptoms go away or get better. Treatment may include:  Lubricants, ointments, and creams.  Physical therapy.  Massage therapy.  Hormonal therapy.  Medicines to: ? Prevent or fight infection. ? Relieve pain. ? Help numb the area. ? Treat depression (antidepressants).  Counseling, which may include sex therapy.  Surgery. Follow these instructions at home: Lifestyle  Wear cotton underwear.  Use water-based lubricants as needed during sex. Avoid oil-based lubricants.  Do not use any products that can cause irritation. This may include certain condoms, spermicides, lubricants, soaps, tampons, vaginal sprays, or douches.  Always practice safe sex. Use a condom to prevent sexually transmitted infections (STIs).  Talk freely with your partner about your condition. General instructions  Take or apply over-the-counter and prescription medicines only as told by your health care provider.  Urinate before you have sex.  Consider joining a support group.  Get the results of any tests you have done. Ask your health care provider, or the department that is doing the procedure, when your results will be ready.  Keep all follow-up visits as told by your health care provider. This is important. Contact a health care provider if:  You have vaginal bleeding after having sex.  You develop a lump at the opening of your vagina even if the   lump is painless.  You have: ? Abnormal discharge from your vagina. ? Vaginal dryness. ? Itchiness or irritation of your vulva or vagina. ? A new rash. ? Symptoms that get worse or do not improve  with treatment. ? A fever. ? Pain when you urinate. ? Blood in your urine. Get help right away if:  You have severe pain in your abdomen during or shortly after sex.  You pass out after sex. Summary  Dyspareunia is pain that is associated with sexual activity. This can affect any part of the genitals or lower abdomen.  There are many causes of this condition. Treatment depends on the cause and your symptoms. In most cases, you may need to stop sexual activity until your symptoms improve.  Take or apply over-the-counter and prescription medicines only as told by your health care provider.  Contact a health care provider if your symptoms get worse or do not improve with treatment.  Keep all follow-up visits as told by your health care provider. This is important. This information is not intended to replace advice given to you by your health care provider. Make sure you discuss any questions you have with your health care provider. Document Released: 12/18/2007 Document Revised: 02/04/2019 Document Reviewed: 02/04/2019 Elsevier Patient Education  2020 [employer].

## 2019-11-20 NOTE — Assessment & Plan Note (Signed)
Pap smear done today. She is currently sexually active.

## 2019-11-28 ENCOUNTER — Encounter: Payer: Self-pay | Admitting: Family Medicine

## 2019-11-28 ENCOUNTER — Telehealth (INDEPENDENT_AMBULATORY_CARE_PROVIDER_SITE_OTHER): Payer: Self-pay | Admitting: Family Medicine

## 2019-11-28 ENCOUNTER — Encounter: Payer: Self-pay | Admitting: Internal Medicine

## 2019-11-28 ENCOUNTER — Other Ambulatory Visit: Payer: Self-pay

## 2019-11-28 ENCOUNTER — Ambulatory Visit (INDEPENDENT_AMBULATORY_CARE_PROVIDER_SITE_OTHER): Payer: Self-pay | Admitting: Internal Medicine

## 2019-11-28 DIAGNOSIS — E063 Autoimmune thyroiditis: Secondary | ICD-10-CM

## 2019-11-28 DIAGNOSIS — E038 Other specified hypothyroidism: Secondary | ICD-10-CM

## 2019-11-28 DIAGNOSIS — H9202 Otalgia, left ear: Secondary | ICD-10-CM

## 2019-11-28 MED ORDER — CIPROFLOXACIN-DEXAMETHASONE 0.3-0.1 % OT SUSP: 4.0000 [drp] | Freq: Two times a day (BID) | OTIC | 0 refills | Status: DC

## 2019-11-28 NOTE — Telephone Encounter (Signed)
I called Victoria Carson and she is willing to have a VV w/Dr. Deborra Medina today.  Victoria Carson placed on schedule for 12pm today.

## 2019-11-28 NOTE — Patient Instructions (Addendum)
Please continue Synthroid 88 mcg daily.  Take the thyroid hormone every day, with water, at least 30 minutes before breakfast, separated by at least 4 hours from: - acid reflux medications - calcium - iron - multivitamins  Please come back for labs in ~3 mo  Please return in 1 year.

## 2019-11-28 NOTE — Assessment & Plan Note (Signed)
Since I cannot visualize her ear, It seems this could be a combination of swimmers ear and she has some hearing loss, question possible cerumen impaction as well.  Advised to buy OTC debrox. eRX sent in for cipro dex. Instructed to keep ear dry until better; eardrops per orders, call if persistent pain, swelling or fever, FUV prn.  See my chart message for more details.

## 2019-11-28 NOTE — Progress Notes (Signed)
Virtual Visit via Video   Due to the COVID-19 pandemic, this visit was completed with telemedicine (audio/video) technology to reduce patient and provider exposure as well as to preserve personal protective equipment.   I connected with Victoria Carson by a video enabled telemedicine application and verified that I am speaking with the correct person using two identifiers. Location patient: Home Location provider: [facility], Office Persons participating in the virtual visit: [name], [name], MD   I discussed the limitations of evaluation and management by telemedicine and the availability of in person appointments. The patient expressed understanding and agreed to proceed.  Care Team   Patient Care Team: [name], MD as PCP - General (Family Medicine) [name], MD as PCP - Cardiology (Cardiology)  Subjective:   HPI:   23 y.o. female complains of pain in left ear for 2 days. No fever or URI symptoms. Has has not been swimming but wears a head set on her left ear which builds moisture.  Feels like previous times she had swimmers ear.  No debris.  She did notice some hearing loss this morning.    Review of Systems  Constitutional: Negative for fever and malaise/fatigue.  HENT: Positive for ear pain and hearing loss. Negative for congestion, ear discharge, nosebleeds, sinus pain and sore throat.   Eyes: Negative for blurred vision, discharge and redness.  Respiratory: Negative for cough, shortness of breath and stridor.   Cardiovascular: Negative for chest pain, palpitations and leg swelling.  Gastrointestinal: Negative for abdominal pain and heartburn.  Genitourinary: Negative for dysuria.  Musculoskeletal: Negative for falls.  Skin: Negative for rash.  Neurological: Negative for loss of consciousness and headaches.  Endo/Heme/Allergies: Does not bruise/bleed easily.  Psychiatric/Behavioral: Negative for depression.  All other systems reviewed and  are negative.    Patient Active Problem List   Diagnosis Date Noted  . Left ear pain 11/28/2019  . Encounter for gynecological examination 11/19/2019  . Dyspareunia in female 11/19/2019  . Vaginal discharge 11/19/2019  . Knee pain, bilateral 10/03/2019  . Change in bowel habits 08/22/2019  . Chest pain 07/31/2019  . Encounter for examination following treatment at hospital 07/31/2019  . Vitamin D deficiency 07/31/2019  . Lower extremity weakness 06/05/2019  . Palpitations 03/12/2019  . Hypothyroidism due to Hashimoto's thyroiditis 01/24/2019  . Fatigue 07/06/2018  . Acne vulgaris 10/25/2017  . Interstitial cystitis 10/17/2016  . Encounter for birth control 10/17/2016  . MDD (major depressive disorder), recurrent episode, severe ([place]) 02/15/2013  . GAD (generalized anxiety disorder) 02/15/2013    Social History   Tobacco Use  . Smoking status: Never Smoker  . Smokeless tobacco: Never Used  Substance Use Topics  . Alcohol use: Yes    Comment: occassional    Current Outpatient Medications:  .  Cyanocobalamin (VITAMIN B 12 PO), Take 1 tablet by mouth daily., Disp: , Rfl:  .  medroxyPROGESTERone (DEPO-PROVERA) 150 MG/ML injection, Inject 150 mg into the muscle every 3 (three) months., Disp: , Rfl:  .  Selenium 200 MCG CAPS, Take 1 capsule by mouth daily., Disp: , Rfl:  .  SYNTHROID 100 MCG tablet, Take 1 tablet (100 mcg total) by mouth daily before breakfast., Disp: 45 tablet, Rfl: 3 .  VITAMIN D PO, Take 1,000 Units by mouth daily., Disp: , Rfl:  .  ciprofloxacin-dexamethasone (CIPRODEX) OTIC suspension, Place 4 drops into the left ear 2 (two) times daily., Disp: 7.5 mL, Rfl: 0  Allergies  Allergen Reactions  .  Citrus Itching and Other (See Comments)    SWEATING  . Sulfa Antibiotics Nausea Only, Other (See Comments) and Nausea And Vomiting    FEVER    Objective:  Ht 5\' 9"  (1.753 m)   Wt 159 lb (72.1 kg)   BMI 23.48 kg/m   VITALS: Per patient if applicable, see  vitals. GENERAL: Alert, appears well and in no acute distress. HEENT: Left ear reveals tenderness of the tragus; debris and inflammation in external canal.  NECK: Normal movements of the head and neck. CARDIOPULMONARY: No increased WOB. Speaking in clear sentences. I:E ratio WNL.  MS: Moves all visible extremities without noticeable abnormality. PSYCH: Pleasant and cooperative, well-groomed. Speech normal rate and rhythm. Affect is appropriate. Insight and judgement are appropriate. Attention is focused, linear, and appropriate.  NEURO: CN grossly intact. Oriented as arrived to appointment on time with no prompting. Moves both UE equally.  SKIN: No obvious lesions, wounds, erythema, or cyanosis noted on face or hands.  Depression screen [facility] 2/9 11/20/2019 07/31/2019 05/07/2019  Decreased Interest 1 1 2   Down, Depressed, Hopeless 1 0 1  PHQ - 2 Score 2 1 3   Altered sleeping 3 3 3   Tired, decreased energy 2 3 3   Change in appetite 0 0 0  Feeling bad or failure about yourself  0 1 3  Trouble concentrating 1 0 3  Moving slowly or fidgety/restless 1 1 3   Suicidal thoughts 0 0 0  PHQ-9 Score 9 9 18   Difficult doing work/chores Somewhat difficult Somewhat difficult Very difficult     . COVID-19 Education: The signs and symptoms of COVID-19 were discussed with the patient and how to seek care for testing if needed. The importance of social distancing was discussed today. . Reviewed expectations re: course of current medical issues. . Discussed self-management of symptoms. . Outlined signs and symptoms indicating need for more acute intervention. . Patient verbalized understanding and all questions were answered. Marland Kitchen Health Maintenance issues including appropriate healthy diet, exercise, and smoking avoidance were discussed with patient. . See orders for this visit as documented in the electronic medical record.  [name], MD  Records requested if needed. Time spent: 15  minutes, of which  >50% was spent in obtaining information about her symptoms, reviewing her previous labs, evaluations, and treatments, counseling her about her condition (please see the discussed topics above), and developing a plan to further investigate it; she had a number of questions which I addressed.   Lab Results  Component Value Date   WBC 4.2 07/30/2019   HGB 13.3 07/30/2019   HCT 40.2 07/30/2019   PLT 216 07/30/2019   GLUCOSE 93 07/30/2019   ALT 19 07/30/2019   AST 20 07/30/2019   NA 139 07/30/2019   K 3.8 07/30/2019   CL 108 07/30/2019   CREATININE 0.65 07/30/2019   BUN 12 07/30/2019   CO2 23 07/30/2019   TSH 2.55 10/25/2019    Lab Results  Component Value Date   TSH 2.55 10/25/2019   Lab Results  Component Value Date   WBC 4.2 07/30/2019   HGB 13.3 07/30/2019   HCT 40.2 07/30/2019   MCV 93.9 07/30/2019   PLT 216 07/30/2019   Lab Results  Component Value Date   NA 139 07/30/2019   K 3.8 07/30/2019   CO2 23 07/30/2019   GLUCOSE 93 07/30/2019   BUN 12 07/30/2019   CREATININE 0.65 07/30/2019   BILITOT 0.8 07/30/2019   ALKPHOS 74 07/30/2019   AST 20  07/30/2019   ALT 19 07/30/2019   PROT 6.9 07/30/2019   ALBUMIN 3.9 07/30/2019   CALCIUM 9.0 07/30/2019   ANIONGAP 8 07/30/2019   GFR 99.59 06/05/2019   No results found for: CHOL No results found for: HDL No results found for: LDLCALC No results found for: TRIG No results found for: CHOLHDL No results found for: HGBA1C     Assessment & Plan:   Problem List Items Addressed This Visit      Active Problems   Left ear pain    Since I cannot visualize her ear, It seems this could be a combination of swimmers ear and she has some hearing loss, question possible cerumen impaction as well.  Advised to buy OTC debrox. eRX sent in for cipro dex. Instructed to keep ear dry until better; eardrops per orders, call if persistent pain, swelling or fever, FUV prn.  See my chart message for more details.            I am  having Kennisha L. Carson start on ciprofloxacin-dexamethasone. I am also having her maintain her medroxyPROGESTERone, Cyanocobalamin (VITAMIN B 12 PO), VITAMIN D PO, Selenium, and Synthroid.  Meds ordered this encounter  Medications  . ciprofloxacin-dexamethasone (CIPRODEX) OTIC suspension    Sig: Place 4 drops into the left ear 2 (two) times daily.    Dispense:  7.5 mL    Refill:  0     [name], MD

## 2019-11-28 NOTE — Patient Instructions (Signed)
Otitis Externa  Otitis externa is an infection of the outer ear canal. The outer ear canal is the area between the outside of the ear and the eardrum. Otitis externa is sometimes called swimmer's ear. What are the causes? Common causes of this condition include:  Swimming in dirty water.  Moisture in the ear.  An injury to the inside of the ear.  An object stuck in the ear.  A cut or scrape on the outside of the ear. What increases the risk? You are more likely to develop this condition if you go swimming often. What are the signs or symptoms? The first symptom of this condition is often itching in the ear. Later symptoms of the condition include:  Swelling of the ear.  Redness in the ear.  Ear pain. The pain may get worse when you pull on your ear.  Pus coming from the ear. How is this diagnosed? This condition may be diagnosed by examining the ear and testing fluid from the ear for bacteria and funguses. How is this treated? This condition may be treated with:  Antibiotic ear drops. These are often given for 10-14 days.  Medicines to reduce itching and swelling. Follow these instructions at home:  If you were prescribed antibiotic ear drops, use them as told by your health care provider. Do not stop using the antibiotic even if your condition improves.  Take over-the-counter and prescription medicines only as told by your health care provider.  Avoid getting water in your ears as told by your health care provider. This may include avoiding swimming or water sports for a few days.  Keep all follow-up visits as told by your health care provider. This is important. How is this prevented?  Keep your ears dry. Use the corner of a towel to dry your ears after you swim or bathe.  Avoid scratching or putting things in your ear. Doing these things can damage the ear canal or remove the protective wax that lines it, which makes it easier for bacteria and funguses to  grow.  Avoid swimming in lakes, polluted water, or pools that may not have enough chlorine. Contact a health care provider if:  You have a fever.  Your ear is still red, swollen, painful, or draining pus after 3 days.  Your redness, swelling, or pain gets worse.  You have a severe headache.  You have redness, swelling, pain, or tenderness in the area behind your ear. Summary  Otitis externa is an infection of the outer ear canal.  Common causes include swimming in dirty water, moisture in the ear, or a cut or scrape in the ear.  Symptoms include pain, redness, and swelling of the ear.  If you were prescribed antibiotic ear drops, use them as told by your health care provider. Do not stop using the antibiotic even if your condition improves. This information is not intended to replace advice given to you by your health care provider. Make sure you discuss any questions you have with your health care provider. Document Released: 11/28/2005 Document Revised: 05/04/2018 Document Reviewed: 05/04/2018 Elsevier Patient Education  2020 [employer].

## 2019-11-28 NOTE — Progress Notes (Signed)
Patient ID: Victoria Carson, female   DOB: 03/18/1996, 23 y.o.   MRN: [id number]   Patient location: Home My location: Office Persons participating in the virtual visit: patient, provider  I connected with the patient on 11/28/19 at  9:03 AM EST by a video enabled telemedicine application and verified that I am speaking with the correct person.   I discussed the limitations of evaluation and management by telemedicine and the availability of in person appointments. The patient expressed understanding and agreed to proceed.   Details of the encounter are shown below.  HPI  Victoria Carson is a 23 y.o.-year-old female, initially referred by Alma Friendly, NP, returning for follow-up for Hashimoto's hypothyroidism.  Last visit 4 months ago.   Reviewed and addended history: Patient was diagnosed with goiter in 12/2018 after she noticed that her neck appeared enlarged.  A thyroid ultrasound did not show nodules but showed heterogeneous, enlarged thyroid.  She saw PCP >> was dx'ed hypothyroidism in 01/2019 >> started on Levothyroxine 25 mcg, then increased to 50 mcg in 04/2019, then increased to 75 mcg and switched to Synthroid DAW in 05/2019.  She feels much better on this.  In 07/2019, TSH was still at the upper limit of normal so we increased the dose further. She feels much better on this dose.  Pt is on Synthroid d.a.w. 88 mcg daily, taken: - in am - fasting - at least 30 min from b'fast - no Ca, Fe, MVI, PPIs - not on Biotin On B12 and vit D3.  I reviewed her TFTs: Lab Results  Component Value Date   TSH 2.55 10/25/2019   TSH 4.01 08/08/2019   TSH 4.36 06/05/2019   TSH 5.625 (H) 06/01/2019   TSH 4.05 04/24/2019   TSH 3.64 03/12/2019   TSH 7.42 (H) 01/24/2019   TSH 4.42 07/06/2018   TSH 3.991 01/02/2013   FREET4 0.97 10/25/2019   FREET4 1.09 08/08/2019   FREET4 0.79 01/24/2019   T3FREE 2.8 08/08/2019   Her antithyroid antibodies were elevated: Component     Latest  Ref Rng & Units 01/24/2019  TSI     <140 % baseline <89  Thyroperoxidase Ab SerPl-aCnc     <9 IU/mL >900 (H)   Thyroid U/S (01/25/2019): Markedly enlarged and heterogeneous thyroid gland without evidence of a discrete thyroid nodule.  Her symptoms improved after we increase the dose of her Synthroid.  Shown below our symptoms from last visit compared to now: - + weight gain since approx 12/2018: 27 lbs >> stabilized now - + fatigue >> much better now - + cold intolerance >>  chronic - + depression >> much better now - + constipation - since 10/2018 >> stable now, but saw GI, will see ObGyn to see if her endometriosis may be causing this - + dry skin, changed  from oily skin in 2019 - + irregular menses since menarche - started Depo @ 23 y/o.  She was also told in the past that she may have endometriosis.  Pt denies: - feeling nodules in neck - hoarseness - dysphagia - choking - SOB with lying down  + FH of Hashimoto's thyroiditis 2nd cousin. No FH of thyroid cancer. No h/o radiation tx to head or neck.  No herbal supplements. No Biotin use. No recent steroids use.   Pt. also has a history of chronic bladder pain - has interstitial cystitis - sees urology.  ROS:  Constitutional: + see HPI Eyes: no blurry vision, no xerophthalmia ENT:  no sore throat, + see HPI Cardiovascular: no CP/no SOB/no palpitations/no leg swelling Respiratory: no cough/no SOB/no wheezing Gastrointestinal: no N/no V/no D/+ C/no acid reflux Musculoskeletal: no muscle aches/no joint aches Skin: no rashes, no hair loss, + dry skin Neurological: no tremors/no numbness/no tingling/no dizziness  I reviewed pt's medications, allergies, PMH, social hx, family hx, and changes were documented in the history of present illness. Otherwise, unchanged from my initial visit note.  Past Medical History:  Diagnosis Date  . Chronic cholecystitis 07/2016  . Dental crown present   . Difficulty swallowing pills   . Dust  allergy   . GERD (gastroesophageal reflux disease)    no current med.  . Hypothyroidism (acquired)   . IBS (irritable bowel syndrome)   . Interstitial cystitis 2019  . Migraines    Past Surgical History:  Procedure Laterality Date  . CHOLECYSTECTOMY N/A 08/08/2016   Procedure: LAPAROSCOPIC CHOLECYSTECTOMY;  Surgeon: Coralie Keens, MD;  Location: [facility];  Service: General;  Laterality: N/A;  . WISDOM TOOTH EXTRACTION     Social History   Socioeconomic History  . Marital status: Single    Spouse name: n/a  . Number of children: 0  . Years of education: Not on file  . Highest education level: Not on file  Occupational History  . 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 Financial resource strain: Not on file  . Food insecurity    Worry: Not on file    Inability: Not on file  . Transportation needs    Medical: Not on file    Non-medical: Not on file  Tobacco Use  . Smoking status: Never Smoker  . Smokeless tobacco: Never Used  Substance and Sexual Activity  . Alcohol use: Yes    Comment: occassional: Weekends-2/3  . Drug use: No  . Sexual activity: Never    Partners: Male    Birth control/protection: Inserts  Lifestyle  . Physical activity    Days per week: Not on file    Minutes per session: Not on file  . Stress: Not on file  Relationships  . Social Herbalist on phone: Not on file    Gets together: Not on file    Attends religious service: Not on file    Active member of club or organization: Not on file    Attends meetings of clubs or organizations: Not on file    Relationship status: Not on file  . Intimate partner violence    Fear of current or ex partner: Not on file    Emotionally abused: Not on file    Physically abused: Not on file    Forced sexual activity: Not on file  Other Topics Concern  . Not on file  Social History Narrative   05/01/2013 Victoria Carson was poor in [place], [place]..  She currently lives in [place] with her father, grandfather, older sister, and younger brother. She currently is homeschooled and in the 10th grade. She has a boyfriend of 2 months. 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 2 years ago, she was caught shoplifting. She reports that her social support system consists of a friend. 05/01/2013 Victoria      Single. Lives with father.    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   Enjoys spending time with her friends.               Current Outpatient  Medications on File Prior to Visit  Medication Sig Dispense Refill  . Cyanocobalamin (VITAMIN B 12 PO) Take 1 tablet by mouth daily.    . medroxyPROGESTERone (DEPO-PROVERA) 150 MG/ML injection Inject 150 mg into the muscle every 3 (three) months.    . Selenium 200 MCG CAPS Take 1 capsule by mouth daily.    Marland Kitchen SYNTHROID 100 MCG tablet Take 1 tablet (100 mcg total) by mouth daily before breakfast. 45 tablet 3  . VITAMIN D PO Take 1,000 Units by mouth daily.     No current facility-administered medications on file prior to visit.   Allergies  Allergen Reactions  . Citrus Itching and Other (See Comments)    SWEATING  . Sulfa Antibiotics Nausea Only, Other (See Comments) and Nausea And Vomiting    FEVER   Family History  Problem Relation Age of Onset  . Mental illness Mother   . Drug abuse Mother   . Heart failure Other   . Hypertension Other   . Depression Paternal Aunt   . Anxiety disorder Paternal Aunt   . Stroke Paternal Grandfather   . Liver disease Maternal Grandmother   . Colon polyps Neg Hx   . Esophageal cancer Neg Hx   . Stomach cancer Neg Hx   . Rectal cancer Neg Hx    PE: There were no vitals taken for this visit. Wt Readings from Last 3 Encounters:  11/20/19 159 lb 12.8 oz (72.5 kg)  10/03/19 157 lb (71.2 kg)  09/20/19 157 lb (71.2 kg)   Constitutional:  in NAD  The physical exam was not performed (virtual visit).  ASSESSMENT: 1.  Hypothyroidism   2.  Hashimoto's thyroiditis  3.  Goiter  PLAN:  1. Patient with longstanding hypothyroidism, on levothyroxine generic initially now Synthroid d.a.w. 88 mcg daily.  She felt much better after changing to brand name Synthroid.  At last visit we increased the dose. - She appears euthyroid and tells me that she feels much better after increasing the dose of Synthroid - latest thyroid labs reviewed with pt >> normal after increasing Synthroid dose: Lab Results  Component Value Date   TSH 2.55 10/25/2019  - we discussed about taking the thyroid hormone every day, with water, >30 minutes before breakfast, separated by >4 hours from acid reflux medications, calcium, iron, multivitamins. Pt. is taking it correctly. - will check thyroid tests in 3 months and then have her return to the clinic in 1 year  2.  Hashimoto's thyroiditis -Her TPO antibody level was undetectably high, giving her a diagnosis of Hashimoto's thyroiditis -At the previous visit I recommended 200 mcg selenium daily to help decrease the antibody level. We also discussed about other ways to improve her immune system: Increased rest, decrease stress, good nutrition, exercise -She is now continuing on selenium and at next blood work, will recheck her antibodies to see if they respond to the supplement.  3.  Goiter -She has an enlarged thyroid, most likely related to autoimmune inflammation from Hashimoto's thyroiditis.the thyroid does not contain nodules per most recent thyroid ultrasound -Treating her hypothyroidism may help shrink her thyroid -No neck compression symptoms -We will continue to follow this clinically  Orders Placed This Encounter  Procedures  . xtpit - TSH  . xtpit - free T4  . TPO Ab - thyroid   Philemon Kingdom, MD PhD [facility] End[facility]

## 2019-11-29 LAB — CYTOLOGY - PAP
Chlamydia: NEGATIVE
Comment: NEGATIVE
Comment: NEGATIVE
Comment: NEGATIVE
Comment: NEGATIVE
Comment: NORMAL
Diagnosis: UNDETERMINED — AB
HSV1: NEGATIVE
HSV2: NEGATIVE
High risk HPV: POSITIVE — AB
Neisseria Gonorrhea: NEGATIVE
Trichomonas: NEGATIVE

## 2019-11-30 ENCOUNTER — Encounter: Payer: Self-pay | Admitting: Family Medicine

## 2019-12-09 ENCOUNTER — Other Ambulatory Visit: Payer: Self-pay

## 2019-12-09 ENCOUNTER — Emergency Department (HOSPITAL_BASED_OUTPATIENT_CLINIC_OR_DEPARTMENT_OTHER)
Admission: EM | Admit: 2019-12-09 | Discharge: 2019-12-09 | Disposition: A | Discharge status: Home / Self Care | Payer: Self-pay | Attending: Emergency Medicine | Admitting: Emergency Medicine

## 2019-12-09 ENCOUNTER — Encounter (HOSPITAL_BASED_OUTPATIENT_CLINIC_OR_DEPARTMENT_OTHER): Payer: Self-pay | Admitting: *Deleted

## 2019-12-09 ENCOUNTER — Encounter: Payer: Self-pay | Admitting: Family Medicine

## 2019-12-09 ENCOUNTER — Emergency Department (HOSPITAL_BASED_OUTPATIENT_CLINIC_OR_DEPARTMENT_OTHER): Payer: Self-pay

## 2019-12-09 DIAGNOSIS — Z79899 Other long term (current) drug therapy: Secondary | ICD-10-CM | POA: Insufficient documentation

## 2019-12-09 DIAGNOSIS — N938 Other specified abnormal uterine and vaginal bleeding: Secondary | ICD-10-CM | POA: Insufficient documentation

## 2019-12-09 DIAGNOSIS — Z882 Allergy status to sulfonamides status: Secondary | ICD-10-CM | POA: Insufficient documentation

## 2019-12-09 DIAGNOSIS — R102 Pelvic and perineal pain: Secondary | ICD-10-CM | POA: Insufficient documentation

## 2019-12-09 DIAGNOSIS — N949 Unspecified condition associated with female genital organs and menstrual cycle: Secondary | ICD-10-CM

## 2019-12-09 LAB — BASIC METABOLIC PANEL
Anion gap: 6 (ref 5–15)
BUN: 9 mg/dL (ref 6–20)
CO2: 24 mmol/L (ref 22–32)
Calcium: 8.7 mg/dL — ABNORMAL LOW (ref 8.9–10.3)
Chloride: 111 mmol/L (ref 98–111)
Creatinine, Ser: 0.74 mg/dL (ref 0.44–1.00)
GFR calc Af Amer: >60 mL/min (ref >60)
GFR calc non Af Amer: >60 mL/min (ref >60)
Glucose, Bld: 94 mg/dL (ref 70–99)
Potassium: 3.8 mmol/L (ref 3.5–5.1)
Sodium: 141 mmol/L (ref 135–145)

## 2019-12-09 LAB — CBC WITH DIFFERENTIAL/PLATELET
Abs Immature Granulocytes: 0.01 10*3/uL (ref 0.00–0.07)
Basophils Absolute: 0.1 10*3/uL (ref 0.0–0.1)
Basophils Relative: 1 %
Eosinophils Absolute: 0.2 10*3/uL (ref 0.0–0.5)
Eosinophils Relative: 3 %
HCT: 41.2 % (ref 36.0–46.0)
Hemoglobin: 13.6 g/dL (ref 12.0–15.0)
Immature Granulocytes: 0 %
Lymphocytes Relative: 38 %
Lymphs Abs: 1.8 10*3/uL (ref 0.7–4.0)
MCH: 31.1 pg (ref 26.0–34.0)
MCHC: 33 g/dL (ref 30.0–36.0)
MCV: 94.1 fL (ref 80.0–100.0)
Monocytes Absolute: 0.6 10*3/uL (ref 0.1–1.0)
Monocytes Relative: 13 %
Neutro Abs: 2.1 10*3/uL (ref 1.7–7.7)
Neutrophils Relative %: 45 %
Platelets: 268 10*3/uL (ref 150–400)
RBC: 4.38 MIL/uL (ref 3.87–5.11)
RDW: 12.3 % (ref 11.5–15.5)
WBC: 4.7 10*3/uL (ref 4.0–10.5)
nRBC: 0 % (ref 0.0–0.2)

## 2019-12-09 LAB — URINALYSIS, ROUTINE W REFLEX MICROSCOPIC
Bilirubin Urine: NEGATIVE
Glucose, UA: NEGATIVE mg/dL
Hgb urine dipstick: NEGATIVE
Ketones, ur: NEGATIVE mg/dL
Leukocytes,Ua: NEGATIVE
Nitrite: NEGATIVE
Protein, ur: NEGATIVE mg/dL
Specific Gravity, Urine: 1.02 (ref 1.005–1.030)
pH: 8 (ref 5.0–8.0)

## 2019-12-09 LAB — WET PREP, GENITAL
Clue Cells Wet Prep HPF POC: NONE SEEN
Sperm: NONE SEEN
Trich, Wet Prep: NONE SEEN
Yeast Wet Prep HPF POC: NONE SEEN

## 2019-12-09 LAB — PREGNANCY, URINE: Preg Test, Ur: NEGATIVE

## 2019-12-09 MED ORDER — DOXYCYCLINE CALCIUM 50 MG/5ML PO SYRP: 100.0000 mg | ORAL_SOLUTION | Freq: Two times a day (BID) | ORAL | 0 refills | Status: AC

## 2019-12-09 MED ORDER — DOXYCYCLINE CALCIUM 50 MG/5ML PO SYRP
100.0000 mg | ORAL_SOLUTION | Freq: Two times a day (BID) | ORAL | Status: DC
  Filled 2019-12-09: qty 10

## 2019-12-09 MED ORDER — DOXYCYCLINE HYCLATE 100 MG PO TABS
ORAL_TABLET | ORAL | Status: AC
  Administered 2019-12-09: 100 mg via ORAL
  Filled 2019-12-09: qty 1

## 2019-12-09 MED ORDER — ACETAMINOPHEN 325 MG PO TABS
650.0000 mg | ORAL_TABLET | Freq: Once | ORAL | Status: DC
  Filled 2019-12-09: qty 2

## 2019-12-09 MED ORDER — DOXYCYCLINE HYCLATE 100 MG PO TABS: 100.0000 mg | ORAL_TABLET | Freq: Once | ORAL | Status: AC

## 2019-12-09 MED ORDER — CEFTRIAXONE SODIUM 250 MG IJ SOLR
250.0000 mg | Freq: Once | INTRAMUSCULAR | Status: AC
  Administered 2019-12-09: 250 mg via INTRAMUSCULAR
  Filled 2019-12-09: qty 250

## 2019-12-09 MED ORDER — SODIUM CHLORIDE 0.9 % IV SOLN
100.0000 mg | Freq: Once | INTRAVENOUS | Status: DC
  Filled 2019-12-09: qty 100

## 2019-12-09 MED ORDER — ACETAMINOPHEN 160 MG/5ML PO SOLN
650.0000 mg | Freq: Once | ORAL | Status: AC
  Administered 2019-12-09: 650 mg via ORAL
  Filled 2019-12-09: qty 20.3

## 2019-12-09 NOTE — ED Provider Notes (Signed)
[facility] EMERGENCY DEPARTMENT Provider Note   CSN: [id number] Arrival date & time: 12/09/19  1539     History Chief Complaint  Patient presents with  . Dysmenorrhea    Victoria Carson is a 23 y.o. female.  HPI   Pt is a 23 y/o female with a h/o chronic cholecystitis, GERD, IBS, interstitial cystitis who presents to the ED today for eval of lower pelvic pain began 4 days ago. States pain feels like severe menstrual cramps that  she has had for many years. Pain radiates to her lower back.  Pain is waxing and waning.  Pain seems to be worse with intercourse.  Rates pain at 6/10. She does not have regular menstrual cycles. She states she has had vaginal bleeding for the last 11 days. Initially she was having light bleeding but for the last few days she has been going through about 4 pads per day. She notes she started having vaginal discharge about 1 month ago. She saw her pcp 1 month ago and had a pap and std testing which showed hpv but no other STIs. She tried calling an OBGYN but cannot get an appt til next week. She has not tried any intervention for her symptoms.   Reports associated nausea, but denies fevers, vomiting, diarrhea, constipation, dysuria, frequency, urgency.   Past Medical History:  Diagnosis Date  . Chronic cholecystitis 07/2016  . Dental crown present   . Difficulty swallowing pills   . Dust allergy   . GERD (gastroesophageal reflux disease)    no current med.  . Hypothyroidism (acquired)   . IBS (irritable bowel syndrome)   . Interstitial cystitis 2019  . Migraines     Patient Active Problem List   Diagnosis Date Noted  . Left ear pain 11/28/2019  . Encounter for gynecological examination 11/19/2019  . Dyspareunia in female 11/19/2019  . Vaginal discharge 11/19/2019  . Knee pain, bilateral 10/03/2019  . Change in bowel habits 08/22/2019  . Chest pain 07/31/2019  . Encounter for examination following treatment at hospital 07/31/2019  .  Vitamin D deficiency 07/31/2019  . Lower extremity weakness 06/05/2019  . Palpitations 03/12/2019  . Hypothyroidism due to Hashimoto's thyroiditis 01/24/2019  . Fatigue 07/06/2018  . Acne vulgaris 10/25/2017  . Interstitial cystitis 10/17/2016  . Encounter for birth control 10/17/2016  . MDD (major depressive disorder), recurrent episode, severe ([place]) 02/15/2013  . GAD (generalized anxiety disorder) 02/15/2013    Past Surgical History:  Procedure Laterality Date  . CHOLECYSTECTOMY N/A 08/08/2016   Procedure: LAPAROSCOPIC CHOLECYSTECTOMY;  Surgeon: Coralie Keens, MD;  Location: [facility];  Service: General;  Laterality: N/A;  . WISDOM TOOTH EXTRACTION       OB History   No obstetric history on file.     Family History  Problem Relation Age of Onset  . Mental illness Mother   . Drug abuse Mother   . Heart failure Other   . Hypertension Other   . Depression Paternal Aunt   . Anxiety disorder Paternal Aunt   . Stroke Paternal Grandfather   . Liver disease Maternal Grandmother   . Colon polyps Neg Hx   . Esophageal cancer Neg Hx   . Stomach cancer Neg Hx   . Rectal cancer Neg Hx     Social History   Tobacco Use  . Smoking status: Never Smoker  . Smokeless tobacco: Never Used  Substance Use Topics  . Alcohol use: Yes    Comment: occassional  .  Drug use: No    Home Medications Prior to Admission medications   Medication Sig Start Date End Date Taking? Authorizing Provider  ciprofloxacin-dexamethasone (CIPRODEX) OTIC suspension Place 4 drops into the left ear 2 (two) times daily. 11/28/19   [name], MD  Cyanocobalamin (VITAMIN B 12 PO) Take 1 tablet by mouth daily.    [provider]  doxycycline (VIBRAMYCIN) 50 MG/5ML SYRP Take 10 mLs (100 mg total) by mouth 2 (two) times daily for 14 days. 12/09/19 12/23/19  Letonya Mangels S, PA-C  medroxyPROGESTERone (DEPO-PROVERA) 150 MG/ML injection Inject 150 mg into the muscle every 3 (three)  months.    [provider]  Selenium 200 MCG CAPS Take 1 capsule by mouth daily.    [provider]  SYNTHROID 100 MCG tablet Take 1 tablet (100 mcg total) by mouth daily before breakfast. 09/10/19   Philemon Kingdom, MD  VITAMIN D PO Take 1,000 Units by mouth daily.    [provider]    Allergies    Citrus and Sulfa antibiotics  Review of Systems   Review of Systems  Constitutional: Negative for chills and fever.  HENT: Negative for ear pain and sore throat.   Eyes: Negative for visual disturbance.  Respiratory: Negative for cough and shortness of breath.   Cardiovascular: Negative for chest pain.  Gastrointestinal: Negative for abdominal pain, constipation, diarrhea, nausea and vomiting.  Genitourinary: Positive for pelvic pain. Negative for dysuria and hematuria.  Musculoskeletal: Negative for back pain.  Skin: Negative for rash.  Neurological: Negative for headaches.  All other systems reviewed and are negative.   Physical Exam Updated Vital Signs BP 112/75 (BP Location: Right Arm)   Pulse 66   Temp 99.3 F (37.4 C) (Oral)   Resp 19   Ht 5\' 9"  (1.753 m)   Wt 72.6 kg   LMP 12/09/2019   SpO2 98%   BMI 23.63 kg/m   Physical Exam Vitals and nursing note reviewed.  Constitutional:      General: She is not in acute distress.    Appearance: She is well-developed.  HENT:     Head: Normocephalic and atraumatic.  Eyes:     Conjunctiva/sclera: Conjunctivae normal.  Cardiovascular:     Rate and Rhythm: Normal rate and regular rhythm.     Heart sounds: Normal heart sounds. No murmur.  Pulmonary:     Effort: Pulmonary effort is normal. No respiratory distress.     Breath sounds: Normal breath sounds. No wheezing, rhonchi or rales.  Abdominal:     General: Bowel sounds are normal.     Palpations: Abdomen is soft.     Tenderness: There is abdominal tenderness (suprapubic). There is no guarding or rebound.  Genitourinary:    Comments: Exam  performed by Rodney Booze,  exam chaperoned Date: 12/09/2019 Pelvic exam: normal external genitalia without evidence of trauma. VULVA: normal appearing vulva with no masses, tenderness or lesion. VAGINA: normal appearing vagina with normal color and discharge, no lesions. CERVIX:cervix is erythematous and friable, cervical motion tenderness present, cervical os closed with  mild discharge that is thin white/yellow, Wet prep and DNA probe for chlamydia and GC obtained. ADNEXA: normal adnexa in size, nontender and no masses UTERUS: uterus is normal size, shape, consistency and nontender.   Musculoskeletal:     Cervical back: Neck supple.  Skin:    General: Skin is warm and dry.  Neurological:     Mental Status: She is alert.     ED  Results / Procedures / Treatments   Labs (all labs ordered are listed, but only abnormal results are displayed) Labs Reviewed  WET PREP, GENITAL - Abnormal; Notable for the following components:      Result Value   WBC, Wet Prep HPF POC MANY (*)    All other components within normal limits  BASIC METABOLIC PANEL - Abnormal; Notable for the following components:   Calcium 8.7 (*)    All other components within normal limits  PREGNANCY, URINE  URINALYSIS, ROUTINE W REFLEX MICROSCOPIC  CBC WITH DIFFERENTIAL/PLATELET  HIV ANTIBODY (ROUTINE TESTING W REFLEX)  RPR  GC/CHLAMYDIA PROBE AMP ([place]) NOT AT [facility]    EKG None  Radiology US PELVIC COMPLETE W TRANSVAGINAL AND TORSION R/O  Result Date: 12/09/2019 CLINICAL DATA:  Cervical motion tenderness, uterine tenderness to palpation EXAM: TRANSABDOMINAL AND TRANSVAGINAL ULTRASOUND OF PELVIS DOPPLER ULTRASOUND OF OVARIES TECHNIQUE: Both transabdominal and transvaginal ultrasound examinations of the pelvis were performed. Transabdominal technique was performed for global imaging of the pelvis including uterus, ovaries, adnexal regions, and pelvic cul-de-sac. It was necessary to proceed with  endovaginal exam following the transabdominal exam to visualize the uterus, endometrium, ovaries, and adnexa. Color and duplex Doppler ultrasound was utilized to evaluate blood flow to the ovaries. COMPARISON:  02/25/2015 FINDINGS: Uterus Measurements: 7.9 x 3.9 x 3.8 cm = volume: 61 mL. No fibroids or other mass visualized. Endometrium Thickness: 4 mm.  No focal abnormality visualized. Right ovary Measurements: 3.0 x 2.6 x 2.3 cm = volume: 10 mL. Normal appearance/no adnexal mass. Left ovary Measurements: 3.0 x 2.6 x 2.3 cm = volume: 10 mL. Normal appearance/no adnexal mass. Pulsed Doppler evaluation of both ovaries demonstrates normal low-resistance arterial and venous waveforms. Other findings No abnormal free fluid. IMPRESSION: No ultrasound abnormality of the pelvis to explain pain or tenderness. Electronically Signed   By: Eddie Candle M.D.   On: 12/09/2019 18:32    Procedures Procedures (including critical care time)  Medications Ordered in ED Medications  acetaminophen (TYLENOL) 160 MG/5ML solution 650 mg (650 mg Oral Given 12/09/19 1625)  cefTRIAXone (ROCEPHIN) injection 250 mg (250 mg Intramuscular Given 12/09/19 1911)  doxycycline (VIBRA-TABS) tablet 100 mg (100 mg Oral Given 12/09/19 1924)    ED Course  I have reviewed the triage vital signs and the nursing notes.  Pertinent labs & imaging results that were available during my care of the patient were reviewed by me and considered in my medical decision making (see chart for details).    MDM Rules/Calculators/A&P                     23 year old female presenting for evaluation of pelvic pain.  States she has a long history of severe menstrual cramps and symptoms today are consistent past menstrual cramps however seemed a bit worse and she wanted to be seen for evaluation.  Tried no interventions at home.  Has had vaginal bleeding for 11 days.  No fevers or other GI complaints.  On arrival she is afebrile with normal vital signs.   She has some mild suprapubic tenderness on exam without rebound or guarding.  On pelvic exam she does have cervical motion tenderness.  The cervix is erythematous and friable.  She does have small amount of yellow discharge.  CBC is without leukocytosis or anemia BMP with normal electrolytes and kidney function  Wet prep with WBCs GC/chlamydia, HIV and RPR pending. UA without evidence for infection Urine pregnancy test negative  Ultrasound did  not show any acute abnormalities.  Patient exam is suggestive of PID.  She states she has been diagnosed with this in the past.  She also reports that her PCP thinks she has endometriosis.  I will treat her with ceftriaxone and 2-week course of doxycycline for PID.  I have low suspicion for intra-abdominal cause.  I did discuss the possibility of intra-abdominal cause with the patient however she feels her symptoms are consistent with her prior history of menstrual cramps.  I did give her some strict return precautions that if symptoms persist or worsen she will need to return to the emergency department for further work-up.  She voices understanding of the plan and reasons to return.  All questions answered.  Patient stable for discharge.   Final Clinical Impression(s) / ED Diagnoses Final diagnoses:  Pelvic pain  Dysfunctional uterine bleeding    Rx / DC Orders ED Discharge Orders         Ordered    doxycycline (VIBRAMYCIN) 50 MG/5ML SYRP  2 times daily     12/09/19 1901           Bishop Dublin 12/09/19 2303    Maudie Flakes, MD 12/11/19 [phone]

## 2019-12-09 NOTE — ED Triage Notes (Signed)
Pt reports menstrual cramps x 11 days. States bleeding is moderate-changing pad 3-4x daily. Stopped depo shot 1 month ago.

## 2019-12-09 NOTE — Discharge Instructions (Addendum)
You were given a prescription for antibiotics. Please take the antibiotic prescription fully.   You may alternate taking Tylenol and Ibuprofen as needed for pain control. You may take 400-600 mg of ibuprofen every 6 hours and [phone] mg of Tylenol every 6 hours. Do not exceed 4000 mg of Tylenol daily as this can lead to liver damage. Also, make sure to take Ibuprofen with meals as it can cause an upset stomach. Do not take other NSAIDs while taking Ibuprofen such as (Aleve, Naprosyn, Aspirin, Celebrex, etc) and do not take more than the prescribed dose as this can lead to ulcers and bleeding in your GI tract. You may use warm and cold compresses to help with your symptoms.   Please follow up with your OB-GYN as scheduled in 7-10 days for re-evaluation and further treatment of your symptoms.   Please return to the ER sooner if you have any new or worsening symptoms.

## 2019-12-09 NOTE — ED Notes (Signed)
Pt sts she has "a hard time swallowing pills".  PA is at bedside and sts she will change the order to liquid since we do not have chewable Tylenol here.

## 2019-12-09 NOTE — ED Notes (Signed)
Patient transported to Ultrasound 

## 2019-12-09 NOTE — ED Notes (Signed)
ED Provider at bedside. 

## 2019-12-10 ENCOUNTER — Telehealth: Payer: Self-pay | Admitting: Family Medicine

## 2019-12-10 ENCOUNTER — Encounter: Payer: Self-pay | Admitting: Family Medicine

## 2019-12-10 LAB — RPR: RPR Ser Ql: NONREACTIVE

## 2019-12-10 LAB — HIV ANTIBODY (ROUTINE TESTING W REFLEX): HIV Screen 4th Generation wRfx: NONREACTIVE

## 2019-12-10 NOTE — Telephone Encounter (Signed)
Pt is aware of Dr. Loletha Grayer message.

## 2019-12-10 NOTE — Telephone Encounter (Signed)
Copied from [place] [phone]. Topic: General - Other >> Dec 10, 2019  2:59 PM Keene Breath wrote: Reason for CRM: Patient called to ask the nurse to call regarding some pain she is having in her stomach for 2x days.  No vomitting,  no fever.  Please call to discuss at [phone]

## 2019-12-10 NOTE — Telephone Encounter (Signed)
Reviewed chart and ED notes/recommendations. I would recommend pt take the abx as Rx'd and keep appt with GYN scheduled for early next week.

## 2019-12-10 NOTE — Telephone Encounter (Signed)
Pt stated she is having lower abd pain,worse when move right leg,lower back pain and feel nauseous. 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   Dr. Loletha Grayer please advise in absence of Dr. Deborra Medina today, can this wait until tomorrow?

## 2019-12-11 ENCOUNTER — Encounter: Payer: Self-pay | Admitting: Family Medicine

## 2019-12-11 LAB — GC/CHLAMYDIA PROBE AMP (~~LOC~~) NOT AT ARMC
Chlamydia: NEGATIVE
Neisseria Gonorrhea: NEGATIVE

## 2020-01-02 ENCOUNTER — Encounter: Payer: Self-pay | Admitting: Family Medicine

## 2020-01-06 ENCOUNTER — Other Ambulatory Visit: Payer: Self-pay

## 2020-01-12 ENCOUNTER — Encounter: Payer: Self-pay | Admitting: Family Medicine

## 2020-01-13 NOTE — Telephone Encounter (Signed)
Please see message and advise.  Thank you. Should this patient come in to be seen with the symptoms she is having?

## 2020-01-14 ENCOUNTER — Other Ambulatory Visit: Payer: Self-pay

## 2020-01-14 ENCOUNTER — Telehealth (INDEPENDENT_AMBULATORY_CARE_PROVIDER_SITE_OTHER): Payer: Managed Care, Other (non HMO) | Admitting: Nurse Practitioner

## 2020-01-14 ENCOUNTER — Encounter: Payer: Self-pay | Admitting: Nurse Practitioner

## 2020-01-14 ENCOUNTER — Ambulatory Visit: Payer: Managed Care, Other (non HMO) | Attending: Internal Medicine

## 2020-01-14 VITALS — Ht 69.0 in

## 2020-01-14 DIAGNOSIS — M791 Myalgia, unspecified site: Secondary | ICD-10-CM

## 2020-01-14 DIAGNOSIS — Z20822 Contact with and (suspected) exposure to covid-19: Secondary | ICD-10-CM

## 2020-01-14 DIAGNOSIS — R6889 Other general symptoms and signs: Secondary | ICD-10-CM | POA: Diagnosis not present

## 2020-01-14 NOTE — Patient Instructions (Signed)
Go for COVID test today at 2pm: [place] If negative results, schedule face to face appt for additional evaluation. Use tylenol for bodyaches. Maintain adequate oral hydrate. Go to ED if symptoms worsen.  10 Things You Can Do to Manage Your COVID-19 Symptoms at Home If you have possible or confirmed COVID-19: 1. Stay home from work and school. And stay away from other public places. If you must go out, avoid using any kind of public transportation, ridesharing, or taxis. 2. Monitor your symptoms carefully. If your symptoms get worse, call your healthcare provider immediately. 3. Get rest and stay hydrated. 4. If you have a medical appointment, call the healthcare provider ahead of time and tell them that you have or may have COVID-19. 5. For medical emergencies, call 911 and notify the dispatch personnel that you have or may have COVID-19. 6. Cover your cough and sneezes with a tissue or use the inside of your elbow. 7. Wash your hands often with soap and water for at least 20 seconds or clean your hands with an alcohol-based hand sanitizer that contains at least 60% alcohol. 8. As much as possible, stay in a specific room and away from other people in your home. Also, you should use a separate bathroom, if available. If you need to be around other people in or outside of the home, wear a mask. 9. Avoid sharing personal items with other people in your household, like dishes, towels, and bedding. 10. Clean all surfaces that are touched often, like counters, tabletops, and doorknobs. Use household cleaning sprays or wipes according to the label instructions. [email or web address] 06/12/2019 This information is not intended to replace advice given to you by your health care provider. Make sure you discuss any questions you have with your health care provider. Document Revised: 11/14/2019 Document Reviewed: 11/14/2019 Elsevier Patient Education  [place].

## 2020-01-14 NOTE — Progress Notes (Signed)
Virtual Visit via Video Note  I connected with@ on 01/14/20 at 12:30 PM EST by a video enabled telemedicine application and verified that I am speaking with the correct person using two identifiers.  Location: Patient:Home Provider: Office Participants: patient and provider  I discussed the limitations of evaluation and management by telemedicine and the availability of in person appointments. I also discussed with the patient that there may be a patient responsible charge related to this service. The patient expressed understanding and agreed to proceed.  [id number], diarrhea, body aches.  History of Present Illness: Onset of symptoms on Friday. Fever resolved on Saturday Diarrhea resolved on Sunday Persistent muscle and joint pains. No oral abx in last 55months. No known sick contact or travel. COVID test 01/06/2020 was due to possible exposure.  Fever  This is a new problem. The current episode started in the past 7 days. The problem occurs intermittently. The problem has been resolved. The temperature was taken using an oral thermometer. Associated symptoms include diarrhea, headaches, muscle aches and sleepiness. Pertinent negatives include no abdominal pain, chest pain, congestion, coughing, ear pain, nausea, rash, sore throat, urinary pain, vomiting or wheezing. She has tried nothing for the symptoms.  Risk factors: no contaminated food, no contaminated water, no hx of cancer, no immunosuppression, no occupational exposure, no recent sickness, no recent travel and no sick contacts    Observations/Objective: Physical Exam  Constitutional: She is oriented to person, place, and time. No distress.  Pulmonary/Chest: Effort normal.  Neurological: She is alert and oriented to person, place, and time.  Psychiatric: She has a normal mood and affect. Thought content normal.  Vitals reviewed.  Assessment and Plan: Tashya was seen today for fever.  Diagnoses and all orders for this  visit:  Flu-like symptoms   Follow Up Instructions: See avs   I discussed the assessment and treatment plan with the patient. The patient was provided an opportunity to ask questions and all were answered. The patient agreed with the plan and demonstrated an understanding of the instructions.   The patient was advised to call back or seek an in-person evaluation if the symptoms worsen or if the condition fails to improve as anticipated.  Wilfred Lacy, NP

## 2020-01-15 ENCOUNTER — Telehealth: Payer: Self-pay | Admitting: Family Medicine

## 2020-01-15 LAB — NOVEL CORONAVIRUS, NAA: SARS-CoV-2, NAA: NOT DETECTED

## 2020-01-15 NOTE — Telephone Encounter (Signed)
Patient had a video visit with Baldo Ash on 2/2 and stated that she was to call back and schedule an appointment for labs but no order. Pls advise. [phone]

## 2020-01-15 NOTE — Telephone Encounter (Signed)
Spoke with the pt, pt is negative for Covid 19 as of 01/14/2020--see lab result. Pt wants to come in for more evaluation due to joints and body pain,fingers swelling,abdominal upset,headache,weak---tylenol does not work.   This is Victoria Carson notes from last office :   Go for COVID test today at 2 pm: [place]. If negative results, schedule face to face appt for additional evaluation. Use tylenol for bodyaches.  Pt scheduled with Dr. Loletha Grayer on Friday at 10:30am.  Please see office note  FYI--Dr. Loletha Grayer

## 2020-01-15 NOTE — Telephone Encounter (Signed)
Why is patient following up with me if Dr. Deborra Medina is previously PCP and Baldo Ash saw pt virtually for symptoms? I would prefer pt see Baldo Ash as I have had many new patients in the past weeks with Dr. Zigmund Daniel leaving, Dr. Deborra Medina leaving in 1 week, and Dr. Juleen China leaving [facility]

## 2020-01-16 ENCOUNTER — Ambulatory Visit: Payer: Managed Care, Other (non HMO)

## 2020-01-16 NOTE — Telephone Encounter (Signed)
Charlotte please advise, pt scheduled to see Dr. Loletha Grayer on Friday ( we choose that time because she has to go to work at 1 pm and pt wants to see someone this week). Dr. Deborra Medina is only do virtual visit, Dr. Donald Pore stated pt do not need to be in the office because the test can be false negative.   I'm not sure how to help the pt since you remote on Thursday and Friday.

## 2020-01-16 NOTE — Telephone Encounter (Signed)
If she can not wait till next week to see me, the other option will be to schedule with respiratory clinic or go to urgent care clinic

## 2020-01-16 NOTE — Telephone Encounter (Signed)
Tired to call the pt but unable to leave vm, need to inform that Dr. Loletha Grayer feels its best for the pt to follow up with [facility] but we do not have anything open F2F this week.   Will try again later.

## 2020-01-16 NOTE — Telephone Encounter (Signed)
Pt scheduled with [facility] respiratory clinic today at 5:45 pm. Pt is aware.

## 2020-01-17 ENCOUNTER — Ambulatory Visit: Payer: Managed Care, Other (non HMO) | Admitting: Family Medicine

## 2020-01-20 ENCOUNTER — Telehealth: Payer: Self-pay

## 2020-01-20 NOTE — Telephone Encounter (Signed)
Patient would like to know if she can come in between 01/27/20-01/31/20 due to not feeling well and stated in my-chart:Flare up, possible food intolerances, and other possible autoimmune diseases. I don't feel good, and just really drained.  Flare up, possible food intolerances, and other possible autoimmune diseases. I don't feel good, and just really drained. Please advise if I can use 4:20 on 2/125/21 since there is only 9 in the afternoon on the schedule

## 2020-01-21 NOTE — Telephone Encounter (Signed)
She will need appt with PCP.  However, Labs are in to check her TFTs and thyroid Ab's again if she wants to have a lab visit for this first.

## 2020-01-21 NOTE — Telephone Encounter (Signed)
Please advise if this needs to be addressed by her PCP.

## 2020-01-22 NOTE — Telephone Encounter (Signed)
LM for patient on identified VM that tyroid labs have been ordered and to call for lab visit, for other sx she needs to contact her PCP.

## 2020-01-28 ENCOUNTER — Other Ambulatory Visit: Payer: Managed Care, Other (non HMO)

## 2020-02-19 ENCOUNTER — Emergency Department (HOSPITAL_COMMUNITY)
Admission: EM | Admit: 2020-02-19 | Discharge: 2020-02-19 | Disposition: A | Discharge status: Home / Self Care | Payer: Managed Care, Other (non HMO) | Attending: Emergency Medicine | Admitting: Emergency Medicine

## 2020-02-19 ENCOUNTER — Encounter (HOSPITAL_COMMUNITY): Payer: Self-pay | Admitting: Emergency Medicine

## 2020-02-19 ENCOUNTER — Other Ambulatory Visit: Payer: Self-pay

## 2020-02-19 DIAGNOSIS — T7840XA Allergy, unspecified, initial encounter: Secondary | ICD-10-CM | POA: Insufficient documentation

## 2020-02-19 DIAGNOSIS — Z5321 Procedure and treatment not carried out due to patient leaving prior to being seen by health care provider: Secondary | ICD-10-CM | POA: Insufficient documentation

## 2020-02-19 NOTE — ED Notes (Signed)
Pt approached this tech, asked IV to be removed, stated unable to wait any longer. Pt left the ED with IV being removed.

## 2020-02-19 NOTE — ED Triage Notes (Addendum)
Pt to triage via [facility].  Pt had Cortisone injection at 1:30pm today.  Reports wheezing, itching, SOB, cough, and swelling in throat that started around 3:30pm.  Expiratory wheezing on EMS arrival.  EMS administered 0.3 EPI and Benadryl 50mg  IV.  Pt states she is feeling better now and just has sensation in back of throat.

## 2020-02-20 ENCOUNTER — Ambulatory Visit (INDEPENDENT_AMBULATORY_CARE_PROVIDER_SITE_OTHER): Payer: Managed Care, Other (non HMO) | Admitting: Family Medicine

## 2020-02-20 ENCOUNTER — Encounter: Payer: Self-pay | Admitting: Family Medicine

## 2020-02-20 VITALS — BP 120/78 | Temp 98.3°F | Ht 69.0 in | Wt 165.8 lb

## 2020-02-20 DIAGNOSIS — T7840XA Allergy, unspecified, initial encounter: Secondary | ICD-10-CM

## 2020-02-20 MED ORDER — EPINEPHRINE 0.3 MG/0.3ML IJ SOAJ: 0.3000 mg | INTRAMUSCULAR | 1 refills | Status: DC | PRN

## 2020-02-20 NOTE — Patient Instructions (Signed)
-  referral to allergist -look at blanket materials if you still have and write down -write down food ate at [employer] and take all of this to allergist.  -epi pen sent in and always carry with you. Call 911 if you give this to yourself.   You sound really good today!  Dr. Rogers Blocker

## 2020-02-20 NOTE — Progress Notes (Signed)
Patient: Victoria Carson MRN: [id number] DOB: 01/25/1996 PCP: [name], MD     Subjective:  Chief Complaint  Patient presents with  . Allergic Reaction    unsure source     HPI: The patient is a 24 y.o. female who presents today for allergic reaction. Yes[facility] went to [facility] because her foot was hurting really bad so they gave her a steroid shot for the "inflammation." She went and got [employer] and went home. She also had a new blanket that she layed over. She tried to take a nap but she started to itch all over and then started to cough. She got to the point that she was wheezing and got to where she couldn't breath. Called EMS who said she was wheezing and put her in ambulance. They gave her epi x1 and benadryl. They told her her throat was closing. They took her to the hospital at [facility], but she had such a long wait and was feeling better that she left without being seen and wanted to follow up in primary care. She still feel like her throat is tight, but has never had a stridor. She still feels tight in her lungs. No more coughing or wheezing.   She had eaten same meal at [employer] before. She has had stomach cramps and diarrhea with food before. They threw away the blanket and didn't look at what was in it.   Review of Systems  Constitutional: Positive for fatigue. Negative for chills and fever.  HENT: Positive for ear pain. Negative for congestion, facial swelling, sinus pressure, sinus pain and sneezing.   Eyes: Negative for pain and discharge.  Respiratory: Positive for chest tightness. Negative for cough, shortness of breath and wheezing.   Cardiovascular: Negative for chest pain, palpitations and leg swelling.  Gastrointestinal: Negative for diarrhea, nausea and vomiting.  Genitourinary: Negative for menstrual problem.  Musculoskeletal: Negative for back pain, joint swelling and neck pain.  Neurological: Negative for dizziness, light-headedness and headaches.   Psychiatric/Behavioral: Negative for behavioral problems, hallucinations and suicidal ideas.    Allergies Patient is allergic to citrus and sulfa antibiotics.  Past Medical History Patient  has a past medical history of Chronic cholecystitis (07/2016), Dental crown present, Difficulty swallowing pills, Dust allergy, GERD (gastroesophageal reflux disease), Hypothyroidism (acquired), IBS (irritable bowel syndrome), Interstitial cystitis (2019), and Migraines.  Surgical History Patient  has a past surgical history that includes Wisdom tooth extraction and Cholecystectomy (N/A, 08/08/2016).  Family History [name]'s family history includes Anxiety disorder in her paternal aunt; Depression in her paternal aunt; Drug abuse in her mother; Heart failure in an other family member; Hypertension in an other family member; Liver disease in her maternal grandmother; Mental illness in her mother; Stroke in her paternal grandfather.  Social History Patient  reports that she has never smoked. She has never used smokeless tobacco. She reports current alcohol use. She reports that she does not use drugs.    Objective: Vitals:   02/20/20 1039  BP: 120/78  Temp: 98.3 F (36.8 C)  TempSrc: Temporal  Weight: 165 lb 12.8 oz (75.2 kg)  Height: 5\' 9"  (1.753 m)    Body mass index is 24.48 kg/m.  Physical Exam Vitals reviewed.  Constitutional:      General: She is not in acute distress.    Appearance: Normal appearance. She is normal weight. She is not ill-appearing or toxic-appearing.  HENT:     Head: Normocephalic and atraumatic.     Right Ear:  Tympanic membrane, ear canal and external ear normal.     Left Ear: Tympanic membrane, ear canal and external ear normal.     Nose: Nose normal.     Mouth/Throat:     Mouth: Mucous membranes are moist.  Eyes:     Extraocular Movements: Extraocular movements intact.     Conjunctiva/sclera: Conjunctivae normal.     Pupils: Pupils are equal, round, and  reactive to light.  Neck:     Comments: +right sided goiter  Cardiovascular:     Rate and Rhythm: Normal rate and regular rhythm.     Heart sounds: Normal heart sounds.  Pulmonary:     Effort: Pulmonary effort is normal. No respiratory distress.     Breath sounds: Normal breath sounds. No stridor. No wheezing.  Abdominal:     General: Abdomen is flat. Bowel sounds are normal.     Palpations: Abdomen is soft.  Musculoskeletal:     Cervical back: Neck supple.  Skin:    General: Skin is warm.     Capillary Refill: Capillary refill takes less than 2 seconds.  Neurological:     General: No focal deficit present.     Mental Status: She is alert and oriented to person, place, and time.  Psychiatric:        Mood and Affect: Mood normal.        Behavior: Behavior normal.        Assessment/plan: 1. Allergic reaction, initial encounter Asked that she have her fiance write down all material in blanket and write down food she ate at [employer]. Can take to allergist and of course avoid. Epi pen px given and advised to call 911 if she has to administer. I do not feel like this was the steroid since reaction happened over an hour later. Referral to allergist placed. Clinically she is back to baseline.    This visit occurred during the SARS-CoV-2 public health emergency.  Safety protocols were in place, including screening questions prior to the visit, additional usage of staff PPE, and extensive cleaning of exam room while observing appropriate contact time as indicated for disinfecting solutions.     Return if symptoms worsen or fail to improve.   Orma Flaming, MD [place]   02/20/2020

## 2020-02-25 NOTE — Progress Notes (Deleted)
Victoria Carson is a 24 y.o. female is here to for transfer of care.  I acted as a Education administrator for [employer], PA-C [employer], LPN  History of Present Illness:   No chief complaint on file.   HPI  Pt is here for transfer of care from Dr. Marjory Lies  There are no preventive care reminders to display for this patient.  Past Medical History:  Diagnosis Date  . Chronic cholecystitis 07/2016  . Dental crown present   . Difficulty swallowing pills   . Dust allergy   . GERD (gastroesophageal reflux disease)    no current med.  . Hypothyroidism (acquired)   . IBS (irritable bowel syndrome)   . Interstitial cystitis 2019  . Migraines      Social History   Socioeconomic History  . Marital status: Single    Spouse name: n/a  . Number of children: 0  . Years of education: Not on file  . Highest education level: Not on file  Occupational History  . 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 Smoking status: Never Smoker  . Smokeless tobacco: Never Used  Substance and Sexual Activity  . Alcohol use: Yes    Comment: occassional  . Drug use: No  . Sexual activity: Not on file  Other Topics Concern  . Not on file  Social History Narrative   05/01/2013 AHW Katina was poor in [place], [place].. She currently lives in [place] with her father, grandfather, older sister, and younger brother. She currently is homeschooled and in the 10th grade. She has a boyfriend of 2 months. 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 2 years ago, she was caught shoplifting. She reports that her social support system consists of a friend. 05/01/2013 AHW      Single. Lives with father.    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   Enjoys spending time with her friends.               Social Determinants of Health   Financial Resource Strain:   . Difficulty of Paying Living Expenses:   Food Insecurity:   . Worried  About Charity fundraiser in the Last Year:   . Arboriculturist in the Last Year:   Transportation Needs:   . Film/video editor (Medical):   Marland Kitchen Lack of Transportation (Non-Medical):   Physical Activity:   . Days of Exercise per Week:   . Minutes of Exercise per Session:   Stress:   . Feeling of Stress :   Social Connections:   . Frequency of Communication with Friends and Family:   . Frequency of Social Gatherings with Friends and Family:   . Attends Religious Services:   . Active Member of Clubs or Organizations:   . Attends Archivist Meetings:   Marland Kitchen Marital Status:   Intimate Partner Violence:   . Fear of Current or Ex-Partner:   . Emotionally Abused:   Marland Kitchen Physically Abused:   . Sexually Abused:     Past Surgical History:  Procedure Laterality Date  . CHOLECYSTECTOMY N/A 08/08/2016   Procedure: LAPAROSCOPIC CHOLECYSTECTOMY;  Surgeon: Coralie Keens, MD;  Location: [place];  Service: General;  Laterality: N/A;  . WISDOM TOOTH EXTRACTION      Family History  Problem Relation Age of Onset  . Mental illness Mother   . Drug abuse Mother   .  Heart failure Other   . Hypertension Other   . Depression Paternal Aunt   . Anxiety disorder Paternal Aunt   . Stroke Paternal Grandfather   . Liver disease Maternal Grandmother   . Colon polyps Neg Hx   . Esophageal cancer Neg Hx   . Stomach cancer Neg Hx   . Rectal cancer Neg Hx     PMHx, SurgHx, SocialHx, FamHx, Medications, and Allergies were reviewed in the Visit Navigator and updated as appropriate.   Patient Active Problem List   Diagnosis Date Noted  . Dyspareunia in female 11/19/2019  . Knee pain, bilateral 10/03/2019  . Change in bowel habits 08/22/2019  . Chest pain 07/31/2019  . Vitamin D deficiency 07/31/2019  . Lower extremity weakness 06/05/2019  . Palpitations 03/12/2019  . Hypothyroidism due to Hashimoto's thyroiditis 01/24/2019  . Fatigue 07/06/2018  . Acne vulgaris  10/25/2017  . Interstitial cystitis 10/17/2016  . Encounter for birth control 10/17/2016  . MDD (major depressive disorder), recurrent episode, severe ([place]) 02/15/2013  . GAD (generalized anxiety disorder) 02/15/2013    Social History   Tobacco Use  . Smoking status: Never Smoker  . Smokeless tobacco: Never Used  Substance Use Topics  . Alcohol use: Yes    Comment: occassional  . Drug use: No    Current Medications and Allergies:    Current Outpatient Medications:  .  Cyanocobalamin (VITAMIN B 12 PO), Take 1 tablet by mouth daily., Disp: , Rfl:  .  EPINEPHrine 0.3 mg/0.3 mL IJ SOAJ injection, Inject 0.3 mLs (0.3 mg total) into the muscle as needed for anaphylaxis., Disp: 2 each, Rfl: 1 .  norelgestromin-ethinyl estradiol (ORTHO EVRA) 150-35 MCG/24HR transdermal patch, Place 1 patch onto the skin once a week., Disp: , Rfl:  .  Selenium 200 MCG CAPS, Take 1 capsule by mouth daily., Disp: , Rfl:  .  SYNTHROID 100 MCG tablet, Take 1 tablet (100 mcg total) by mouth daily before breakfast., Disp: 45 tablet, Rfl: 3 .  VITAMIN D PO, Take 1,000 Units by mouth daily., Disp: , Rfl:   Allergies  Allergen Reactions  . Citrus Itching and Other (See Comments)    SWEATING  . Sulfa Antibiotics Nausea Only, Other (See Comments) and Nausea And Vomiting    FEVER    Review of Systems   ROS  Vitals:  There were no vitals filed for this visit.   There is no height or weight on file to calculate BMI.   Physical Exam:    Physical Exam   Assessment and Plan:    There are no diagnoses linked to this encounter.  . Reviewed expectations re: course of current medical issues. . Discussed self-management of symptoms. . Outlined signs and symptoms indicating need for more acute intervention. . Patient verbalized understanding and all questions were answered. . See orders for this visit as documented in the electronic medical record. . Patient received an After Visit  Summary.  ***  Inda Coke, PA-C [place] [place] 02/25/2020  Follow-up: No follow-ups on file.

## 2020-02-26 ENCOUNTER — Encounter: Payer: Managed Care, Other (non HMO) | Admitting: Physician Assistant

## 2020-03-13 ENCOUNTER — Encounter: Payer: Self-pay | Admitting: Physician Assistant

## 2020-03-13 ENCOUNTER — Other Ambulatory Visit: Payer: Self-pay | Admitting: Internal Medicine

## 2020-03-16 ENCOUNTER — Ambulatory Visit (INDEPENDENT_AMBULATORY_CARE_PROVIDER_SITE_OTHER): Payer: BC Managed Care – PPO | Admitting: Physician Assistant

## 2020-03-16 ENCOUNTER — Encounter: Payer: Self-pay | Admitting: Physician Assistant

## 2020-03-16 ENCOUNTER — Other Ambulatory Visit: Payer: Self-pay

## 2020-03-16 VITALS — BP 118/80 | HR 67 | Temp 98.2°F | Ht 69.0 in | Wt 161.0 lb

## 2020-03-16 DIAGNOSIS — E038 Other specified hypothyroidism: Secondary | ICD-10-CM | POA: Diagnosis not present

## 2020-03-16 DIAGNOSIS — E063 Autoimmune thyroiditis: Secondary | ICD-10-CM | POA: Diagnosis not present

## 2020-03-16 DIAGNOSIS — M2550 Pain in unspecified joint: Secondary | ICD-10-CM | POA: Diagnosis not present

## 2020-03-16 DIAGNOSIS — R220 Localized swelling, mass and lump, head: Secondary | ICD-10-CM

## 2020-03-16 DIAGNOSIS — R2241 Localized swelling, mass and lump, right lower limb: Secondary | ICD-10-CM | POA: Diagnosis not present

## 2020-03-16 NOTE — Telephone Encounter (Signed)
Left detailed message on personal voicemail, that Aldona Bar would like to know if you were seen over the weekend for issue if not she would like you to make an appt today to be seen. Please call office.

## 2020-03-16 NOTE — Progress Notes (Signed)
Victoria Carson is a 24 y.o. female here for a new problem.  I acted as a Education administrator for [employer], PA-C Anselmo Pickler, LPN  History of Present Illness:   Chief Complaint  Patient presents with  . Foot Pain    HPI   Foot pain Pt c/o right foot pain x 3 weeks. Pt has planter fascitis and bursitis in this foot but the past 3 weeks, has had pain top of foot. She is also having lower leg and ankle swelling, with the pain radiating to her toes. Pt had cortisone shot on 02/19/2020 and had anaphylaxis reaction 2 hours after injection. Not sure if is was shot pt has an appt with allergist on 03/27/2020. Cortisone shot did not help. Pt has not taken anything for pain. Denies: numbness/tingling to R foot, palpitations, SOB, calf swelling.  Hypothyroidism Has not taken her medication in the past two weeks. Wasn't able to tolerate Levothyroxine because it caused her to have reaction of feeling sick. She states that she is non-compliant with this medication and feels like she was initially in denial about this.  Facial swelling Wakes up in the morning and has swelling in the morning around her upper lip and chin area and in bilateral fingers. Seeing an allergist in a few weeks.  Past Medical History:  Diagnosis Date  . Chronic cholecystitis 07/2016  . Dental crown present   . Difficulty swallowing pills   . Dust allergy   . GERD (gastroesophageal reflux disease)    no current med.  . Hypothyroidism (acquired)   . IBS (irritable bowel syndrome)   . Interstitial cystitis 2019  . Migraines      Social History   Socioeconomic History  . Marital status: Single    Spouse name: n/a  . Number of children: 0  . Years of education: Not on file  . Highest education level: Not on file  Occupational History  . 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 Smoking status: Never Smoker  . Smokeless tobacco: Never Used  Substance and Sexual Activity  . Alcohol  use: Yes    Comment: occassional  . Drug use: No  . Sexual activity: Not on file  Other Topics Concern  . Not on file  Social History Narrative   05/01/2013 AHW Eriko was poor in [place], [place].. She currently lives in [place] with her father, grandfather, older sister, and younger brother. She currently is homeschooled and in the 10th grade. She has a boyfriend of 2 months. 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 2 years ago, she was caught shoplifting. She reports that her social support system consists of a friend. 05/01/2013 AHW      Single. Lives with father.    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   Enjoys spending time with her friends.               Social Determinants of Health   Financial Resource Strain:   . Difficulty of Paying Living Expenses:   Food Insecurity:   . Worried About Charity fundraiser in the Last Year:   . Arboriculturist in the Last Year:   Transportation Needs:   . Film/video editor (Medical):   Marland Kitchen Lack of Transportation (Non-Medical):   Physical Activity:   . Days of Exercise per Week:   . Minutes of Exercise per Session:   Stress:   .  Feeling of Stress :   Social Connections:   . Frequency of Communication with Friends and Family:   . Frequency of Social Gatherings with Friends and Family:   . Attends Religious Services:   . Active Member of Clubs or Organizations:   . Attends Archivist Meetings:   Marland Kitchen Marital Status:   Intimate Partner Violence:   . Fear of Current or Ex-Partner:   . Emotionally Abused:   Marland Kitchen Physically Abused:   . Sexually Abused:     Past Surgical History:  Procedure Laterality Date  . CHOLECYSTECTOMY N/A 08/08/2016   Procedure: LAPAROSCOPIC CHOLECYSTECTOMY;  Surgeon: Coralie Keens, MD;  Location: [facility];  Service: General;  Laterality: N/A;  . WISDOM TOOTH EXTRACTION      Family History  Problem Relation Age of Onset  . Mental  illness Mother   . Drug abuse Mother   . Heart failure Other   . Hypertension Other   . Depression Paternal Aunt   . Anxiety disorder Paternal Aunt   . Stroke Paternal Grandfather   . Liver disease Maternal Grandmother   . Colon polyps Neg Hx   . Esophageal cancer Neg Hx   . Stomach cancer Neg Hx   . Rectal cancer Neg Hx     Allergies  Allergen Reactions  . Citrus Itching and Other (See Comments)    SWEATING  . Sulfa Antibiotics Nausea Only, Other (See Comments) and Nausea And Vomiting    FEVER    Current Medications:   Current Outpatient Medications:  .  Cyanocobalamin (VITAMIN B 12 PO), Take 1 tablet by mouth daily., Disp: , Rfl:  .  EPINEPHrine 0.3 mg/0.3 mL IJ SOAJ injection, Inject 0.3 mLs (0.3 mg total) into the muscle as needed for anaphylaxis., Disp: 2 each, Rfl: 1 .  norelgestromin-ethinyl estradiol (ORTHO EVRA) 150-35 MCG/24HR transdermal patch, Place 1 patch onto the skin once a week., Disp: , Rfl:  .  Selenium 200 MCG CAPS, Take 1 capsule by mouth daily., Disp: , Rfl:  .  SYNTHROID 100 MCG tablet, Take 1 tablet (100 mcg total) by mouth daily before breakfast., Disp: 45 tablet, Rfl: 3 .  VITAMIN D PO, Take 1,000 Units by mouth daily., Disp: , Rfl:    Review of Systems:   ROS  Negative unless otherwise specified per HPI.  Vitals:   Vitals:   03/16/20 1411  BP: 118/80  Pulse: 67  Temp: 98.2 F (36.8 C)  TempSrc: Temporal  SpO2: 97%  Weight: 161 lb (73 kg)  Height: 5\' 9"  (1.753 m)     Body mass index is 23.78 kg/m.  Physical Exam:   Physical Exam Vitals and nursing note reviewed.  Constitutional:      General: She is not in acute distress.    Appearance: She is well-developed. She is not ill-appearing or toxic-appearing.  HENT:     Head:     Comments: No facial swelling evident on today's exam Cardiovascular:     Rate and Rhythm: Normal rate and regular rhythm.     Pulses: Normal pulses.          Dorsalis pedis pulses are 2+ on the right side  and 2+ on the left side.       Posterior tibial pulses are 2+ on the right side and 2+ on the left side.     Heart sounds: Normal heart sounds, S1 normal and S2 normal.  Pulmonary:     Effort: Pulmonary effort is normal.  Breath sounds: Normal breath sounds.  Musculoskeletal:     Right lower leg: 1+ Edema present.     Left lower leg: No edema.     Comments: TTP to top of R foot at mid foot; no obvious swelling of foot or decreased ROM. No TTP to bilateral calves. Negative Homan's sign b/l.  Skin:    General: Skin is warm and dry.  Neurological:     Mental Status: She is alert.     GCS: GCS eye subscore is 4. GCS verbal subscore is 5. GCS motor subscore is 6.  Psychiatric:        Speech: Speech normal.        Behavior: Behavior normal. Behavior is cooperative.     Assessment and Plan:   Kana was seen today for foot pain.  Diagnoses and all orders for this visit:  Hypothyroidism due to Hashimoto's thyroiditis Will update labs today. She is non-compliant with medication and we discussed need to improve this. Will refer labs back to endocrinology for evaluation. -     T4, free -     TSH  Arthralgia, unspecified joint Patient requesting work-up for RA. Will update labs and refer to rheumatology if appropriate. -     CBC with Differential/Platelet -     Comprehensive metabolic panel -     Rheumatoid factor -     ANA -     Sedimentation rate -     C-reactive protein -     Ambulatory referral to Sports Medicine  Localized swelling of right foot Referral to sports medicine. No red flags on exam. Wells Score 0. Recommend ibuprofen and cool water soaks. -     CBC with Differential/Platelet -     Comprehensive metabolic panel -     Ambulatory referral to Sports Medicine  Facial swelling Encouraged compliance with medications, further work-up based upon lab results.   . Reviewed expectations re: course of current medical issues. . Discussed self-management of  symptoms. . Outlined signs and symptoms indicating need for more acute intervention. . Patient verbalized understanding and all questions were answered. . See orders for this visit as documented in the electronic medical record. . Patient received an After-Visit Summary.  CMA or LPN served as scribe during this visit. History, Physical, and Plan performed by medical provider. The above documentation has been reviewed and is accurate and complete.  Inda Coke, PA-C

## 2020-03-16 NOTE — Patient Instructions (Signed)
It was great to see you!  We will be in touch with your lab results.  Trial ibuprofen for your foot.  A referral has been placed for you to see Dr. Lynne Leader with [facility]. Someone from there office will be in touch soon regarding your appointment with him. His location: [place] at [facility] [address] on the 1st floor.   Phone number [phone], Fax [phone].  This location is across the street from the entrance to [employer] and in the same complex as the [facility] and [employer] bank   Take care,  Inda Coke PA-C

## 2020-03-16 NOTE — Telephone Encounter (Signed)
Pt is transferring care to you.

## 2020-03-17 LAB — COMPREHENSIVE METABOLIC PANEL
ALT: 9 U/L (ref 0–35)
AST: 11 U/L (ref 0–37)
Albumin: 4.1 g/dL (ref 3.5–5.2)
Alkaline Phosphatase: 99 U/L (ref 39–117)
BUN: 8 mg/dL (ref 6–23)
CO2: 28 mEq/L (ref 19–32)
Calcium: 9.4 mg/dL (ref 8.4–10.5)
Chloride: 106 mEq/L (ref 96–112)
Creatinine, Ser: 0.76 mg/dL (ref 0.40–1.20)
GFR: 94.4 mL/min (ref >60.00)
Glucose, Bld: 91 mg/dL (ref 70–99)
Potassium: 4.5 mEq/L (ref 3.5–5.1)
Sodium: 140 mEq/L (ref 135–145)
Total Bilirubin: 0.4 mg/dL (ref 0.2–1.2)
Total Protein: 6.4 g/dL (ref 6.0–8.3)

## 2020-03-17 LAB — CBC WITH DIFFERENTIAL/PLATELET
Basophils Absolute: 0 10*3/uL (ref 0.0–0.1)
Basophils Relative: 0.9 % (ref 0.0–3.0)
Eosinophils Absolute: 0.1 10*3/uL (ref 0.0–0.7)
Eosinophils Relative: 2.1 % (ref 0.0–5.0)
HCT: 39.2 % (ref 36.0–46.0)
Hemoglobin: 13.3 g/dL (ref 12.0–15.0)
Lymphocytes Relative: 30.6 % (ref 12.0–46.0)
Lymphs Abs: 1.6 10*3/uL (ref 0.7–4.0)
MCHC: 33.9 g/dL (ref 30.0–36.0)
MCV: 92.9 fl (ref 78.0–100.0)
Monocytes Absolute: 0.5 10*3/uL (ref 0.1–1.0)
Monocytes Relative: 10.6 % (ref 3.0–12.0)
Neutro Abs: 2.9 10*3/uL (ref 1.4–7.7)
Neutrophils Relative %: 55.8 % (ref 43.0–77.0)
Platelets: 250 10*3/uL (ref 150.0–400.0)
RBC: 4.22 Mil/uL (ref 3.87–5.11)
RDW: 12.4 % (ref 11.5–15.5)
WBC: 5.1 10*3/uL (ref 4.0–10.5)

## 2020-03-17 LAB — T4, FREE: Free T4: 0.7 ng/dL (ref 0.60–1.60)

## 2020-03-17 LAB — SEDIMENTATION RATE: Sed Rate: 3 mm/hr (ref 0–20)

## 2020-03-17 LAB — C-REACTIVE PROTEIN: CRP: <1 mg/dL (ref 0.5–20.0)

## 2020-03-17 LAB — TSH: TSH: 3.71 u[IU]/mL (ref 0.35–4.50)

## 2020-03-18 LAB — ANTI-NUCLEAR AB-TITER (ANA TITER): ANA Titer 1: 1:80 {titer} — ABNORMAL HIGH

## 2020-03-18 LAB — RHEUMATOID FACTOR: Rheumatoid fact SerPl-aCnc: <14 IU/mL (ref <14)

## 2020-03-18 LAB — ANA: Anti Nuclear Antibody (ANA): POSITIVE — AB

## 2020-03-20 ENCOUNTER — Encounter: Payer: Managed Care, Other (non HMO) | Admitting: Physician Assistant

## 2020-03-20 ENCOUNTER — Other Ambulatory Visit: Payer: Self-pay | Admitting: Physician Assistant

## 2020-03-20 ENCOUNTER — Encounter: Payer: Self-pay | Admitting: Physician Assistant

## 2020-03-20 DIAGNOSIS — R768 Other specified abnormal immunological findings in serum: Secondary | ICD-10-CM

## 2020-03-20 DIAGNOSIS — M2550 Pain in unspecified joint: Secondary | ICD-10-CM

## 2020-03-23 ENCOUNTER — Other Ambulatory Visit: Payer: Self-pay | Admitting: Physician Assistant

## 2020-03-23 DIAGNOSIS — R220 Localized swelling, mass and lump, head: Secondary | ICD-10-CM

## 2020-03-26 ENCOUNTER — Ambulatory Visit: Payer: Self-pay

## 2020-03-26 ENCOUNTER — Ambulatory Visit (INDEPENDENT_AMBULATORY_CARE_PROVIDER_SITE_OTHER): Payer: BC Managed Care – PPO

## 2020-03-26 ENCOUNTER — Other Ambulatory Visit: Payer: Self-pay

## 2020-03-26 ENCOUNTER — Ambulatory Visit: Payer: BC Managed Care – PPO | Admitting: Family Medicine

## 2020-03-26 VITALS — BP 104/70 | HR 87 | Ht 69.0 in | Wt 159.4 lb

## 2020-03-26 DIAGNOSIS — M79671 Pain in right foot: Secondary | ICD-10-CM

## 2020-03-26 NOTE — Progress Notes (Signed)
Subjective:    I'm seeing this patient as a consultation for:  Victoria Carson, [place]. Note will be routed back to referring provider/PCP.  CC: R foot pain  I, Molly Weber, LAT, ATC, am serving as scribe for Dr. Lynne Leader.  HPI: Pt is a 24 y/o female presenting w/ c/o R foot pain worsening over the last 3-4 weeks.  Pt has hx of plantar fasciitis and bursitis in her R foot but is now having pain on the top of her foot.  Additionally she is having R lower leg and ankle swelling w/ pain radiating into her toes.  She rates her pain as mild and describes her pain as deep.  She denies any change in activity or injury.  Patient did have rheumatologic work-up by PCP.  She did have positive ANA with titer 1: 80.  She was referred to rheumatology but does not schedule an appointment yet as referral was placed a few days ago.  Radiating pain: yes into the lateral ankle and Achille's area R foot swelling: Yes at the top of the R foot; did have some in her R ankle but this has resolved Aggravating factors: transitioning from an elevated foot position to a dependent position; walking Treatments tried: boot; 02/19/20 had a intermuscular cortisone injection but had an adverse reaction.  She notes boot has been uncomfortable on her shin but does help her foot pain.  Past medical history, Surgical history, Family history, Social history, Allergies, and medications have been entered into the medical record, reviewed.   Review of Systems: No new headache, visual changes, nausea, vomiting, diarrhea, constipation, dizziness, abdominal pain, skin rash, fevers, chills, night sweats, weight loss, swollen lymph nodes, body aches, joint swelling, muscle aches, chest pain, shortness of breath, mood changes, visual or auditory hallucinations.   Objective:    Vitals:   03/26/20 1448  BP: 104/70  Pulse: 87  SpO2: 97%   General: Well Developed, well nourished, and in no acute distress.  Neuro/Psych: Alert and oriented x3,  extra-ocular muscles intact, able to move all 4 extremities, sensation grossly intact. Skin: Warm and dry, no rashes noted.  Respiratory: Not using accessory muscles, speaking in full sentences, trachea midline.  Cardiovascular: Pulses palpable, no extremity edema. Abdomen: Does not appear distended. MSK: Foot and ankle normal-appearing except for swelling at dorsal forefoot around metatarsals 2 and 3.  No visible change elsewhere. Normal foot and ankle motion. Tender palpation dorsal forefoot and dorsal lateral forefoot to midfoot. Stable ligamentous exam. Intact strength. Pulses cap refill and sensation are intact distally.  Lab and Radiology Results X-ray images right foot obtained today personally and independently reviewed. Callus formation distal shaft 2nd metatarsal concerning for stress fracture.  Await formal radiology review.  Recent Results (from the past 2160 hour(s))  Novel Coronavirus, NAA ([facility])     Status: None   Collection Time: 01/14/20  1:51 PM   Specimen: Nasopharyngeal(NP) swabs in vial transport medium   NASOPHARYNGE  TESTING  Result Value Ref Range   SARS-CoV-2, NAA Not Detected Not Detected    Comment: This nucleic acid amplification test was developed and its performance characteristics determined by [employer], [employer] and Company. Nucleic acid amplification tests include RT-PCR and TMA. This test has not been FDA cleared or approved. This test has been authorized by FDA under an Emergency Use Authorization (EUA). This test is only authorized for the duration of time the declaration that circumstances exist justifying the authorization of the emergency use of in vitro diagnostic tests for  detection of SARS-CoV-2 virus and/or diagnosis of COVID-19 infection under section 564(b)(1) of the Act, 21 U.S.C. PT:[id number]) (1), unless the authorization is terminated or revoked sooner. When diagnostic testing is negative, the possibility of a false negative result should be  considered in the context of a patient's recent exposures and the presence of clinical signs and symptoms consistent with COVID-19. An individual without symptoms of COVID-19 and who is not shedding SARS-CoV-2 virus wo uld expect to have a negative (not detected) result in this assay.   T4, free     Status: None   Collection Time: 03/16/20  2:51 PM  Result Value Ref Range   Free T4 0.70 0.60 - 1.60 ng/dL    Comment: Specimens from patients who are undergoing biotin therapy and /or ingesting biotin supplements may contain high levels of biotin.  The higher biotin concentration in these specimens interferes with this Free T4 assay.  Specimens that contain high levels  of biotin may cause false high results for this Free T4 assay.  Please interpret results in light of the total clinical presentation of the patient.    TSH     Status: None   Collection Time: 03/16/20  2:51 PM  Result Value Ref Range   TSH 3.71 0.35 - 4.50 uIU/mL  CBC with Differential/Platelet     Status: None   Collection Time: 03/16/20  2:51 PM  Result Value Ref Range   WBC 5.1 4.0 - 10.5 K/uL   RBC 4.22 3.87 - 5.11 Mil/uL   Hemoglobin 13.3 12.0 - 15.0 g/dL   HCT 39.2 36.0 - 46.0 %   MCV 92.9 78.0 - 100.0 fl   MCHC 33.9 30.0 - 36.0 g/dL   RDW 12.4 11.5 - 15.5 %   Platelets 250.0 150.0 - 400.0 K/uL   Neutrophils Relative % 55.8 43.0 - 77.0 %   Lymphocytes Relative 30.6 12.0 - 46.0 %   Monocytes Relative 10.6 3.0 - 12.0 %   Eosinophils Relative 2.1 0.0 - 5.0 %   Basophils Relative 0.9 0.0 - 3.0 %   Neutro Abs 2.9 1.4 - 7.7 K/uL   Lymphs Abs 1.6 0.7 - 4.0 K/uL   Monocytes Absolute 0.5 0.1 - 1.0 K/uL   Eosinophils Absolute 0.1 0.0 - 0.7 K/uL   Basophils Absolute 0.0 0.0 - 0.1 K/uL  Comprehensive metabolic panel     Status: None   Collection Time: 03/16/20  2:51 PM  Result Value Ref Range   Sodium 140 135 - 145 mEq/L   Potassium 4.5 3.5 - 5.1 mEq/L   Chloride 106 96 - 112 mEq/L   CO2 28 19 - 32 mEq/L   Glucose,  Bld 91 70 - 99 mg/dL   BUN 8 6 - 23 mg/dL   Creatinine, Ser 0.76 0.40 - 1.20 mg/dL   Total Bilirubin 0.4 0.2 - 1.2 mg/dL   Alkaline Phosphatase 99 39 - 117 U/L   AST 11 0 - 37 U/L   ALT 9 0 - 35 U/L   Total Protein 6.4 6.0 - 8.3 g/dL   Albumin 4.1 3.5 - 5.2 g/dL   GFR 94.40 >60.00 mL/min   Calcium 9.4 8.4 - 10.5 mg/dL  Rheumatoid factor     Status: None   Collection Time: 03/16/20  2:51 PM  Result Value Ref Range   Rhuematoid fact SerPl-aCnc <14 <14 IU/mL  ANA     Status: Abnormal   Collection Time: 03/16/20  2:51 PM  Result Value Ref Range   Anti  Nuclear Antibody (ANA) POSITIVE (A) NEGATIVE    Comment: ANA IFA is a first line screen for detecting the presence of up to approximately 150 autoantibodies in various autoimmune diseases. A positive ANA IFA result is suggestive of autoimmune disease and reflexes to titer and pattern. Further laboratory testing may be considered if clinically indicated. . For additional information, please refer to http://education.[facility].com/faq/FAQ177 (This link is being provided for informational/ educational purposes only.) .   Sedimentation rate     Status: None   Collection Time: 03/16/20  2:51 PM  Result Value Ref Range   Sed Rate 3 0 - 20 mm/hr  C-reactive protein     Status: None   Collection Time: 03/16/20  2:51 PM  Result Value Ref Range   CRP <1.0 0.5 - 20.0 mg/dL  Anti-nuclear ab-titer (ANA titer)     Status: Abnormal   Collection Time: 03/16/20  2:51 PM  Result Value Ref Range   ANA Titer 1 1:80 (H) titer    Comment: A low level ANA titer may be present in pre-clinical autoimmune diseases and normal individuals.                 Reference Range                 <1:40        Negative                 1:40-1:80    Low Antibody Level                 >1:80        Elevated Antibody Level .    ANA Pattern 1 Nuclear, Speckled (A)     Comment: Speckled pattern is associated with mixed connective tissue disease (MCTD),  systemic lupus erythematosus (SLE), Sjogren's syndrome, dermatomyositis, and  systemic sclerosis/polymyositis overlap. . AC-2,4,5,29: Speckled . International Consensus on ANA Patterns (https://www.hernandez-brewer.com/)      Impression and Recommendations:    Assessment and Plan: 24 y.o. female with Right foot pain ongoing for over 1 month. Physical exam and xray per my read concerning for stress fracture. She finds a cam walker boot to help her foot pain but finds the pressure on her anterior shin to be uncomfortable. Plan to transition to post op shoe. I agree with further rheum workup and recommend Jadon contact [facility] to schedule her initial appointment.   Await radiology review and recheck with me in a few weeks. Return sooner if needed.   PDMP not reviewed this encounter. Orders Placed This Encounter  Procedures  . [place] LIMITED JOINT SPACE STRUCTURES LOW RIGHT(NO LINKED CHARGES)    Order Specific Question:   Reason for Exam (SYMPTOM  OR DIAGNOSIS REQUIRED)    Answer:   R foot pain    Order Specific Question:   Preferred imaging location?    Answer:   [facility]  . DG Foot Complete Right    Standing Status:   Future    Number of Occurrences:   1    Standing Expiration Date:   05/26/2021    Order Specific Question:   Reason for Exam (SYMPTOM  OR DIAGNOSIS REQUIRED)    Answer:   eval forefoot pain    Order Specific Question:   Is patient pregnant?    Answer:   No    Order Specific Question:   Preferred imaging location?    Answer:   [facility]    Order Specific Question:  Radiology Contrast Protocol - do NOT remove file path    Answer:   [email or web address]   No orders of the defined types were placed in this encounter.   Discussed warning signs or symptoms. Please see discharge instructions. Patient expresses understanding.   The above documentation has been reviewed and is  accurate and complete Lynne Leader

## 2020-03-26 NOTE — Patient Instructions (Addendum)
Thank you for coming in today. Plan for xray today.  Transition to post op shoe.  Follow up with Rheumatology about referral [phone] Send me an updated in 2 weeks. If not better I will order MRI.    Stress Fracture  A stress fracture is a small break or crack in a bone. A stress fracture can be fully broken (complete) or partially broken (incomplete). The most common sites for stress fractures are the bones in the front of your feet (metatarsals), your heel (calcaneus), and the long bone of your lower leg (tibia). What are the causes? This condition is caused by overuse or repetitive exercise, such as running. It happens when a bone cannot absorb any more shock because the muscles around it are weak. Stress fractures happen most commonly when:  You rapidly increase or start a new physical activity.  You use shoes that are worn out or do not fit properly.  You exercise on a new surface. What increases the risk? You are more likely to develop this condition if:  You have a condition that causes weak bones (osteoporosis).  You are female. Stress fractures are more likely to occur in women. What are the signs or symptoms? The most common symptom of a stress fracture is feeling pain when you are using or putting weight on the affected part of your body. The pain usually improves when you are resting. Other symptoms may include:  Swelling of the affected area.  Pain in the area when it is touched. Stress fracture pain usually develops over time. How is this diagnosed? This condition may be diagnosed by:  Your symptoms.  Your medical history.  A physical exam.  Imaging tests, such as: ? X-rays. ? MRI. ? Bone scan. How is this treated? Treatment depends on the severity of your stress fracture. It is commonly treated with resting, icing, compression, and elevation (RICE therapy). Treatment may also include:  Medicines to reduce inflammation.  A cast or a walking  shoe.  Crutches.  Surgery. This is usually only in severe cases. Follow these instructions at home: If you have a cast:  Do not put pressure on any part of the cast until it is fully hardened. This may take several hours.  Do not stick anything inside the cast to scratch your skin. Doing that increases your risk of infection.  Check the skin around the cast every day. Tell your health care provider about any concerns.  You may put lotion on dry skin around the edges of the cast. Do not put lotion on the skin underneath the cast.  Keep the cast clean.  If the cast is not waterproof: ? Do not let it get wet. ? Cover it with a watertight covering when you take a bath or a shower. If you have a walking shoe:  Wear the shoe as told by your health care provider. Remove it only as told by your health care provider.  Loosen the shoe if your toes tingle, become numb, or turn cold and blue.  Keep the shoe clean.  If the shoe is not waterproof: ? Do not let it get wet. Managing pain, stiffness, and swelling   If directed, apply ice to the injured area: ? If you have a walking shoe, remove the shoe as told by your health care provider. ? Put ice in a plastic bag. ? Place a towel between your skin and the bag or between your cast and the bag. ? Leave the ice  on for 20 minutes, 2-3 times per day.  Move your toes often to avoid stiffness and to lessen swelling.  Raise (elevate) the injured area above the level of your heart while you are sitting or lying down. Activity  Rest as directed by your health care provider. Ask your health care provider if you may do alternative exercises, such as swimming or biking, while you are healing.  Return to your normal activities as directed by your health care provider. Ask your health care provider what activities are safe for you.  Perform range-of-motion exercises only as directed by your health care provider. General instructions  Do not  use the injured limb to support yourbody weight until your health care provider says that you can. Use crutches if your health care provider tells you to do so.  Do not use any products that contain nicotine or tobacco, such as cigarettes and e-cigarettes. These can delay bone healing. If you need help quitting, ask your health care provider.  Take over-the-counter and prescription medicines only as told by your health care provider.  Keep all follow-up visits as told by your health care provider. This is important. How is this prevented?  Only wear shoes that: ? Fit well. ? Are not worn out.  Eat a healthy diet that contains vitamin D and calcium. This helps keep your bones strong. Good sources of calcium and vitamin D include: ? Low-fat dairy products such as milk, yogurt, and cheese. ? Certain fish, such as fresh or canned salmon, tuna, and sardines. ? Products that have calcium and vitamin D added to them (fortified products), such as fortified cereals or juice.  Be careful when you start a new physical activity. Give your body time to adjust.  Avoid doing only one kind of activity. Do different exercises, such as swimming and running, so that no single part of your body gets overused.  Do strength-training exercises. Contact a health care provider if:  Your pain gets worse.  You have new symptoms.  You have increased swelling. Get help right away if:  You lose feeling in the injured area. Summary  A stress fracture is a small break or crack in a bone. A stress fracture can be fully broken (complete) or partially broken (incomplete).  This condition is caused by overuse or repetitive exercise, such as running.  The most common symptom of a stress fracture is feeling pain when you are using or putting weight on the affected part of your body.  Treatment depends on the severity of your stress fracture. This information is not intended to replace advice given to you by  your health care provider. Make sure you discuss any questions you have with your health care provider. Document Revised: 01/09/2018 Document Reviewed: 01/09/2018 Elsevier Patient Education  2020 [employer].

## 2020-03-27 ENCOUNTER — Encounter: Payer: Self-pay | Admitting: Physician Assistant

## 2020-03-27 ENCOUNTER — Encounter: Payer: Self-pay | Admitting: Allergy

## 2020-03-27 ENCOUNTER — Ambulatory Visit: Payer: BC Managed Care – PPO | Admitting: Allergy

## 2020-03-27 ENCOUNTER — Encounter: Payer: Self-pay | Admitting: Family Medicine

## 2020-03-27 VITALS — BP 110/72 | HR 79 | Temp 98.3°F | Resp 16 | Ht 69.0 in | Wt 162.4 lb

## 2020-03-27 DIAGNOSIS — J45990 Exercise induced bronchospasm: Secondary | ICD-10-CM | POA: Diagnosis not present

## 2020-03-27 DIAGNOSIS — T7840XD Allergy, unspecified, subsequent encounter: Secondary | ICD-10-CM

## 2020-03-27 MED ORDER — ALBUTEROL SULFATE HFA 108 (90 BASE) MCG/ACT IN AERS: INHALATION_SPRAY | RESPIRATORY_TRACT | 1 refills | Status: DC

## 2020-03-27 NOTE — Patient Instructions (Addendum)
Allergic reaction  - recent allergic reaction following meal from [employer].   At this time not concern about the chicken, potato or Ranch as you have had these since reaction without issue.   Most concern would be to the seasonings on the chicken and/or fries.    - will obtain serum IgE levels for paprika, wheat, oregano, garlic, onion and a tryptase level.   - avoid the foods above until labs return  - have access to self-injectable epinephrine Epipen 0.3mg  at all times  - have an albuterol inhaler for use if having coughing/wheezing/chest tightness/difficulty breathing due to allergic reaction  - follow emergency action plan in case of allergic reaction  Exercise induced asthma  - have access to albuterol inhaler 2 puffs every 4-6 hours as needed for cough/wheeze/shortness of breath/chest tightness.  Use 15-20 minutes prior to activity.   Monitor frequency of use.     Follow-up 4-6 months or sooner if needed

## 2020-03-27 NOTE — Progress Notes (Signed)
Xray foot looked normal to the radiologist. I am concerned about a stress fracture. If not significantly better in the next two weeks I recommend getting a MRI to evaluate this further.

## 2020-03-27 NOTE — Progress Notes (Signed)
New Patient Note  RE: Victoria Carson MRN: [id number] DOB: 02-Sep-1996 Date of Office Visit: 03/27/2020  Referring provider: Orma Flaming, MD Primary care provider: Inda Coke, [place]  Chief Complaint: allergic reaction  History of present illness: Victoria Carson is a 24 y.o. female presenting today for consultation for allergic reaction.    On February 19, 2020 she states she had an allergic reaction.   She states she tried to take a nap and states her eyes were itching and then she recalls coughing with difficulty breathing.  States she then realized she was having a reaction and this occurred around 330-4pm.  15 minutes prior to onset of symptoms she had [employer] boneless chicken wings tossed with a tongue torch sauce and seasoned fries and ranch.  She has not had [employer] since.   Earlier in the day she had a cortisone injection around 1:30pm for plantar fascities and bursitis.   She states the [employer] was the first thing she ate for that day.   She called 911 and states EMS gave her Epi injection and she states she felt like her throat was also tight and the tightness was relieved with the medication.   She was taken to ED and states it was taking a long time about 2-3 hours to be seen and she called her PCP and they scheduled her appt next day and she left the ED.   She states the next day she still felt a little tight in her chest.  She was prescribed an epipen and steroids and did not take the steroids.  She does have the EpiPen.  She has had chicken, fries and ranch since without issue from other food establishments.     She states with certain foods she does have coughing after eating especially from [employer] and other fast food establishment.   She states when she would eat [employer] before this reaction she would have GI issues with needing to go to the bathroom.  She states she has "pooped herself" after having a [employer].  She does endorse some mild allergy symptoms with pollen  season but states she may only take Benadryl once if needed.  She does report having an albuterol inhaler for exercise-induced symptoms.  She does note cough and shortness of breath with activity.  Review of systems (in the past 4 weeks): Review of Systems  Constitutional: Negative.   HENT: Negative.   Eyes: Negative.   Respiratory: Negative.   Cardiovascular: Negative.   Gastrointestinal: Negative.   Musculoskeletal: Negative.   Skin: Negative.   Neurological: Negative.     All other systems negative unless noted above in HPI  Past medical history: Past Medical History:  Diagnosis Date  . Chronic cholecystitis 07/2016  . Dental crown present   . Difficulty swallowing pills   . Dust allergy   . Eczema   . GERD (gastroesophageal reflux disease)    no current med.  . Hypothyroidism (acquired)   . IBS (irritable bowel syndrome)   . Interstitial cystitis 2019  . Migraines     Past surgical history: Past Surgical History:  Procedure Laterality Date  . CHOLECYSTECTOMY N/A 08/08/2016   Procedure: LAPAROSCOPIC CHOLECYSTECTOMY;  Surgeon: Coralie Keens, MD;  Location: [place];  Service: General;  Laterality: N/A;  . WISDOM TOOTH EXTRACTION      Family history:  Family History  Problem Relation Age of Onset  . Mental illness Mother   . Drug abuse Mother   .  Heart failure Other   . Hypertension Other   . Depression Paternal Aunt   . Anxiety disorder Paternal Aunt   . Stroke Paternal Grandfather   . Liver disease Maternal Grandmother   . Colon polyps Neg Hx   . Esophageal cancer Neg Hx   . Stomach cancer Neg Hx   . Rectal cancer Neg Hx   . Allergic rhinitis Neg Hx   . Angioedema Neg Hx   . Asthma Neg Hx   . Atopy Neg Hx   . Eczema Neg Hx   . Immunodeficiency Neg Hx   . Urticaria Neg Hx     Social history: Lives in an apartment with carpeting with gas heating and central cooling.  Cat in the home.  No concern for water damage, mildew or  roaches in the home.  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  Denies a smoking history.  Medication List: Current Outpatient Medications  Medication Sig Dispense Refill  . Cyanocobalamin (VITAMIN B 12 PO) Take 1 tablet by mouth daily.    Marland Kitchen EPINEPHrine 0.3 mg/0.3 mL IJ SOAJ injection Inject 0.3 mLs (0.3 mg total) into the muscle as needed for anaphylaxis. 2 each 1  . SYNTHROID 100 MCG tablet Take 1 tablet (100 mcg total) by mouth daily before breakfast. 45 tablet 3  . VITAMIN D PO Take 1,000 Units by mouth daily.    Marland Kitchen albuterol (VENTOLIN HFA) 108 (90 Base) MCG/ACT inhaler 2 puffs every 4-6 hours as needed 18 g 1   No current facility-administered medications for this visit.    Known medication allergies: Allergies  Allergen Reactions  . Citrus Itching and Other (See Comments)    SWEATING  . Sulfa Antibiotics Nausea Only, Other (See Comments) and Nausea And Vomiting    FEVER     Physical examination: Blood pressure 110/72, pulse 79, temperature 98.3 F (36.8 C), temperature source Temporal, resp. rate 16, height 5\' 9"  (1.753 m), weight 162 lb 6.4 oz (73.7 kg), last menstrual period 03/02/2020, SpO2 97 %.  General: Alert, interactive, in no acute distress. HEENT: PERRLA, TMs pearly gray, turbinates non-edematous without discharge, post-pharynx non erythematous. Neck: Supple without lymphadenopathy. Lungs: Clear to auscultation without wheezing, rhonchi or rales. {no increased work of breathing. CV: Normal S1, S2 without murmurs. Abdomen: Nondistended, nontender. Skin: Warm and dry, without lesions or rashes. Extremities:  No clubbing, cyanosis or edema.  Wearing a soft walking boot on right foot Neuro:   Grossly intact.  Diagnositics/Labs: None today  Assessment and plan:   Allergic reaction  - recent allergic reaction following meal from [employer].   At this time not concern about the chicken, potato or Ranch  as you have had these since reaction without issue.   Most concern would be to the seasonings on the chicken and/or fries.    - will obtain serum IgE levels for paprika, wheat, oregano, garlic, onion and a tryptase level.   - avoid the foods above until labs return  - have access to self-injectable epinephrine Epipen 0.3mg  at all times  - have an albuterol inhaler for use if having coughing/wheezing/chest tightness/difficulty breathing due to allergic reaction  - follow emergency action plan in case of allergic reaction  Exercise induced asthma  - have access to albuterol inhaler 2 puffs every 4-6 hours as needed for cough/wheeze/shortness of breath/chest tightness.  Use 15-20 minutes prior to activity.   Monitor frequency of use.     Follow-up  4-6 months or sooner if needed  I appreciate the opportunity to take part in Victoria Carson's care. Please do not hesitate to contact me with questions.  Sincerely,   Prudy Feeler, MD Allergy/Immunology [facility]

## 2020-04-01 LAB — TRYPTASE: Tryptase: 3.8 ug/L (ref 2.2–13.2)

## 2020-04-01 LAB — ALLERGEN, ONION, F48: Allergen Onion IgE: <0.1 kU/L

## 2020-04-01 LAB — ALLERGEN,GRN PEPPER,PAPRIKA,F218: Paprika IgE: <0.1 kU/L

## 2020-04-01 LAB — ALLERGEN, GARLIC, F47: Allergen Garlic IgE: <0.1 kU/L

## 2020-04-01 LAB — ALLERGEN, WHEAT, F4: Wheat IgE: <0.1 kU/L

## 2020-04-01 LAB — ALLERGEN, OREGANO, RF283: F283-IgE Oregano: <0.1 kU/L

## 2020-04-02 ENCOUNTER — Encounter: Payer: Self-pay | Admitting: Family Medicine

## 2020-04-03 ENCOUNTER — Other Ambulatory Visit: Payer: Self-pay

## 2020-04-03 ENCOUNTER — Encounter: Payer: Self-pay | Admitting: Internal Medicine

## 2020-04-03 ENCOUNTER — Other Ambulatory Visit (INDEPENDENT_AMBULATORY_CARE_PROVIDER_SITE_OTHER): Payer: BC Managed Care – PPO

## 2020-04-03 DIAGNOSIS — R220 Localized swelling, mass and lump, head: Secondary | ICD-10-CM

## 2020-04-03 LAB — CORTISOL: Cortisol, Plasma: 8.1 ug/dL

## 2020-04-06 ENCOUNTER — Encounter: Payer: Self-pay | Admitting: Family Medicine

## 2020-04-06 ENCOUNTER — Other Ambulatory Visit: Payer: Self-pay | Admitting: Internal Medicine

## 2020-04-06 DIAGNOSIS — E038 Other specified hypothyroidism: Secondary | ICD-10-CM

## 2020-04-07 ENCOUNTER — Encounter: Payer: Self-pay | Admitting: Family Medicine

## 2020-04-07 DIAGNOSIS — M79671 Pain in right foot: Secondary | ICD-10-CM

## 2020-04-10 ENCOUNTER — Telehealth: Payer: Self-pay | Admitting: *Deleted

## 2020-04-10 NOTE — Telephone Encounter (Signed)
Letter to patient in regards to lab results.

## 2020-04-18 ENCOUNTER — Other Ambulatory Visit: Payer: BC Managed Care – PPO

## 2020-05-01 DIAGNOSIS — Z6823 Body mass index (BMI) 23.0-23.9, adult: Secondary | ICD-10-CM | POA: Diagnosis not present

## 2020-05-01 DIAGNOSIS — N809 Endometriosis, unspecified: Secondary | ICD-10-CM | POA: Diagnosis not present

## 2020-05-05 ENCOUNTER — Encounter: Payer: Self-pay | Admitting: Physician Assistant

## 2020-05-06 ENCOUNTER — Encounter: Payer: Self-pay | Admitting: Internal Medicine

## 2020-05-07 ENCOUNTER — Other Ambulatory Visit: Payer: Self-pay | Admitting: Internal Medicine

## 2020-05-07 DIAGNOSIS — M7989 Other specified soft tissue disorders: Secondary | ICD-10-CM

## 2020-05-12 DIAGNOSIS — N750 Cyst of Bartholin's gland: Secondary | ICD-10-CM

## 2020-05-12 HISTORY — DX: Cyst of Bartholin's gland: N75.0

## 2020-05-14 ENCOUNTER — Other Ambulatory Visit (INDEPENDENT_AMBULATORY_CARE_PROVIDER_SITE_OTHER): Payer: BC Managed Care – PPO

## 2020-05-14 ENCOUNTER — Other Ambulatory Visit: Payer: Self-pay

## 2020-05-14 DIAGNOSIS — E038 Other specified hypothyroidism: Secondary | ICD-10-CM

## 2020-05-14 DIAGNOSIS — M7989 Other specified soft tissue disorders: Secondary | ICD-10-CM | POA: Diagnosis not present

## 2020-05-14 DIAGNOSIS — N750 Cyst of Bartholin's gland: Secondary | ICD-10-CM | POA: Diagnosis not present

## 2020-05-14 DIAGNOSIS — Z6823 Body mass index (BMI) 23.0-23.9, adult: Secondary | ICD-10-CM | POA: Diagnosis not present

## 2020-05-14 DIAGNOSIS — E063 Autoimmune thyroiditis: Secondary | ICD-10-CM | POA: Diagnosis not present

## 2020-05-14 LAB — T4, FREE: Free T4: 0.7 ng/dL (ref 0.60–1.60)

## 2020-05-14 LAB — T3, FREE: T3, Free: 3.4 pg/mL (ref 2.3–4.2)

## 2020-05-14 LAB — TSH: TSH: 4.85 u[IU]/mL — ABNORMAL HIGH (ref 0.35–4.50)

## 2020-05-15 ENCOUNTER — Other Ambulatory Visit: Payer: Self-pay | Admitting: Internal Medicine

## 2020-05-15 DIAGNOSIS — E063 Autoimmune thyroiditis: Secondary | ICD-10-CM

## 2020-05-15 LAB — THYROID PEROXIDASE ANTIBODY: Thyroperoxidase Ab SerPl-aCnc: 511 IU/mL — ABNORMAL HIGH (ref <9)

## 2020-05-15 MED ORDER — SYNTHROID 112 MCG PO TABS: 112.0000 ug | ORAL_TABLET | Freq: Every day | ORAL | 3 refills | Status: DC

## 2020-05-18 LAB — INSULIN-LIKE GROWTH FACTOR
IGF-I, LC/MS: 181 ng/mL (ref 83–456)
Z-Score (Female): -0.3 SD (ref ?–2.0)

## 2020-05-25 ENCOUNTER — Other Ambulatory Visit (HOSPITAL_COMMUNITY): Payer: Self-pay | Admitting: Obstetrics & Gynecology

## 2020-05-25 MED ORDER — LORAZEPAM 1 MG PO TABS
1.0000 mg | ORAL_TABLET | ORAL | 0 refills | Status: DC | PRN
Start: 2020-05-25 — End: 2020-06-26

## 2020-05-25 NOTE — Progress Notes (Signed)
Patient scheduled for in office I&D with me at [facility] 05/26/2020. Prescribed ativan 1mg  to take prior to procedure.

## 2020-05-26 DIAGNOSIS — N750 Cyst of Bartholin's gland: Secondary | ICD-10-CM | POA: Diagnosis not present

## 2020-05-26 DIAGNOSIS — Z6823 Body mass index (BMI) 23.0-23.9, adult: Secondary | ICD-10-CM | POA: Diagnosis not present

## 2020-05-27 ENCOUNTER — Ambulatory Visit: Payer: BC Managed Care – PPO | Admitting: Psychology

## 2020-06-03 DIAGNOSIS — N950 Postmenopausal bleeding: Secondary | ICD-10-CM | POA: Diagnosis not present

## 2020-06-03 DIAGNOSIS — Z6823 Body mass index (BMI) 23.0-23.9, adult: Secondary | ICD-10-CM | POA: Diagnosis not present

## 2020-06-05 ENCOUNTER — Encounter: Payer: Self-pay | Admitting: *Deleted

## 2020-06-09 IMAGING — DX DG FOOT COMPLETE 3+V*R*
3 series · 3 of 3 positions shown · non-contrast
Comparison: None.

CLINICAL DATA: Right foot pain for 1 month without known injury.

EXAM:
RIGHT FOOT COMPLETE - 3+ VIEW

[foot ap]
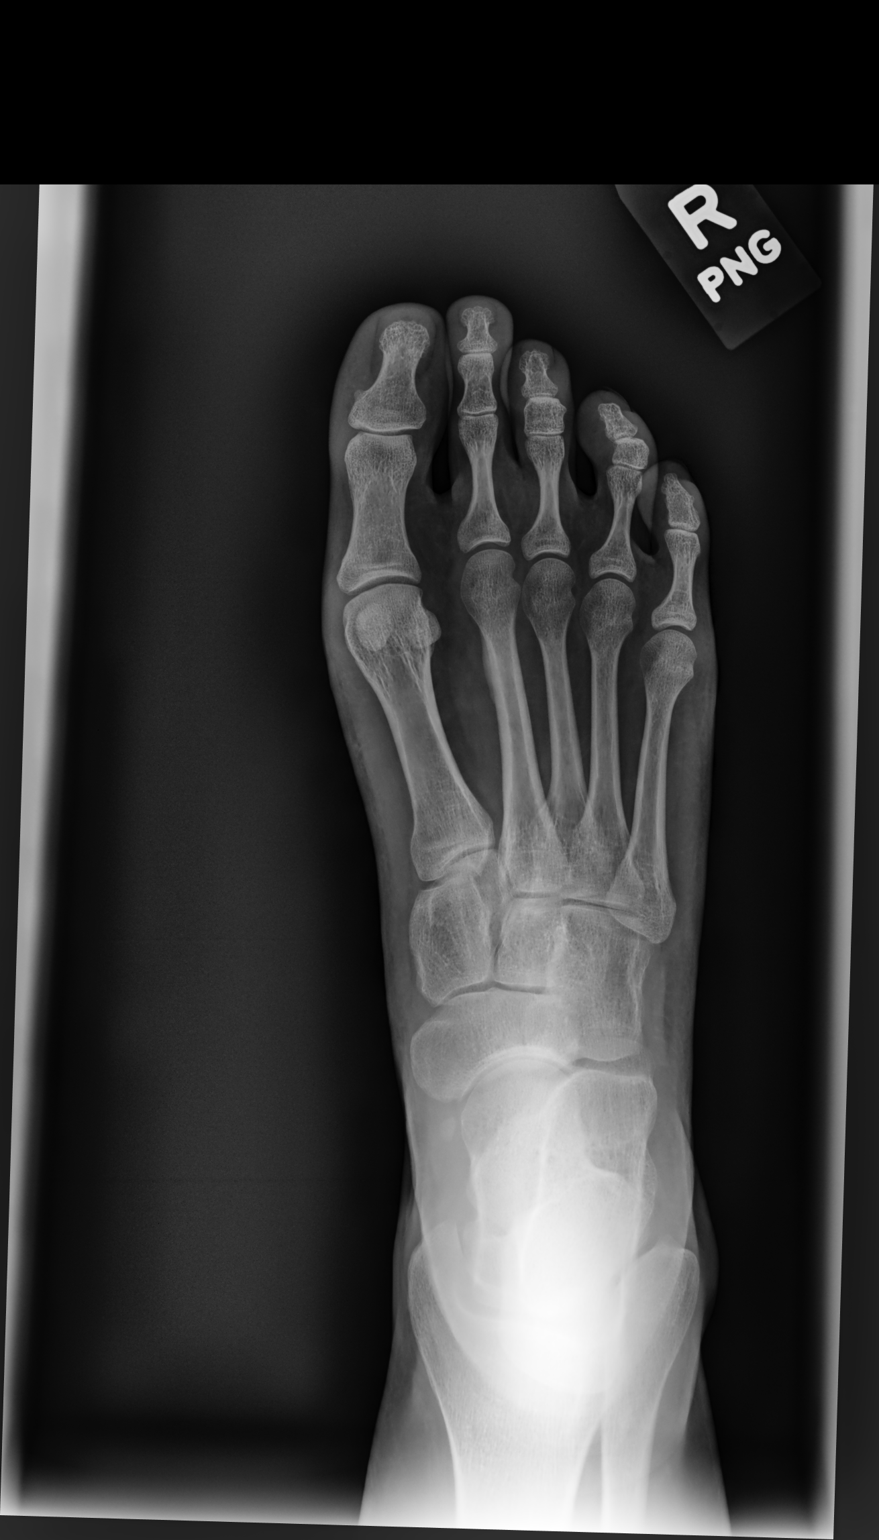

[foot mlo]
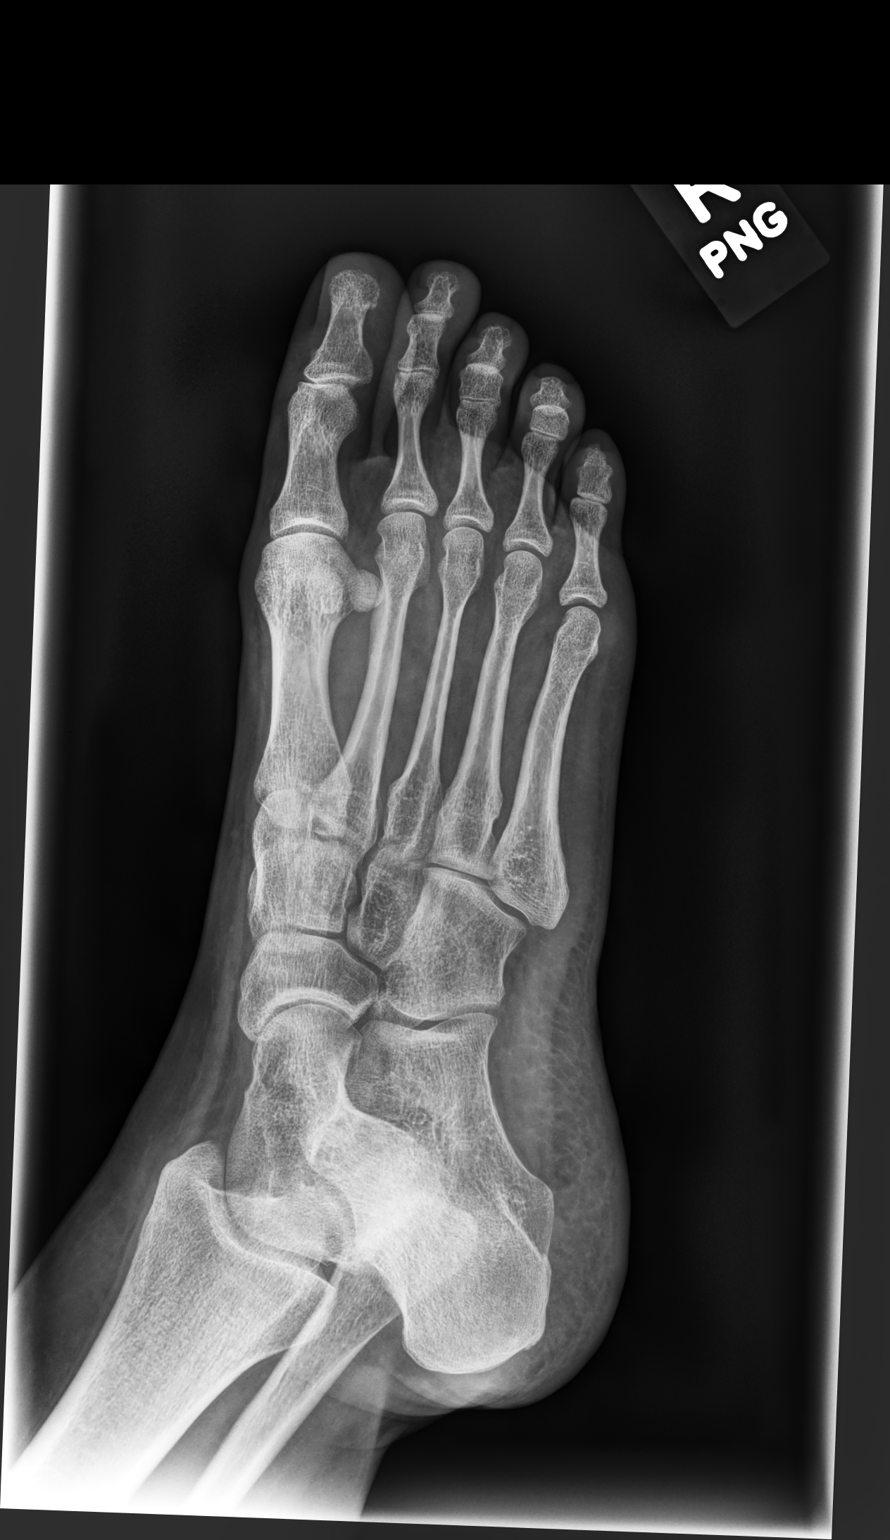

[foot lat]
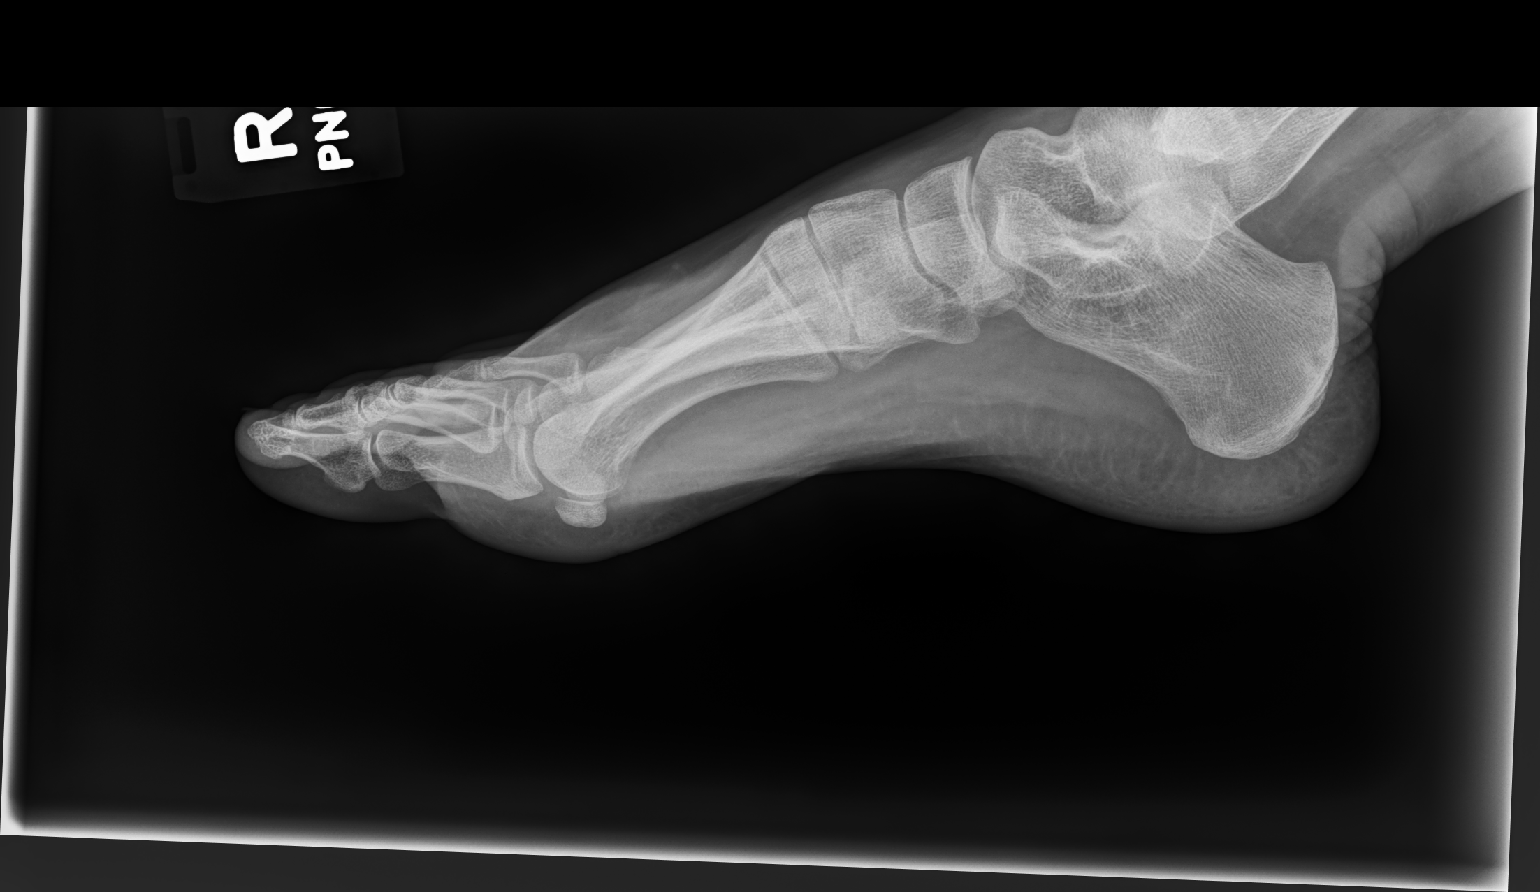

[3 of 3 positions shown; findings below may reference images not displayed]

FINDINGS: There is no evidence of fracture or dislocation. There is no
evidence of arthropathy or other focal bone abnormality. Soft
tissues are unremarkable.
IMPRESSION: Negative.

## 2020-06-12 NOTE — Progress Notes (Signed)
Office Visit Note  Patient: Victoria Carson             Date of Birth: Jul 31, 1996           MRN: [id number]             PCP: Jarold Motto, PA Referring: Jarold Motto, PA Visit Date: 06/26/2020 Occupation: @GUAROCC @  Accompanied by her fiance  Subjective:  Pain in multiple joints.   History of Present Illness: Victoria Carson is a 24 y.o. female seen in consultation by request of her PCP.  According to the patient her symptoms are started in February 2020 with increased fatigue.  At the time she had goiter and was diagnosed with Hashimoto's.  She states the fatigue persists despite the treatment.  In December 2020 she started experiencing increased fatigue and in January 2021 she noticed some swelling on her face.  In March 2021 she started having pain and swelling in her right foot which lasted for almost 2 months.  She was seen by Dr. April 2021 who did ultrasound and x-ray which were nonconclusive.  She was advised to get MRI of her foot which he did not get yet.  She states the swelling is intermittent.  She also has been experiencing pain and swelling in her bilateral hands since January 2021.  She complains of pain and discomfort in the bilateral knee joints, right elbow and both feet.  She gives history of canker sores since she was a child.  She also gives history of dry mouth and dry eyes.  She states she has noticed Raynaud's in her feet. On chart review I found that she had x-rays of her right foot and ankle and 2014 and 2015.  On asking further questions she is recall that she had some issues with her foot back then with swelling and at that time the work-up was negative I also reviewed MRI of her foot which was unremarkable. There is no family history of autoimmune disease except for Hashimoto's.  Activities of Daily Living:  Patient reports morning stiffness for 0 minutes.   Patient Reports nocturnal pain.  Difficulty dressing/grooming: Denies Difficulty climbing stairs:  Denies Difficulty getting out of chair: Denies Difficulty using hands for taps, buttons, cutlery, and/or writing: Denies  Review of Systems  Constitutional: Positive for fatigue. Negative for night sweats, weight gain and weight loss.  HENT: Positive for mouth sores and mouth dryness. Negative for trouble swallowing, trouble swallowing and nose dryness.        H/o OU since childhood  Eyes: Positive for pain, itching and dryness. Negative for photophobia, redness and visual disturbance.  Respiratory: Negative for cough, shortness of breath and difficulty breathing.   Cardiovascular: Negative for chest pain, palpitations, hypertension, irregular heartbeat and swelling in legs/feet.  Gastrointestinal: Positive for constipation and diarrhea. Negative for blood in stool.  Endocrine: Negative for increased urination.  Genitourinary: Positive for difficulty urinating. Negative for vaginal dryness.       H/o IC  Musculoskeletal: Positive for arthralgias, joint pain and joint swelling. Negative for myalgias, muscle weakness, morning stiffness, muscle tenderness and myalgias.  Skin: Positive for color change. Negative for rash, hair loss, redness, skin tightness, ulcers and sensitivity to sunlight.  Allergic/Immunologic: Negative for susceptible to infections.  Neurological: Positive for headaches. Negative for dizziness, numbness, memory loss, night sweats and weakness.       H/o migrane  Hematological: Positive for bruising/bleeding tendency. Negative for swollen glands.  Psychiatric/Behavioral: Positive for depressed mood and sleep  disturbance. Negative for confusion. The patient is nervous/anxious.     PMFS History:  Patient Active Problem List   Diagnosis Date Noted  . Dyspareunia in female 11/19/2019  . Knee pain, bilateral 10/03/2019  . Change in bowel habits 08/22/2019  . Chest pain 07/31/2019  . Vitamin D deficiency 07/31/2019  . Lower extremity weakness 06/05/2019  . Palpitations  03/12/2019  . Hypothyroidism due to Hashimoto's thyroiditis 01/24/2019  . Fatigue 07/06/2018  . Acne vulgaris 10/25/2017  . Interstitial cystitis 10/17/2016  . Encounter for birth control 10/17/2016  . MDD (major depressive disorder), recurrent episode, severe ([place]) 02/15/2013  . GAD (generalized anxiety disorder) 02/15/2013    Past Medical History:  Diagnosis Date  . Chronic cholecystitis 07/2016  . Cyst of right Bartholin's gland 05/2020  . Dental crown present   . Difficulty swallowing pills   . Dust allergy   . Eczema   . GERD (gastroesophageal reflux disease)    no current med.  . Hypothyroidism (acquired)   . IBS (irritable bowel syndrome)    Colonoscopy 09/04/2019 x 2 precanerous polyps removed, repeat 3-5 yrs  . Interstitial cystitis 2019  . Migraines     Family History  Problem Relation Age of Onset  . Mental illness Mother   . Drug abuse Mother   . Healthy Father   . Healthy Sister   . Healthy Brother   . Heart failure Other   . Hypertension Other   . Depression Paternal Aunt   . Anxiety disorder Paternal Aunt   . Stroke Paternal Grandfather   . Liver disease Maternal Grandmother   . Colon polyps Neg Hx   . Esophageal cancer Neg Hx   . Stomach cancer Neg Hx   . Rectal cancer Neg Hx   . Allergic rhinitis Neg Hx   . Angioedema Neg Hx   . Asthma Neg Hx   . Atopy Neg Hx   . Eczema Neg Hx   . Immunodeficiency Neg Hx   . Urticaria Neg Hx    Past Surgical History:  Procedure Laterality Date  . CHOLECYSTECTOMY N/A 08/08/2016   Procedure: LAPAROSCOPIC CHOLECYSTECTOMY;  Surgeon: Coralie Keens, MD;  Location: [facility];  Service: General;  Laterality: N/A;  . WISDOM TOOTH EXTRACTION     Social History   Social History Narrative   05/01/2013 AHW Leyli was poor in [place], [place].. She currently lives in [place] with her father, grandfather, older sister, and younger brother. She currently is homeschooled and in the 10th grade.  She has a boyfriend of 2 months. 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 2 years ago, she was caught shoplifting. She reports that her social support system consists of a friend. 05/01/2013 AHW      Single. Lives with father.    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   Enjoys spending time with her friends.               Immunization History  Administered Date(s) Administered  . DTaP 07/03/1997, 09/02/1997, 11/11/1997, 07/30/1998  . HPV Quadrivalent 11/19/2013, 01/21/2014, 05/22/2014  . Hepatitis B 10[birth year]14/[birth year], 07/03/1997, 11/11/1997  . HiB (PRP-OMP) 07/03/1997, 09/02/1997, 11/11/1997, 04/28/1998  . IPV 07/03/1997, 09/02/1997, 04/28/1998  . MMR 07/30/1998  . PFIZER SARS-COV-2 Vaccination 03/21/2020, 04/18/2020  . Td 01/10/2018  . Tdap 08/25/2008  . Varicella 10/19/1998     Objective: Vital Signs: BP 115/75 (BP Location: Right Arm, Patient Position: Sitting, Cuff Size: Normal)   Pulse 69  Resp 13   Ht 5' 8.5" (1.74 m)   Wt 157 lb (71.2 kg)   BMI 23.52 kg/m    Physical Exam Vitals and nursing note reviewed.  Constitutional:      Appearance: She is well-developed.  HENT:     Head: Normocephalic and atraumatic.  Eyes:     Conjunctiva/sclera: Conjunctivae normal.  Cardiovascular:     Rate and Rhythm: Normal rate and regular rhythm.     Heart sounds: Normal heart sounds.  Pulmonary:     Effort: Pulmonary effort is normal.     Breath sounds: Normal breath sounds.  Abdominal:     General: Bowel sounds are normal.     Palpations: Abdomen is soft.  Musculoskeletal:     Cervical back: Normal range of motion.  Lymphadenopathy:     Cervical: No cervical adenopathy.  Skin:    General: Skin is warm and dry.     Capillary Refill: Capillary refill takes less than 2 seconds.  Neurological:     Mental Status: She is alert and oriented to person, place, and time.  Psychiatric:        Behavior: Behavior normal.      Musculoskeletal Exam:  C-spine, thoracic and lumbar spine with good range of motion.  Shoulder joints, elbow joints, wrist joints, MCPs PIPs and DIPs but good range of motion with no synovitis.  Hip joints, knee joints, ankles, MTPs and PIPs with good range of motion with no synovitis.  She has generalized hyperalgesia.  She has positive tender points.  She had tenderness on palpation over bilateral trapezius region costochondral region, lateral epicondyle, medial aspect of her knees, gluteal region and trochanteric bursa.  She also has mild hypermobility in her joints.  CDAI Exam: CDAI Score: -- Patient Global: --; Provider Global: -- Swollen: --; Tender: -- Joint Exam 06/26/2020   No joint exam has been documented for this visit   There is currently no information documented on the homunculus. Go to the Rheumatology activity and complete the homunculus joint exam.  Investigation: No additional findings.  Imaging: XR Hand 2 View Left  Result Date: 06/26/2020 No DIP or PIP narrowing was noted.  No MCP, intercarpal radiocarpal joint space narrowing was noted.  No erosive changes were noted. Impression: Unremarkable x-ray of the hand.  XR Hand 2 View Right  Result Date: 06/26/2020 No DIP or PIP narrowing was noted.  No MCP, intercarpal radiocarpal joint space narrowing was noted.  No erosive changes were noted. Impression: Unremarkable x-ray of the hand.  XR KNEE 3 VIEW LEFT  Result Date: 06/26/2020 No medial lateral compartment narrowing was noted.  The patellofemoral narrowing was noted.  No chondrocalcinosis was noted. Impression: Unremarkable x-ray of the knee joint.  XR KNEE 3 VIEW RIGHT  Result Date: 06/26/2020 No medial lateral compartment narrowing was noted.  The patellofemoral narrowing was noted.  No chondrocalcinosis was noted. Impression: Unremarkable x-ray of the knee joint.   Recent Labs: Lab Results  Component Value Date   WBC 5.1 03/16/2020   HGB 13.3 03/16/2020   PLT 250.0 03/16/2020    NA 140 03/16/2020   K 4.5 03/16/2020   CL 106 03/16/2020   CO2 28 03/16/2020   GLUCOSE 91 03/16/2020   BUN 8 03/16/2020   CREATININE 0.76 03/16/2020   BILITOT 0.4 03/16/2020   ALKPHOS 99 03/16/2020   AST 11 03/16/2020   ALT 9 03/16/2020   PROT 6.4 03/16/2020   ALBUMIN 4.1 03/16/2020   CALCIUM 9.4 03/16/2020  GFRAA >60 12/09/2019    Speciality Comments: No specialty comments available.  Procedures:  No procedures performed Allergies: Citrus and Sulfa antibiotics   Assessment / Plan:     Visit Diagnoses: Polyarthralgia-patient complains of pain and discomfort in multiple joints.  Pain in both hands -she gives history of pain in bilateral hands with intermittent swelling.  No synovitis was noted.  Plan: XR Hand 2 View Right, XR Hand 2 View Left  Chronic pain of both knees -she complains of pain and discomfort in the bilateral knee joints.  No warmth swelling or effusion was noted.  Plan: XR KNEE 3 VIEW RIGHT, XR KNEE 3 VIEW LEFT  Positive ANA (antinuclear antibody) - 03/16/20: ANA 1:80NS, RF<14, ESR 3, CRP<1.  She has low titer ANA.  I will obtainAVISE labs to evaluate this further.  Myofascial pain-she has mild hypermobility in her joints.  She also has generalized hyperalgesia and positive tender points.  I provided brief counseling regarding myofascial pain and fibromyalgia.  Need for regular exercise and good sleep hygiene was discussed.  She will benefit from water aerobics and swimming.  It is not unusual to see myofascial pain syndrome.  IBS, migraines and IC.  She has been also diagnosed with endometriosis lately.  History of gastroesophageal reflux (GERD)  History of IBS  History of chronic cholecystitis  Hypothyroidism due to Hashimoto's thyroiditis  Acne vulgaris  Interstitial cystitis-patient was seen by urologist in the past.  Hx of migraines  History of endometriosis  GAD (generalized anxiety disorder)-she is on medications.  Severe episode of  recurrent major depressive disorder, without psychotic features ([place])  Vitamin D deficiency-she reports taking supplements.  Orders: Orders Placed This Encounter  Procedures  . XR Hand 2 View Right  . XR Hand 2 View Left  . XR KNEE 3 VIEW RIGHT  . XR KNEE 3 VIEW LEFT   No orders of the defined types were placed in this encounter.     Follow-Up Instructions: Return for Polyarthralgia, positive ANA.   Bo Merino, MD  Note - This record has been created using Editor, commissioning.  Chart creation errors have been sought, but may not always  have been located. Such creation errors do not reflect on  the standard of medical care.

## 2020-06-19 DIAGNOSIS — Z6823 Body mass index (BMI) 23.0-23.9, adult: Secondary | ICD-10-CM | POA: Diagnosis not present

## 2020-06-19 DIAGNOSIS — N750 Cyst of Bartholin's gland: Secondary | ICD-10-CM | POA: Diagnosis not present

## 2020-06-26 ENCOUNTER — Other Ambulatory Visit: Payer: Self-pay

## 2020-06-26 ENCOUNTER — Ambulatory Visit: Payer: Self-pay

## 2020-06-26 ENCOUNTER — Ambulatory Visit: Payer: Managed Care, Other (non HMO) | Admitting: Rheumatology

## 2020-06-26 ENCOUNTER — Encounter: Payer: Self-pay | Admitting: Rheumatology

## 2020-06-26 VITALS — BP 115/75 | HR 69 | Resp 13 | Ht 68.5 in | Wt 157.0 lb

## 2020-06-26 DIAGNOSIS — M79641 Pain in right hand: Secondary | ICD-10-CM | POA: Diagnosis not present

## 2020-06-26 DIAGNOSIS — G8929 Other chronic pain: Secondary | ICD-10-CM

## 2020-06-26 DIAGNOSIS — E038 Other specified hypothyroidism: Secondary | ICD-10-CM

## 2020-06-26 DIAGNOSIS — L700 Acne vulgaris: Secondary | ICD-10-CM

## 2020-06-26 DIAGNOSIS — N3010 Interstitial cystitis (chronic) without hematuria: Secondary | ICD-10-CM

## 2020-06-26 DIAGNOSIS — M25562 Pain in left knee: Secondary | ICD-10-CM

## 2020-06-26 DIAGNOSIS — R7689 Other specified abnormal immunological findings in serum: Secondary | ICD-10-CM

## 2020-06-26 DIAGNOSIS — F411 Generalized anxiety disorder: Secondary | ICD-10-CM

## 2020-06-26 DIAGNOSIS — M7918 Myalgia, other site: Secondary | ICD-10-CM

## 2020-06-26 DIAGNOSIS — Z8742 Personal history of other diseases of the female genital tract: Secondary | ICD-10-CM

## 2020-06-26 DIAGNOSIS — F332 Major depressive disorder, recurrent severe without psychotic features: Secondary | ICD-10-CM

## 2020-06-26 DIAGNOSIS — M25561 Pain in right knee: Secondary | ICD-10-CM

## 2020-06-26 DIAGNOSIS — R768 Other specified abnormal immunological findings in serum: Secondary | ICD-10-CM

## 2020-06-26 DIAGNOSIS — M79642 Pain in left hand: Secondary | ICD-10-CM

## 2020-06-26 DIAGNOSIS — Z8719 Personal history of other diseases of the digestive system: Secondary | ICD-10-CM

## 2020-06-26 DIAGNOSIS — Z8669 Personal history of other diseases of the nervous system and sense organs: Secondary | ICD-10-CM

## 2020-06-26 DIAGNOSIS — M2550 Pain in unspecified joint: Secondary | ICD-10-CM | POA: Diagnosis not present

## 2020-06-26 DIAGNOSIS — E063 Autoimmune thyroiditis: Secondary | ICD-10-CM

## 2020-06-26 DIAGNOSIS — E559 Vitamin D deficiency, unspecified: Secondary | ICD-10-CM

## 2020-07-02 ENCOUNTER — Encounter: Payer: Self-pay | Admitting: Rheumatology

## 2020-07-02 DIAGNOSIS — R768 Other specified abnormal immunological findings in serum: Secondary | ICD-10-CM | POA: Diagnosis not present

## 2020-07-10 ENCOUNTER — Encounter: Payer: Self-pay | Admitting: Physician Assistant

## 2020-07-10 ENCOUNTER — Other Ambulatory Visit: Payer: Self-pay

## 2020-07-10 ENCOUNTER — Ambulatory Visit (INDEPENDENT_AMBULATORY_CARE_PROVIDER_SITE_OTHER): Payer: BC Managed Care – PPO | Admitting: Physician Assistant

## 2020-07-10 VITALS — BP 110/76 | HR 64 | Temp 98.5°F | Ht 68.5 in | Wt 158.6 lb

## 2020-07-10 DIAGNOSIS — F411 Generalized anxiety disorder: Secondary | ICD-10-CM | POA: Diagnosis not present

## 2020-07-10 DIAGNOSIS — K589 Irritable bowel syndrome without diarrhea: Secondary | ICD-10-CM | POA: Diagnosis not present

## 2020-07-10 MED ORDER — PROPRANOLOL HCL 20 MG PO TABS: 20.0000 mg | ORAL_TABLET | Freq: Three times a day (TID) | ORAL | 1 refills | Status: DC | PRN

## 2020-07-10 NOTE — Progress Notes (Signed)
Victoria Carson is a 24 y.o. female is here to discuss: anxiety  I acted as a Education administrator for [employer], PA-C Serita Sheller, [place]  History of Present Illness:   Chief Complaint  Patient presents with   Anxiety    HPI  Anxiety Pt c/o anxiety. Pt does not take anything. Pt is under stress, this causes worsening symptoms. Pt does not like going places alone.  Brother recently got out of behavioral health She is the middle man for her family members that are having drama Feeling panicky at times -- like when on elevators Has obsessive thoughts at baseline, and this is worsening now Struggling with social situations -- crowd related and when around her fiance's family  Having trouble staying asleep.  Used to see Dr. Cheryln Manly; going to see a new counselor next week.  Was on remeron (upset stomach), paxil (upset stomach), lexapro (made her suicidal) She was on lamictal and was unable to take regularly due to pill size  Denies current SI/HI.  IBS Continues to have irregularity in GI symptoms. Has been told in the past that she has IBS. Feels that her symptoms have worsened with increased anxiety.  She has had thorough work-up by GI in the past.  Denies rectal bleeding or unexplained weight changes.    Health Maintenance Due  Topic Date Due   Hepatitis C Screening  Never done    Past Medical History:  Diagnosis Date   Chronic cholecystitis 07/2016   Cyst of right Bartholin's gland 05/2020   Dental crown present    Difficulty swallowing pills    Dust allergy    Eczema    GERD (gastroesophageal reflux disease)    no current med.   Hypothyroidism (acquired)    IBS (irritable bowel syndrome)    Colonoscopy 09/04/2019 x 2 precanerous polyps removed, repeat 3-5 yrs   Interstitial cystitis 2019   Migraines      Social History   Tobacco Use   Smoking status: Never Smoker   Smokeless tobacco: Never Used  Vaping Use   Vaping Use: Never used    Substance Use Topics   Alcohol use: Yes    Comment: occassional   Drug use: No    Past Surgical History:  Procedure Laterality Date   CHOLECYSTECTOMY N/A 08/08/2016   Procedure: LAPAROSCOPIC CHOLECYSTECTOMY;  Surgeon: Coralie Keens, MD;  Location: [facility];  Service: General;  Laterality: N/A;   WISDOM TOOTH EXTRACTION      Family History  Problem Relation Age of Onset   Mental illness Mother    Drug abuse Mother    Healthy Father    Healthy Sister    Healthy Brother    Heart failure Other    Hypertension Other    Depression Paternal Aunt    Anxiety disorder Paternal Aunt    Stroke Paternal Grandfather    Liver disease Maternal Grandmother    Colon polyps Neg Hx    Esophageal cancer Neg Hx    Stomach cancer Neg Hx    Rectal cancer Neg Hx    Allergic rhinitis Neg Hx    Angioedema Neg Hx    Asthma Neg Hx    Atopy Neg Hx    Eczema Neg Hx    Immunodeficiency Neg Hx    Urticaria Neg Hx     PMHx, SurgHx, SocialHx, FamHx, Medications, and Allergies were reviewed in the Visit Navigator and updated as appropriate.   Patient Active Problem List   Diagnosis Date  Noted   Dyspareunia in female 11/19/2019   Knee pain, bilateral 10/03/2019   Change in bowel habits 08/22/2019   Chest pain 07/31/2019   Vitamin D deficiency 07/31/2019   Lower extremity weakness 06/05/2019   Palpitations 03/12/2019   Hypothyroidism due to Hashimoto's thyroiditis 01/24/2019   Fatigue 07/06/2018   Acne vulgaris 10/25/2017   Interstitial cystitis 10/17/2016   Encounter for birth control 10/17/2016   MDD (major depressive disorder), recurrent episode, severe ([place]) 02/15/2013   GAD (generalized anxiety disorder) 02/15/2013    Social History   Tobacco Use   Smoking status: Never Smoker   Smokeless tobacco: Never Used  Vaping Use   Vaping Use: Never used  Substance Use Topics   Alcohol use: Yes    Comment: occassional   Drug  use: No    Current Medications and Allergies:    Current Outpatient Medications:    albuterol (VENTOLIN HFA) 108 (90 Base) MCG/ACT inhaler, 2 puffs every 4-6 hours as needed, Disp: 18 g, Rfl: 1   EPINEPHrine 0.3 mg/0.3 mL IJ SOAJ injection, Inject 0.3 mLs (0.3 mg total) into the muscle as needed for anaphylaxis., Disp: 2 each, Rfl: 1   SYNTHROID 112 MCG tablet, Take 1 tablet (112 mcg total) by mouth daily before breakfast., Disp: 45 tablet, Rfl: 3   propranolol (INDERAL) 20 MG tablet, Take 1 tablet (20 mg total) by mouth 3 (three) times daily as needed (anxiety)., Disp: 90 tablet, Rfl: 1   Allergies  Allergen Reactions   Citrus Itching and Other (See Comments)    SWEATING   Sulfa Antibiotics Nausea Only, Other (See Comments) and Nausea And Vomiting    FEVER    Review of Systems   ROS Negative unless otherwise specified per HPI.  Vitals:   Vitals:   07/10/20 1139  BP: 110/76  Pulse: 64  Temp: 98.5 F (36.9 C)  TempSrc: Temporal  SpO2: 98%  Weight: 158 lb 9.6 oz (71.9 kg)  Height: 5' 8.5" (1.74 m)     Body mass index is 23.76 kg/m.   Physical Exam:    Physical Exam Vitals and nursing note reviewed.  Constitutional:      General: She is not in acute distress.    Appearance: She is well-developed. She is not ill-appearing or toxic-appearing.  Cardiovascular:     Rate and Rhythm: Normal rate and regular rhythm.     Pulses: Normal pulses.     Heart sounds: Normal heart sounds, S1 normal and S2 normal.     Comments: No LE edema Pulmonary:     Effort: Pulmonary effort is normal.     Breath sounds: Normal breath sounds.  Skin:    General: Skin is warm and dry.  Neurological:     Mental Status: She is alert.     GCS: GCS eye subscore is 4. GCS verbal subscore is 5. GCS motor subscore is 6.  Psychiatric:        Speech: Speech normal.        Behavior: Behavior normal. Behavior is cooperative.      Assessment and Plan:    Victoria Carson was seen today for  anxiety.  Diagnoses and all orders for this visit:  GAD (generalized anxiety disorder) She has tried several medications without significant relief of symptoms.  She is concerned about taking any medication is used to treat depression due to history of worsening mood on these medications.  Discussed with her that I would be willing to try propanolol as needed for her  symptoms however if this does not help her, will likely need to send to psychiatry.  Agree with ongoing counseling.  Follow-up as needed.  Irritable bowel syndrome, unspecified type Recommended that she consider to take an over-the-counter probiotic to help her symptoms.  Further work-up per GI.  Other orders -     propranolol (INDERAL) 20 MG tablet; Take 1 tablet (20 mg total) by mouth 3 (three) times daily as needed (anxiety).     Reviewed expectations re: course of current medical issues.  Discussed self-management of symptoms.  Outlined signs and symptoms indicating need for more acute intervention.  Patient verbalized understanding and all questions were answered.  See orders for this visit as documented in the electronic medical record.  Patient received an After Visit Summary.  CMA or LPN served as scribe during this visit. History, Physical, and Plan performed by medical provider. The above documentation has been reviewed and is accurate and complete.   Inda Coke, PA-C [place], [place] 07/10/2020  Follow-up: No follow-ups on file.

## 2020-07-10 NOTE — Patient Instructions (Addendum)
It was great to see you.  Please start propranolol 20 mg -- may take up three times daily for anxiety.  Per your endocrinologist:  "TSH level is higher than target, so let's increase your Synthroid to 112 mcg daily and recheck your thyroid test in 1.5 months.  Please call our main office number [phone]) to schedule a lab appointment."  Try to add in a daily probiotic to see if this helps your stomach.  Follow-up as needed.   Aldona Bar

## 2020-07-13 ENCOUNTER — Encounter: Payer: Self-pay | Admitting: Physician Assistant

## 2020-07-13 ENCOUNTER — Other Ambulatory Visit: Payer: Self-pay | Admitting: Physician Assistant

## 2020-07-13 DIAGNOSIS — F411 Generalized anxiety disorder: Secondary | ICD-10-CM

## 2020-07-13 DIAGNOSIS — F332 Major depressive disorder, recurrent severe without psychotic features: Secondary | ICD-10-CM

## 2020-07-13 NOTE — Progress Notes (Signed)
Office Visit Note  Patient: Victoria Carson             Date of Birth: Jul 31, 1996           MRN: [id number]             PCP: Inda Coke, PA Referring: Inda Coke, PA Visit Date: 07/16/2020 Occupation: _0 @  Subjective:  Pain in joints and muscles   History of Present Illness: Victoria Carson is a 24 y.o. female who was evaluated for positive ANA, joint pain and muscle pain.  She states she continues to have some discomfort in her muscles and joints.  She denies any joint swelling.  She gives history of insomnia and fatigue.  Activities of Daily Living:  Patient reports morning stiffness for  1 minute.   Patient Denies nocturnal pain.  Difficulty dressing/grooming: Denies Difficulty climbing stairs: Denies Difficulty getting out of chair: Denies Difficulty using hands for taps, buttons, cutlery, and/or writing: Denies  Review of Systems  Constitutional: Positive for fatigue.  HENT: Negative for mouth sores, mouth dryness and nose dryness.   Eyes: Positive for itching and dryness. Negative for pain.  Respiratory: Negative for shortness of breath and difficulty breathing.   Cardiovascular: Negative for chest pain and palpitations.  Gastrointestinal: Positive for constipation and diarrhea. Negative for blood in stool.  Endocrine: Negative for increased urination.  Genitourinary: Negative for difficulty urinating.  Musculoskeletal: Positive for arthralgias, joint pain and morning stiffness. Negative for joint swelling, myalgias, muscle tenderness and myalgias.  Skin: Negative for color change, rash and redness.  Allergic/Immunologic: Negative for susceptible to infections.  Neurological: Positive for headaches. Negative for dizziness, numbness, memory loss and weakness.  Hematological: Positive for bruising/bleeding tendency.  Psychiatric/Behavioral: Positive for sleep disturbance. Negative for confusion.    PMFS History:  Patient Active Problem List    Diagnosis Date Noted  . Dyspareunia in female 11/19/2019  . Knee pain, bilateral 10/03/2019  . Change in bowel habits 08/22/2019  . Chest pain 07/31/2019  . Vitamin D deficiency 07/31/2019  . Lower extremity weakness 06/05/2019  . Palpitations 03/12/2019  . Hypothyroidism due to Hashimoto's thyroiditis 01/24/2019  . Fatigue 07/06/2018  . Acne vulgaris 10/25/2017  . Interstitial cystitis 10/17/2016  . Encounter for birth control 10/17/2016  . MDD (major depressive disorder), recurrent episode, severe ([place]) 02/15/2013  . GAD (generalized anxiety disorder) 02/15/2013    Past Medical History:  Diagnosis Date  . Chronic cholecystitis 07/2016  . Cyst of right Bartholin's gland 05/2020  . Dental crown present   . Difficulty swallowing pills   . Dust allergy   . Eczema   . GERD (gastroesophageal reflux disease)    no current med.  . Hypothyroidism (acquired)   . IBS (irritable bowel syndrome)    Colonoscopy 09/04/2019 x 2 precanerous polyps removed, repeat 3-5 yrs  . Interstitial cystitis 2019  . Migraines     Family History  Problem Relation Age of Onset  . Mental illness Mother   . Drug abuse Mother   . Healthy Father   . Healthy Sister   . Healthy Brother   . Heart failure Other   . Hypertension Other   . Depression Paternal Aunt   . Anxiety disorder Paternal Aunt   . Stroke Paternal Grandfather   . Liver disease Maternal Grandmother   . Colon polyps Neg Hx   . Esophageal cancer Neg Hx   . Stomach cancer Neg Hx   . Rectal cancer Neg Hx   .  Allergic rhinitis Neg Hx   . Angioedema Neg Hx   . Asthma Neg Hx   . Atopy Neg Hx   . Eczema Neg Hx   . Immunodeficiency Neg Hx   . Urticaria Neg Hx    Past Surgical History:  Procedure Laterality Date  . CHOLECYSTECTOMY N/A 08/08/2016   Procedure: LAPAROSCOPIC CHOLECYSTECTOMY;  Surgeon: Coralie Keens, MD;  Location: [facility];  Service: General;  Laterality: N/A;  . WISDOM TOOTH EXTRACTION     Social  History   Social History Narrative   05/01/2013 AHW Nadean was poor in [place], [place].. She currently lives in [place] with her father, grandfather, older sister, and younger brother. She currently is homeschooled and in the 10th grade. She has a boyfriend of 2 months. 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 2 years ago, she was caught shoplifting. She reports that her social support system consists of a friend. 05/01/2013 AHW      Single. Lives with father.    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   Enjoys spending time with her friends.   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 DTaP 07/03/1997, 09/02/1997, 11/11/1997, 07/30/1998  . HPV Quadrivalent 11/19/2013, 01/21/2014, 05/22/2014  . Hepatitis B 07-25-1997, 07/03/1997, 11/11/1997  . HiB (PRP-OMP) 07/03/1997, 09/02/1997, 11/11/1997, 04/28/1998  . IPV 07/03/1997, 09/02/1997, 04/28/1998  . MMR 07/30/1998  . PFIZER SARS-COV-2 Vaccination 03/21/2020, 04/18/2020  . Td 01/10/2018  . Tdap 08/25/2008  . Varicella 10/19/1998     Objective: Vital Signs: BP 119/69 (BP Location: Right Arm, Patient Position: Sitting, Cuff Size: Normal)   Pulse 93   Resp 13   Ht _0  (1.753 m)   Wt 161 lb 12.8 oz (73.4 kg)   BMI 23.89 kg/m    Physical Exam Vitals and nursing note reviewed.  Constitutional:      Appearance: She is well-developed.  HENT:     Head: Normocephalic and atraumatic.  Eyes:     Conjunctiva/sclera: Conjunctivae normal.  Cardiovascular:     Rate and Rhythm: Normal rate and regular rhythm.     Heart sounds: Normal heart sounds.  Pulmonary:     Effort: Pulmonary effort is normal.     Breath sounds: Normal breath sounds.  Abdominal:     General: Bowel sounds are normal.     Palpations: Abdomen is soft.  Musculoskeletal:     Cervical back: Normal range of motion.  Lymphadenopathy:     Cervical: No  cervical adenopathy.  Skin:    General: Skin is warm and dry.     Capillary Refill: Capillary refill takes less than 2 seconds.  Neurological:     Mental Status: She is alert and oriented to person, place, and time.  Psychiatric:        Behavior: Behavior normal.      Musculoskeletal Exam: C-spine, thoracic and lumbar spine were in good range of motion.  Shoulder joints, elbow joints, wrist joints, MCPs PIPs and DIPs with good range of motion with no synovitis.  Hip joints, knee joints, ankles, MTPs and PIPs with good range of motion with no synovitis.  CDAI Exam: CDAI Score: -- Patient Global: --; Provider Global: -- Swollen: --; Tender: -- Joint Exam 07/16/2020   No joint exam has been documented for this visit   There is currently no information documented on the homunculus. Go to the  Rheumatology activity and complete the homunculus joint exam.  Investigation: No additional findings.  Imaging: XR Hand 2 View Left  Result Date: 06/26/2020 No DIP or PIP narrowing was noted.  No MCP, intercarpal radiocarpal joint space narrowing was noted.  No erosive changes were noted. Impression: Unremarkable x-ray of the hand.  XR Hand 2 View Right  Result Date: 06/26/2020 No DIP or PIP narrowing was noted.  No MCP, intercarpal radiocarpal joint space narrowing was noted.  No erosive changes were noted. Impression: Unremarkable x-ray of the hand.  XR KNEE 3 VIEW LEFT  Result Date: 06/26/2020 No medial lateral compartment narrowing was noted.  The patellofemoral narrowing was noted.  No chondrocalcinosis was noted. Impression: Unremarkable x-ray of the knee joint.  XR KNEE 3 VIEW RIGHT  Result Date: 06/26/2020 No medial lateral compartment narrowing was noted.  The patellofemoral narrowing was noted.  No chondrocalcinosis was noted. Impression: Unremarkable x-ray of the knee joint.   Recent Labs: Lab Results  Component Value Date   WBC 5.1 03/16/2020   HGB 13.3 03/16/2020   PLT  250.0 03/16/2020   NA 140 03/16/2020   K 4.5 03/16/2020   CL 106 03/16/2020   CO2 28 03/16/2020   GLUCOSE 91 03/16/2020   BUN 8 03/16/2020   CREATININE 0.76 03/16/2020   BILITOT 0.4 03/16/2020   ALKPHOS 99 03/16/2020   AST 11 03/16/2020   ALT 9 03/16/2020   PROT 6.4 03/16/2020   ALBUMIN 4.1 03/16/2020   CALCIUM 9.4 03/16/2020   GFRAA >60 12/09/2019   July 02, 2020 AVISE lupus index -1.2, ANA 1: 160 homogeneous, ENA negative, CB CAP negative, Jo 1 -, anticardiolipin negative, beta-2 GP 1 -, antiphosphatidylserine negative, antihistone negative, RF negative, anti-CCP negative and anti-Carr P-, antithyroglobulin positive, anti-TPO positive  speciality Comments: No specialty comments available.  Procedures:  No procedures performed Allergies: Citrus and Sulfa antibiotics   Assessment / Plan:     Visit Diagnoses: Pain in both hands - X-rays were unremarkable at the last visit.  All autoimmune work-up is negative.  She had no synovitis on examination.  She has some hypermobility which could be causing discomfort.  Hand muscle strengthening exercises were demonstrated in the office today.  Chronic pain of both knees - X-rays were unremarkable at the last visit.  She had no warmth swelling or effusion.  Positive ANA (antinuclear antibody) - March 16, 2020 ANA 1: 80NS,AVISE labs showed positive anti-TPO and antithyroglobulin antibodies.  All other autoimmune labs were negative.  I detailed discussion regarding the labs with Victoria Carson.  Myofascial pain - Victoria Carson had generalized hyperalgesia and positive tender points.  Hypermobility was noted.  Exercise and good sleep hygiene was emphasized.  She would benefit from water aerobics and is stretching exercises.  History of IBS-she has history of diarrhea alternating with constipation.  Other medical problems are listed as follows:  History of gastroesophageal reflux (GERD)  History of chronic cholecystitis  Hypothyroidism due to Hashimoto's  thyroiditis  Acne vulgaris  Interstitial cystitis  Hx of migraines  History of endometriosis  Anxiety and depression  Vitamin D deficiency  Orders: No orders of the defined types were placed in this encounter.  No orders of the defined types were placed in this encounter.   Follow-Up Instructions: Return if symptoms worsen or fail to improve, for MFP, +ANA.   Bo Merino, MD  Note - This record has been created using Editor, commissioning.  Chart creation errors have been sought, but may not always  have  been located. Such creation errors do not reflect on  the standard of medical care.

## 2020-07-16 ENCOUNTER — Other Ambulatory Visit: Payer: Self-pay

## 2020-07-16 ENCOUNTER — Encounter: Payer: Self-pay | Admitting: Rheumatology

## 2020-07-16 ENCOUNTER — Ambulatory Visit (INDEPENDENT_AMBULATORY_CARE_PROVIDER_SITE_OTHER): Payer: BC Managed Care – PPO | Admitting: Rheumatology

## 2020-07-16 VITALS — BP 119/69 | HR 93 | Resp 13 | Ht 69.0 in | Wt 161.8 lb

## 2020-07-16 DIAGNOSIS — F329 Major depressive disorder, single episode, unspecified: Secondary | ICD-10-CM

## 2020-07-16 DIAGNOSIS — M7918 Myalgia, other site: Secondary | ICD-10-CM | POA: Diagnosis not present

## 2020-07-16 DIAGNOSIS — M79641 Pain in right hand: Secondary | ICD-10-CM | POA: Diagnosis not present

## 2020-07-16 DIAGNOSIS — Z8742 Personal history of other diseases of the female genital tract: Secondary | ICD-10-CM

## 2020-07-16 DIAGNOSIS — Z8669 Personal history of other diseases of the nervous system and sense organs: Secondary | ICD-10-CM

## 2020-07-16 DIAGNOSIS — R768 Other specified abnormal immunological findings in serum: Secondary | ICD-10-CM | POA: Diagnosis not present

## 2020-07-16 DIAGNOSIS — M79642 Pain in left hand: Secondary | ICD-10-CM

## 2020-07-16 DIAGNOSIS — M25562 Pain in left knee: Secondary | ICD-10-CM

## 2020-07-16 DIAGNOSIS — M25561 Pain in right knee: Secondary | ICD-10-CM | POA: Diagnosis not present

## 2020-07-16 DIAGNOSIS — E063 Autoimmune thyroiditis: Secondary | ICD-10-CM

## 2020-07-16 DIAGNOSIS — E559 Vitamin D deficiency, unspecified: Secondary | ICD-10-CM

## 2020-07-16 DIAGNOSIS — F419 Anxiety disorder, unspecified: Secondary | ICD-10-CM

## 2020-07-16 DIAGNOSIS — N3010 Interstitial cystitis (chronic) without hematuria: Secondary | ICD-10-CM

## 2020-07-16 DIAGNOSIS — E038 Other specified hypothyroidism: Secondary | ICD-10-CM

## 2020-07-16 DIAGNOSIS — G8929 Other chronic pain: Secondary | ICD-10-CM

## 2020-07-16 DIAGNOSIS — L700 Acne vulgaris: Secondary | ICD-10-CM

## 2020-07-16 DIAGNOSIS — Z8719 Personal history of other diseases of the digestive system: Secondary | ICD-10-CM

## 2020-07-17 ENCOUNTER — Ambulatory Visit: Payer: BC Managed Care – PPO | Admitting: Psychology

## 2020-08-06 ENCOUNTER — Other Ambulatory Visit: Payer: BC Managed Care – PPO

## 2020-08-19 ENCOUNTER — Encounter: Payer: Self-pay | Admitting: Physician Assistant

## 2020-08-19 NOTE — Telephone Encounter (Signed)
Pt returned call and I have her being triaged currently.

## 2020-08-19 NOTE — Telephone Encounter (Signed)
LVM for patient to call the office back. 

## 2020-08-20 ENCOUNTER — Telehealth: Payer: Self-pay | Admitting: Physician Assistant

## 2020-08-20 NOTE — Telephone Encounter (Signed)
Reason for Call Symptomatic / Request for Health Information Initial Comment Caller states she has been experiencing headaches and numbness in her arms and legs. Caller states she lost vision as well. Caller states she had experiencing those symptoms for 15 to 20 minutes. Caller states she is not currently experiencing symptoms, but still concerned. [place] Not Listed [place], ED Translation No Nurse Assessment Nurse: Rolin Barry, RN, Levada Dy Date/Time (Eastern Time): 08/19/2020 2:47:43 PM Confirm and document reason for call. If symptomatic, describe symptoms. ---Caller states she has been experiencing headaches and numbness in her arms and legs. Caller states she lost vision as well. Caller states she had experiencing those symptoms for 15 to 20 minutes. Caller states she is not currently experiencing symptoms, but still concerned. Stated that it happened when she was 16 with a migraine. Has the patient had close contact with a person known or suspected to have the novel coronavirus illness OR traveled / lives in area with major community spread (including international travel) in the last 14 days from the onset of symptoms? * If Asymptomatic, screen for exposure and travel within the last 14 days. ---No Does the patient have any new or worsening symptoms? ---Yes Will a triage be completed? ---Yes Related visit to physician within the last 2 weeks? ---No Does the PT have any chronic conditions? (i.e. diabetes, asthma, this includes High risk factors for pregnancy, etc.) ---Yes List chronic conditions. ---migraines hosimotois Is the patient pregnant or possibly pregnant? (Ask all females between the ages of 29-55) ---No Is this a behavioral health or substance abuse call? ---NoPLEASE NOTE: All timestamps contained within this report are represented as [place] Standard Time. CONFIDENTIALTY NOTICE: This fax transmission is intended only for the addressee. It contains information  that is legally privileged, confidential or otherwise protected from use or disclosure. If you are not the intended recipient, you are strictly prohibited from reviewing, disclosing, copying using or disseminating any of this information or taking any action in reliance on or regarding this information. If you have received this fax in error, please notify us immediately by telephone so that we can arrange for its return to [place]. Phone: [phone], Toll-Free: [phone], Fax: [phone] Page: 2 of 2 Call Id: [id number] Guidelines Guideline Title Affirmed Question Affirmed Notes Nurse Date/Time Eilene Ghazi Time) Headache Severe pain in one eye Deaton, RN, Levada Dy 08/19/2020 2:49:04 PM Disp. Time Eilene Ghazi Time) Disposition Final User 08/19/2020 2:41:04 PM Attempt made - no message left Deaton, RN, Levada Dy 08/19/2020 2:51:11 PM Go to ED Now Yes Deaton, RN, Cindee Lame Disagree/Comply Comply Caller Understands Yes PreDisposition Did not know what to do Care Advice Given Per Guideline GO TO ED NOW: * You need to be seen in the Emergency Department. * Go to the ED at ___________ [place] now. Drive carefully. ANOTHER ADULT SHOULD DRIVE: * It is better and safer if another adult drives instead of you. CARE ADVICE given per Headache (Adult) guideline. Referrals GO TO FACILITY OTHER - SPECIFY

## 2020-08-20 NOTE — Telephone Encounter (Signed)
Left message on voicemail to call office.  

## 2020-08-21 ENCOUNTER — Ambulatory Visit: Payer: BC Managed Care – PPO | Admitting: Allergy

## 2020-10-09 ENCOUNTER — Ambulatory Visit: Payer: BC Managed Care – PPO | Admitting: Physician Assistant

## 2020-11-03 ENCOUNTER — Telehealth: Payer: Self-pay

## 2020-11-03 ENCOUNTER — Encounter: Payer: Self-pay | Admitting: Family Medicine

## 2020-11-03 ENCOUNTER — Ambulatory Visit: Payer: BC Managed Care – PPO | Admitting: Family Medicine

## 2020-11-03 ENCOUNTER — Other Ambulatory Visit: Payer: Self-pay

## 2020-11-03 VITALS — BP 107/72 | HR 73 | Temp 98.0°F | Ht 69.0 in | Wt 158.0 lb

## 2020-11-03 DIAGNOSIS — E063 Autoimmune thyroiditis: Secondary | ICD-10-CM | POA: Diagnosis not present

## 2020-11-03 DIAGNOSIS — G43509 Persistent migraine aura without cerebral infarction, not intractable, without status migrainosus: Secondary | ICD-10-CM

## 2020-11-03 DIAGNOSIS — E038 Other specified hypothyroidism: Secondary | ICD-10-CM | POA: Diagnosis not present

## 2020-11-03 DIAGNOSIS — H8110 Benign paroxysmal vertigo, unspecified ear: Secondary | ICD-10-CM | POA: Diagnosis not present

## 2020-11-03 MED ORDER — MECLIZINE HCL 25 MG PO TABS: 25.0000 mg | ORAL_TABLET | Freq: Three times a day (TID) | ORAL | 0 refills | Status: DC | PRN

## 2020-11-03 MED ORDER — KETOROLAC TROMETHAMINE 60 MG/2ML IM SOLN
60.0000 mg | Freq: Once | INTRAMUSCULAR | Status: AC
  Administered 2020-11-03: 60 mg via INTRAMUSCULAR

## 2020-11-03 MED ORDER — PROMETHAZINE HCL 25 MG/ML IJ SOLN
25.0000 mg | Freq: Once | INTRAMUSCULAR | Status: AC
  Administered 2020-11-03: 25 mg via INTRAMUSCULAR

## 2020-11-03 NOTE — Patient Instructions (Signed)
Please return to see your pcp Gwynneth Munson, PA) to discuss migraine management and recheck your thyroid levels within the next 2-4 weeks.   If you have any questions or concerns, please don't hesitate to send me a message via MyChart or call the office at [phone]. Thank you for visiting with [place] today! It's our pleasure caring for you.   Migraine Headache A migraine headache is an intense, throbbing pain on one side or both sides of the head. Migraine headaches may also cause other symptoms, such as nausea, vomiting, and sensitivity to light and noise. A migraine headache can last from 4 hours to 3 days. Talk with your doctor about what things may bring on (trigger) your migraine headaches. What are the causes? The exact cause of this condition is not known. However, a migraine may be caused when nerves in the brain become irritated and release chemicals that cause inflammation of blood vessels. This inflammation causes pain. This condition may be triggered or caused by:  Drinking alcohol.  Smoking.  Taking medicines, such as: ? Medicine used to treat chest pain (nitroglycerin). ? Birth control pills. ? Estrogen. ? Certain blood pressure medicines.  Eating or drinking products that contain nitrates, glutamate, aspartame, or tyramine. Aged cheeses, chocolate, or caffeine may also be triggers.  Doing physical activity. Other things that may trigger a migraine headache include:  Menstruation.  Pregnancy.  Hunger.  Stress.  Lack of sleep or too much sleep.  Weather changes.  Fatigue. What increases the risk? The following factors may make you more likely to experience migraine headaches:  Being a certain age. This condition is more common in people who are 7-22 years old.  Being female.  Having a family history of migraine headaches.  Being Caucasian.  Having a mental health condition, such as depression or anxiety.  Being obese. What are the signs or symptoms? The  main symptom of this condition is pulsating or throbbing pain. This pain may:  Happen in any area of the head, such as on one side or both sides.  Interfere with daily activities.  Get worse with physical activity.  Get worse with exposure to bright lights or loud noises. Other symptoms may include:  Nausea.  Vomiting.  Dizziness.  General sensitivity to bright lights, loud noises, or smells. Before you get a migraine headache, you may get warning signs (an aura). An aura may include:  Seeing flashing lights or having blind spots.  Seeing bright spots, halos, or zigzag lines.  Having tunnel vision or blurred vision.  Having numbness or a tingling feeling.  Having trouble talking.  Having muscle weakness. Some people have symptoms after a migraine headache (postdromal phase), such as:  Feeling tired.  Difficulty concentrating. How is this diagnosed? A migraine headache can be diagnosed based on:  Your symptoms.  A physical exam.  Tests, such as: ? CT scan or an MRI of the head. These imaging tests can help rule out other causes of headaches. ? Taking fluid from the spine (lumbar puncture) and analyzing it (cerebrospinal fluid analysis, or CSF analysis). How is this treated? This condition may be treated with medicines that:  Relieve pain.  Relieve nausea.  Prevent migraine headaches. Treatment for this condition may also include:  Acupuncture.  Lifestyle changes like avoiding foods that trigger migraine headaches.  Biofeedback.  Cognitive behavioral therapy. Follow these instructions at home: Medicines  Take over-the-counter and prescription medicines only as told by your health care provider.  Ask your health care  provider if the medicine prescribed to you: ? Requires you to avoid driving or using heavy machinery. ? Can cause constipation. You may need to take these actions to prevent or treat constipation:  Drink enough fluid to keep your urine  pale yellow.  Take over-the-counter or prescription medicines.  Eat foods that are high in fiber, such as beans, whole grains, and fresh fruits and vegetables.  Limit foods that are high in fat and processed sugars, such as fried or sweet foods. Lifestyle  Do not drink alcohol.  Do not use any products that contain nicotine or tobacco, such as cigarettes, e-cigarettes, and chewing tobacco. If you need help quitting, ask your health care provider.  Get at least 8 hours of sleep every night.  Find ways to manage stress, such as meditation, deep breathing, or yoga. General instructions      Keep a journal to find out what may trigger your migraine headaches. For example, write down: ? What you eat and drink. ? How much sleep you get. ? Any change to your diet or medicines.  If you have a migraine headache: ? Avoid things that make your symptoms worse, such as bright lights. ? It may help to lie down in a dark, quiet room. ? Do not drive or use heavy machinery. ? Ask your health care provider what activities are safe for you while you are experiencing symptoms.  Keep all follow-up visits as told by your health care provider. This is important. Contact a health care provider if:  You develop symptoms that are different or more severe than your usual migraine headache symptoms.  You have more than 15 headache days in one month. Get help right away if:  Your migraine headache becomes severe.  Your migraine headache lasts longer than 72 hours.  You have a fever.  You have a stiff neck.  You have vision loss.  Your muscles feel weak or like you cannot control them.  You start to lose your balance often.  You have trouble walking.  You faint.  You have a seizure. Summary  A migraine headache is an intense, throbbing pain on one side or both sides of the head. Migraines may also cause other symptoms, such as nausea, vomiting, and sensitivity to light and  noise.  This condition may be treated with medicines and lifestyle changes. You may also need to avoid certain things that trigger a migraine headache.  Keep a journal to find out what may trigger your migraine headaches.  Contact your health care provider if you have more than 15 headache days in a month or you develop symptoms that are different or more severe than your usual migraine headache symptoms. This information is not intended to replace advice given to you by your health care provider. Make sure you discuss any questions you have with your health care provider. Document Revised: 03/22/2019 Document Reviewed: 01/10/2019 Elsevier Patient Education  [place].

## 2020-11-03 NOTE — Telephone Encounter (Signed)
Nurse Assessment Nurse: Wynetta Emery, RN, Baker Janus Date/Time Eilene Ghazi Time): 11/03/2020 8:35:30 AM Confirm and document reason for call. If symptomatic, describe symptoms. ---Victoria Carson had vertigo and dizziness and nausea yesterday; woke up with left eye visual acuity not there seeing small spot in eye - and developed headache on right side of head. left eye sight is impaired has keep it closed. Also, notes she continues to have vertigo Does the patient have any new or worsening symptoms? ---Yes Will a triage be completed? ---Yes Related visit to physician within the last 2 weeks? ---No Does the PT have any chronic conditions? (i.e. diabetes, asthma, this includes High risk factors for pregnancy, etc.) ---Yes List chronic conditions. ---Aura Migraines Is the patient pregnant or possibly pregnant? (Ask all females between the ages of 34-55) ---No Is this a behavioral health or substance abuse call? ---No Guidelines Guideline Title Affirmed Question Affirmed Notes Nurse Date/Time (Eastern Time) Vision Loss or Change [1] Eye pain AND [2] brief (now gone) blurred vision or visual changes Ivin Booty 11/03/2020 8:39:11 AM Disp. Time Eilene Ghazi Time) Disposition Final User 11/03/2020 8:34:22 AM Send to Urgent August Albino, Kermit Balo PLEASE NOTE: All timestamps contained within this report are represented as [place] Standard Time. CONFIDENTIALTY NOTICE: This fax transmission is intended only for the addressee. It contains information that is legally privileged, confidential or otherwise protected from use or disclosure. If you are not the intended recipient, you are strictly prohibited from reviewing, disclosing, copying using or disseminating any of this information or taking any action in reliance on or regarding this information. If you have received this fax in error, please notify us immediately by telephone so that we can arrange for its return to [place]. Phone: [phone], Toll-Free: [phone],  Fax: [phone] Page: 2 of 2 Call Id: [id number] 11/03/2020 8:42:13 AM See HCP within 4 Hours (or PCP triage) Yes Wynetta Emery, RN, Christin Bach Disagree/Comply Comply Caller Understands Yes PreDisposition Call Doctor Care Advice Given Per Guideline SEE HCP (OR PCP TRIAGE) WITHIN 4 HOURS: * You become worse CARE ADVICE given per Vision Loss or Change (Adult) guideline. Comments User: Michele Rockers, RN Date/Time Eilene Ghazi Time): 11/03/2020 8:44:07 AM RN NOTE; warm transferred to back line and they will try to get her in today. Referrals Warm transfer to backline

## 2020-11-03 NOTE — Progress Notes (Signed)
Subjective  CC:  Chief Complaint  Patient presents with  . Migraine    Pt says that her migraine started today. She also states that she had twitching in her right eye.  . Nausea    Started yesterday  . Dizziness    Started yesterday   Same day acute visit; PCP not available. New pt to me. Chart reviewed.   HPI: Victoria Carson is a 24 y.o. female who presents to the office today to address the problems listed above in the chief complaint.  23 year old female who reports history of migraines since childhood but more recently has had a few migraines that were related with aura: She describes geometric flashes of light in her eyes and occasional loss of vision in parts of her visual field with a unilateral throbbing headache associated with nausea afterwards.  The symptoms started this morning.  Seems to have been triggered by an episode of vertigo that started yesterday.  She describes symptoms of positional vertigo with dizziness with movement.  She denies double vision, dysarthria, paresis.  She has had nausea with dizziness and also nausea with her headache although she has not vomited.  She is taking Excedrin and Dramamine.  The Dramamine has helped decrease her dizziness today.  She describes photophobia and phonophobia.  This migraine is typical of her most recent migraine headaches.  She is not currently on any migraine abortive medications.  This is her second or third migraine headache in the last several months.  Hypothyroidism: Her medication dose was increased back in June and she is due for follow-up.  She denies symptoms of hyperthyroidism.   Assessment  1. Persistent migraine aura without cerebral infarction and without status migrainosus, not intractable   2. Benign paroxysmal positional vertigo, unspecified laterality   3. Hypothyroidism due to Hashimoto's thyroiditis      Plan   Migraine headache with aura and visual field defects: Symptoms are typical.  Treated  with Toradol and Phenergan in the office.  Headache symptoms did decrease while she is here.  She tolerated it well.  No red flag symptoms present.  Education given.  Will need follow-up with primary care doctor to help manage her acute migraines and see if she is not a candidate for preventatives.  May also consider brain MRI given her visual field defects.  Patient to schedule  Benign positional vertigo: Appears to be the trigger for migraine.  Reassured.  Antivert as needed.  Discussed red flag symptoms.  Follow-up if worsening.  Return for visit for TSH recheck in 2 to 4 weeks.  Follow up: 2 to 4 weeks follow-up with PCP for thyroid follow-up and migraine evaluation Visit date not found  No orders of the defined types were placed in this encounter.  Meds ordered this encounter  Medications  . meclizine (ANTIVERT) 25 MG tablet    Sig: Take 1 tablet (25 mg total) by mouth 3 (three) times daily as needed for dizziness.    Dispense:  30 tablet    Refill:  0  . ketorolac (TORADOL) injection 60 mg  . promethazine (PHENERGAN) injection 25 mg      I reviewed the patients updated PMH, FH, and SocHx.    Patient Active Problem List   Diagnosis Date Noted  . Dyspareunia in female 11/19/2019  . Knee pain, bilateral 10/03/2019  . Change in bowel habits 08/22/2019  . Chest pain 07/31/2019  . Vitamin D deficiency 07/31/2019  . Lower extremity weakness 06/05/2019  .  Palpitations 03/12/2019  . Hypothyroidism due to Hashimoto's thyroiditis 01/24/2019  . Fatigue 07/06/2018  . Acne vulgaris 10/25/2017  . Interstitial cystitis 10/17/2016  . Encounter for birth control 10/17/2016  . MDD (major depressive disorder), recurrent episode, severe ([place]) 02/15/2013  . GAD (generalized anxiety disorder) 02/15/2013   Current Meds  Medication Sig  . albuterol (VENTOLIN HFA) 108 (90 Base) MCG/ACT inhaler 2 puffs every 4-6 hours as needed  . EPINEPHrine 0.3 mg/0.3 mL IJ SOAJ injection Inject 0.3 mLs (0.3  mg total) into the muscle as needed for anaphylaxis.  Marland Kitchen SYNTHROID 112 MCG tablet Take 1 tablet (112 mcg total) by mouth daily before breakfast.    Allergies: Patient is allergic to citrus and sulfa antibiotics. Family History: Patient family history includes Anxiety disorder in her paternal aunt; Depression in her paternal aunt; Drug abuse in her mother; Healthy in her brother, father, and sister; Heart failure in an other family member; Hypertension in an other family member; Liver disease in her maternal grandmother; Mental illness in her mother; Stroke in her paternal grandfather. Social History:  Patient  reports that she has never smoked. She has never used smokeless tobacco. She reports current alcohol use. She reports that she does not use drugs.  Review of Systems: Constitutional: Negative for fever malaise or anorexia Cardiovascular: negative for chest pain Respiratory: negative for SOB or persistent cough Gastrointestinal: negative for abdominal pain  Objective  Vitals: Temp 98 F (36.7 C) (Temporal)   Ht 5\' 9"  (1.753 m)   Wt 158 lb (71.7 kg)   BMI 23.33 kg/m  General: Sitting in darkened room, A&Ox3, nontoxic Neuro: Cranial nerves II through XII intact, no nystagmus, normal speech.  Normal cognition.  Normal gait HEENT: PEERLA, conjunctiva normal, neck is supple Cardiovascular:  RRR without murmur or gallop.  Respiratory:  Good breath sounds bilaterally, CTAB with normal respiratory effort Skin:  Warm, no rashes   Toradol 60 mg IM and Phenergan 25 mg IM given in office.  Patient tolerated well.  Improved headache symptoms.  Commons side effects, risks, benefits, and alternatives for medications and treatment plan prescribed today were discussed, and the patient expressed understanding of the given instructions. Patient is instructed to call or message via MyChart if he/she has any questions or concerns regarding our treatment plan. No barriers to understanding were  identified. We discussed Red Flag symptoms and signs in detail. Patient expressed understanding regarding what to do in case of urgent or emergency type symptoms.   Medication list was reconciled, printed and provided to the patient in AVS. Patient instructions and summary information was reviewed with the patient as documented in the AVS. This note was prepared with assistance of Dragon voice recognition software. Occasional wrong-word or sound-a-like substitutions may have occurred due to the inherent limitations of voice recognition software  This visit occurred during the SARS-CoV-2 public health emergency.  Safety protocols were in place, including screening questions prior to the visit, additional usage of staff PPE, and extensive cleaning of exam room while observing appropriate contact time as indicated for disinfecting solutions.

## 2020-11-03 NOTE — Telephone Encounter (Signed)
Pt seeing Dr. Jonni Sanger today.

## 2020-11-04 ENCOUNTER — Ambulatory Visit: Payer: BC Managed Care – PPO | Admitting: Physician Assistant

## 2020-11-27 ENCOUNTER — Ambulatory Visit: Payer: BC Managed Care – PPO | Admitting: Physician Assistant

## 2020-11-27 DIAGNOSIS — Z0289 Encounter for other administrative examinations: Secondary | ICD-10-CM

## 2020-12-02 DIAGNOSIS — Z20828 Contact with and (suspected) exposure to other viral communicable diseases: Secondary | ICD-10-CM | POA: Diagnosis not present

## 2020-12-06 DIAGNOSIS — Z20822 Contact with and (suspected) exposure to covid-19: Secondary | ICD-10-CM | POA: Diagnosis not present

## 2020-12-06 DIAGNOSIS — Z03818 Encounter for observation for suspected exposure to other biological agents ruled out: Secondary | ICD-10-CM | POA: Diagnosis not present

## 2020-12-09 DIAGNOSIS — R059 Cough, unspecified: Secondary | ICD-10-CM | POA: Diagnosis not present

## 2020-12-14 DIAGNOSIS — R059 Cough, unspecified: Secondary | ICD-10-CM | POA: Diagnosis not present

## 2021-02-19 ENCOUNTER — Ambulatory Visit: Payer: BC Managed Care – PPO | Admitting: Physician Assistant

## 2021-02-19 ENCOUNTER — Other Ambulatory Visit: Payer: Self-pay

## 2021-02-19 ENCOUNTER — Ambulatory Visit (INDEPENDENT_AMBULATORY_CARE_PROVIDER_SITE_OTHER): Payer: BC Managed Care – PPO

## 2021-02-19 ENCOUNTER — Encounter: Payer: Self-pay | Admitting: Physician Assistant

## 2021-02-19 VITALS — BP 100/68 | HR 71 | Temp 98.3°F | Ht 69.0 in | Wt 156.0 lb

## 2021-02-19 DIAGNOSIS — E063 Autoimmune thyroiditis: Secondary | ICD-10-CM

## 2021-02-19 DIAGNOSIS — Z1159 Encounter for screening for other viral diseases: Secondary | ICD-10-CM

## 2021-02-19 DIAGNOSIS — E559 Vitamin D deficiency, unspecified: Secondary | ICD-10-CM | POA: Diagnosis not present

## 2021-02-19 DIAGNOSIS — E538 Deficiency of other specified B group vitamins: Secondary | ICD-10-CM

## 2021-02-19 DIAGNOSIS — E038 Other specified hypothyroidism: Secondary | ICD-10-CM

## 2021-02-19 DIAGNOSIS — R5383 Other fatigue: Secondary | ICD-10-CM

## 2021-02-19 DIAGNOSIS — S6991XA Unspecified injury of right wrist, hand and finger(s), initial encounter: Secondary | ICD-10-CM | POA: Diagnosis not present

## 2021-02-19 DIAGNOSIS — K582 Mixed irritable bowel syndrome: Secondary | ICD-10-CM

## 2021-02-19 LAB — COMPREHENSIVE METABOLIC PANEL
ALT: 8 U/L (ref 0–35)
AST: 11 U/L (ref 0–37)
Albumin: 4.2 g/dL (ref 3.5–5.2)
Alkaline Phosphatase: 79 U/L (ref 39–117)
BUN: 10 mg/dL (ref 6–23)
CO2: 26 mEq/L (ref 19–32)
Calcium: 9.4 mg/dL (ref 8.4–10.5)
Chloride: 107 mEq/L (ref 96–112)
Creatinine, Ser: 0.74 mg/dL (ref 0.40–1.20)
GFR: 113.72 mL/min (ref >60.00)
Glucose, Bld: 90 mg/dL (ref 70–99)
Potassium: 4.4 mEq/L (ref 3.5–5.1)
Sodium: 139 mEq/L (ref 135–145)
Total Bilirubin: 0.3 mg/dL (ref 0.2–1.2)
Total Protein: 6.8 g/dL (ref 6.0–8.3)

## 2021-02-19 LAB — VITAMIN D 25 HYDROXY (VIT D DEFICIENCY, FRACTURES): VITD: 21.36 ng/mL — ABNORMAL LOW (ref 30.00–100.00)

## 2021-02-19 LAB — CBC WITH DIFFERENTIAL/PLATELET
Basophils Absolute: 0 10*3/uL (ref 0.0–0.1)
Basophils Relative: 1 % (ref 0.0–3.0)
Eosinophils Absolute: 0.1 10*3/uL (ref 0.0–0.7)
Eosinophils Relative: 2.4 % (ref 0.0–5.0)
HCT: 38.3 % (ref 36.0–46.0)
Hemoglobin: 13.4 g/dL (ref 12.0–15.0)
Lymphocytes Relative: 35.3 % (ref 12.0–46.0)
Lymphs Abs: 1.4 10*3/uL (ref 0.7–4.0)
MCHC: 35 g/dL (ref 30.0–36.0)
MCV: 90.8 fl (ref 78.0–100.0)
Monocytes Absolute: 0.5 10*3/uL (ref 0.1–1.0)
Monocytes Relative: 11.5 % (ref 3.0–12.0)
Neutro Abs: 2 10*3/uL (ref 1.4–7.7)
Neutrophils Relative %: 49.8 % (ref 43.0–77.0)
Platelets: 243 10*3/uL (ref 150.0–400.0)
RBC: 4.22 Mil/uL (ref 3.87–5.11)
RDW: 13.1 % (ref 11.5–15.5)
WBC: 4 10*3/uL (ref 4.0–10.5)

## 2021-02-19 LAB — HEPATITIS C ANTIBODY
Hepatitis C Ab: NONREACTIVE
SIGNAL TO CUT-OFF: 0.01 (ref <1.00)

## 2021-02-19 LAB — VITAMIN B12: Vitamin B-12: 300 pg/mL (ref 211–911)

## 2021-02-19 LAB — TSH: TSH: 4.94 u[IU]/mL — ABNORMAL HIGH (ref 0.35–4.50)

## 2021-02-19 LAB — T4, FREE: Free T4: 0.69 ng/dL (ref 0.60–1.60)
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
## 2021-02-19 NOTE — Patient Instructions (Signed)
It was great to see you!  Update thyroid and blood work today. I will let you know results and plan when they have returned.  We will update your xray of your wrist.  Referral for Dr. Paulita Fujita will be placed as well.  Take care,  Inda Coke PA-C

## 2021-02-22 ENCOUNTER — Other Ambulatory Visit: Payer: Self-pay | Admitting: Physician Assistant

## 2021-02-22 DIAGNOSIS — E038 Other specified hypothyroidism: Secondary | ICD-10-CM

## 2021-02-22 MED ORDER — LEVOTHYROXINE SODIUM 25 MCG PO TABS: 25.0000 ug | ORAL_TABLET | Freq: Every day | ORAL | 1 refills | Status: DC

## 2021-02-25 NOTE — Telephone Encounter (Signed)
Noted  

## 2021-02-26 DIAGNOSIS — Z124 Encounter for screening for malignant neoplasm of cervix: Secondary | ICD-10-CM | POA: Diagnosis not present

## 2021-02-26 DIAGNOSIS — Z01419 Encounter for gynecological examination (general) (routine) without abnormal findings: Secondary | ICD-10-CM | POA: Diagnosis not present

## 2021-02-26 DIAGNOSIS — Z6823 Body mass index (BMI) 23.0-23.9, adult: Secondary | ICD-10-CM | POA: Diagnosis not present

## 2021-02-26 LAB — HM PAP SMEAR

## 2021-03-11 ENCOUNTER — Encounter: Payer: Self-pay | Admitting: Physician Assistant

## 2021-04-20 ENCOUNTER — Ambulatory Visit (HOSPITAL_COMMUNITY): Payer: BC Managed Care – PPO

## 2021-04-20 ENCOUNTER — Telehealth (INDEPENDENT_AMBULATORY_CARE_PROVIDER_SITE_OTHER): Payer: BC Managed Care – PPO | Admitting: Physician Assistant

## 2021-04-20 ENCOUNTER — Other Ambulatory Visit: Payer: Self-pay

## 2021-04-20 ENCOUNTER — Encounter: Payer: Self-pay | Admitting: Physician Assistant

## 2021-04-20 DIAGNOSIS — G4489 Other headache syndrome: Secondary | ICD-10-CM | POA: Diagnosis not present

## 2021-04-20 MED ORDER — SUMATRIPTAN SUCCINATE 25 MG PO TABS: 25.0000 mg | ORAL_TABLET | ORAL | 1 refills | Status: DC | PRN

## 2021-04-20 NOTE — Progress Notes (Signed)
Virtual Visit via Video   I connected with Victoria Carson on 04/20/21 at 11:30 AM EDT by a video enabled telemedicine application and verified that I am speaking with the correct person using two identifiers. Location patient: Home Location provider: [facility], Office Persons participating in the virtual visit: [place], Inda Coke PA-C  I discussed the limitations of evaluation and management by telemedicine and the availability of in person appointments. The patient expressed understanding and agreed to proceed.   Subjective:   HPI:   Migraines Long history of migraines but they have historically always been infrequent -- one every 1 to 1.5 years. Over the past year however, she has had at least 4 and they are increasing in intensity. Most recent migraine was yesterday.  She reports that yesterday her migraine started with her vision blurring and she lost peripheral vision, had appearance zig-zag lights. Bilateral arms went numb. Vision symptoms resolved after about 30 minutes and then she developed R-sided eye pain. Took excedrin migraine, pain lasted for about 5 hours total. Also had nausea and light sensitivity. She is unsure of her triggers for her migraines.  Denied: slurred speech, vision changes, confusion  Possible family history of migraines on maternal side. No current chance of pregnancy. States that she has strong family history of strokes on her father's side.  No LMP recorded. (Menstrual status: Irregular Periods).   ROS: See pertinent positives and negatives per HPI.  Patient Active Problem List   Diagnosis Date Noted  . Dyspareunia in female 11/19/2019  . Knee pain, bilateral 10/03/2019  . Change in bowel habits 08/22/2019  . Chest pain 07/31/2019  . Vitamin D deficiency 07/31/2019  . Lower extremity weakness 06/05/2019  . Palpitations 03/12/2019  . Hypothyroidism due to Hashimoto's thyroiditis 01/24/2019  . Fatigue 07/06/2018  . Acne  vulgaris 10/25/2017  . Interstitial cystitis 10/17/2016  . Encounter for birth control 10/17/2016  . MDD (major depressive disorder), recurrent episode, severe ([place]) 02/15/2013  . GAD (generalized anxiety disorder) 02/15/2013    Social History   Tobacco Use  . Smoking status: Never Smoker  . Smokeless tobacco: Never Used  Substance Use Topics  . Alcohol use: Yes    Comment: occassional    Current Outpatient Medications:  .  EPINEPHrine 0.3 mg/0.3 mL IJ SOAJ injection, Inject 0.3 mLs (0.3 mg total) into the muscle as needed for anaphylaxis., Disp: 2 each, Rfl: 1 .  levothyroxine (SYNTHROID) 25 MCG tablet, Take 1 tablet (25 mcg total) by mouth daily before breakfast., Disp: 30 tablet, Rfl: 1 .  SUMAtriptan (IMITREX) 25 MG tablet, Take 1 tablet (25 mg total) by mouth every 2 (two) hours as needed for migraine. May repeat in 2 hours if headache persists or recurs., Disp: 10 tablet, Rfl: 1  Allergies  Allergen Reactions  . Citrus Itching and Other (See Comments)    SWEATING  . Sulfa Antibiotics Nausea Only, Other (See Comments) and Nausea And Vomiting    FEVER    Objective:   VITALS: Per patient if applicable, see vitals. GENERAL: Alert, appears well and in no acute distress. HEENT: Atraumatic, conjunctiva clear, no obvious abnormalities on inspection of external nose and ears. NECK: Normal movements of the head and neck. CARDIOPULMONARY: No increased WOB. Speaking in clear sentences. I:E ratio WNL.  MS: Moves all visible extremities without noticeable abnormality. PSYCH: Pleasant and cooperative, well-groomed. Speech normal rate and rhythm. Affect is appropriate. Insight and judgement are appropriate. Attention is focused, linear, and appropriate.  NEURO: CN grossly intact. Oriented as arrived to appointment on time with no prompting. Moves both UE equally.  SKIN: No obvious lesions, wounds, erythema, or cyanosis noted on face or hands.  Assessment and Plan:   Charis was seen  today for migraine.  Diagnoses and all orders for this visit:  Other headache syndrome She is currently asymptomatic. Given worsening severity and frequency of migraine pain, as well as worsening concordant visual deficit symptoms with each episode, will obtain migraine for further evaluation. Also will order abortive imitrex 25 mg for her to trial. Consider neurology referral based on clinical response to treatment and/or MRI results. Worsening precautions advised in the interim. Stroke symptoms reviewed with patient. -     MR Brain Wo Contrast; Future  Other orders -     SUMAtriptan (IMITREX) 25 MG tablet; Take 1 tablet (25 mg total) by mouth every 2 (two) hours as needed for migraine. May repeat in 2 hours if headache persists or recurs.    I discussed the assessment and treatment plan with the patient. The patient was provided an opportunity to ask questions and all were answered. The patient agreed with the plan and demonstrated an understanding of the instructions.   The patient was advised to call back or seek an in-person evaluation if the symptoms worsen or if the condition fails to improve as anticipated.    [place], [place] 04/20/2021

## 2021-04-21 ENCOUNTER — Encounter: Payer: Self-pay | Admitting: Physician Assistant

## 2021-04-29 ENCOUNTER — Ambulatory Visit (HOSPITAL_COMMUNITY): Admission: RE | Admit: 2021-04-29 | Payer: BC Managed Care – PPO | Source: Ambulatory Visit

## 2021-12-10 ENCOUNTER — Encounter: Payer: Self-pay | Admitting: Physician Assistant

## 2021-12-10 ENCOUNTER — Other Ambulatory Visit: Payer: Self-pay

## 2021-12-10 ENCOUNTER — Ambulatory Visit (INDEPENDENT_AMBULATORY_CARE_PROVIDER_SITE_OTHER): Payer: Managed Care, Other (non HMO) | Admitting: Physician Assistant

## 2021-12-10 VITALS — BP 102/67 | HR 75 | Temp 98.3°F | Ht 69.0 in | Wt 144.6 lb

## 2021-12-10 DIAGNOSIS — E063 Autoimmune thyroiditis: Secondary | ICD-10-CM | POA: Diagnosis not present

## 2021-12-10 DIAGNOSIS — M545 Low back pain, unspecified: Secondary | ICD-10-CM

## 2021-12-10 DIAGNOSIS — E038 Other specified hypothyroidism: Secondary | ICD-10-CM | POA: Diagnosis not present

## 2021-12-10 DIAGNOSIS — Z3169 Encounter for other general counseling and advice on procreation: Secondary | ICD-10-CM | POA: Diagnosis not present

## 2021-12-10 MED ORDER — MELOXICAM 15 MG PO TABS: 15.0000 mg | ORAL_TABLET | Freq: Every day | ORAL | 0 refills | Status: DC

## 2021-12-10 NOTE — Patient Instructions (Signed)
It was great to see you!  For your back: Start mobic 15 mg daily x 1 week -- this will replace ibuprofen Continue icy hot patches Submit work note to have lighter duty Follow-up with me if worsens or does not get better  For your thyroid: Please schedule a lab appt for this I will be in touch via MyChart with recommendations  Please start a prenatal multivitamin  Take care,  Inda Coke PA-C

## 2021-12-10 NOTE — Progress Notes (Signed)
Victoria Carson is a 25 y.o. female here for a follow up of hypothyroidism.  History of Present Illness:   Chief Complaint  Patient presents with   Hashimoto's Thyroiditis    Wants to discuss plans for future pregnancy.    HPI  Hypothyroidism Victoria Carson has been non compliant with taking levothyroxine 25 mcg daily.  States that she can only tolerate synthroid due to levothyroxine causing nausea and vomiting. Currently she is unaware as to how long she has been without her medication. Denies hair loss, heart palpitations, insomnia.   Future Conception Victoria Carson has recently gotten married and expressed that she is not trying to conceive but say that if it happens she wants to be prepared. At this time she is concerned that her Hashimoto's disease could affect her ability to become pregnant or have a healthy pregnancy, especially while not on her medication. She is no longer taking her birth control but is still using condoms as her form of contraception.   Right Lower Back Pain Mrs. Victoria Carson reports she injured her back on Monday morning while walking and isn't sure what caused her pain. Pt describes the pain as being similar to pulling a muscle in her lower back. States that she didn't have any pain on Tuesday, but on Wednesday morning as she was bending down the pain had returned. Since then she has found it has gotten harder to bend over or complete certain tasks due to the pain. In an effort to manage her pain she has tried ibuprofen and icy hot patches which provided temporary relief. Denies urinary incontinence, concern for kidney infection, or numbness/tingling down legs.   Past Medical History:  Diagnosis Date   Chronic cholecystitis 07/2016   Cyst of right Bartholin's gland 05/2020   Dental crown present    Difficulty swallowing pills    Dust allergy    Eczema    GERD (gastroesophageal reflux disease)    no current med.   Hypothyroidism (acquired)    IBS (irritable bowel syndrome)     Colonoscopy 09/04/2019 x 2 precanerous polyps removed, repeat 3-5 yrs   Interstitial cystitis 2019   Migraines      Social History   Tobacco Use   Smoking status: Never   Smokeless tobacco: Never  Vaping Use   Vaping Use: Never used  Substance Use Topics   Alcohol use: Yes    Comment: occassional   Drug use: No    Past Surgical History:  Procedure Laterality Date   CHOLECYSTECTOMY N/A 08/08/2016   Procedure: LAPAROSCOPIC CHOLECYSTECTOMY;  Surgeon: Coralie Keens, MD;  Location: [facility];  Service: General;  Laterality: N/A;   WISDOM TOOTH EXTRACTION      Family History  Problem Relation Age of Onset   Mental illness Mother    Drug abuse Mother    Healthy Father    Healthy Sister    Healthy Brother    Heart failure Other    Hypertension Other    Depression Paternal Aunt    Anxiety disorder Paternal Aunt    Stroke Paternal Grandfather    Liver disease Maternal Grandmother    Colon polyps Neg Hx    Esophageal cancer Neg Hx    Stomach cancer Neg Hx    Rectal cancer Neg Hx    Allergic rhinitis Neg Hx    Angioedema Neg Hx    Asthma Neg Hx    Atopy Neg Hx    Eczema Neg Hx    Immunodeficiency Neg Hx  Urticaria Neg Hx     Allergies  Allergen Reactions   Citrus Itching and Other (See Comments)    SWEATING   Sulfa Antibiotics Nausea Only, Other (See Comments) and Nausea And Vomiting    FEVER    Current Medications:   Current Outpatient Medications:    EPINEPHrine 0.3 mg/0.3 mL IJ SOAJ injection, Inject 0.3 mLs (0.3 mg total) into the muscle as needed for anaphylaxis., Disp: 2 each, Rfl: 1   meloxicam (MOBIC) 15 MG tablet, Take 1 tablet (15 mg total) by mouth daily., Disp: 30 tablet, Rfl: 0   levothyroxine (SYNTHROID) 25 MCG tablet, Take 1 tablet (25 mcg total) by mouth daily before breakfast. (Patient not taking: Reported on 12/10/2021), Disp: 30 tablet, Rfl: 1   SUMAtriptan (IMITREX) 25 MG tablet, Take 1 tablet (25 mg total) by mouth every 2  (two) hours as needed for migraine. May repeat in 2 hours if headache persists or recurs. (Patient not taking: Reported on 12/10/2021), Disp: 10 tablet, Rfl: 1   Review of Systems:   ROS Negative unless otherwise specified per HPI. Vitals:   Vitals:   12/10/21 1330  BP: 102/67  Pulse: 75  Temp: 98.3 F (36.8 C)  TempSrc: Temporal  SpO2: 96%  Weight: 144 lb 9.6 oz (65.6 kg)  Height: 5\' 9"  (1.753 m)     Body mass index is 21.35 kg/m.  Physical Exam:   Physical Exam Vitals and nursing note reviewed.  Constitutional:      General: She is not in acute distress.    Appearance: She is well-developed. She is not ill-appearing or toxic-appearing.  Cardiovascular:     Rate and Rhythm: Normal rate and regular rhythm.     Pulses: Normal pulses.     Heart sounds: Normal heart sounds, S1 normal and S2 normal.  Pulmonary:     Effort: Pulmonary effort is normal.     Breath sounds: Normal breath sounds.  Musculoskeletal:     Lumbar back: Tenderness present. No bony tenderness.     Comments: Right lumbar paraspinal tenderness present   Skin:    General: Skin is warm and dry.  Neurological:     Mental Status: She is alert.     GCS: GCS eye subscore is 4. GCS verbal subscore is 5. GCS motor subscore is 6.  Psychiatric:        Speech: Speech normal.        Behavior: Behavior normal. Behavior is cooperative.    Assessment and Plan:   Right lower back pain without sciatica  No red flags on exam Suspect muscle spasm Start meloxicam 15 mg daily x 1 week May continue icy hot patches as needed Provided patient with work note Follow up if symptoms worsen or no improvement occurs  Hypothyroidism due to Hashimoto's thyroiditis Asymptomatic  Schedule to update labs, will start medication as indicated  Will reach out to patient via MyChart with recommendations  Preconception counseling Recommended starting prenatal MVI, need for compliance with thyroid medication, limiting alcohol,  maintaining healthy exercise/diet and weight, maintaining adequate mental health  I,Havlyn C Ratchford,acting as a scribe for [employer], PA.,have documented all relevant documentation on the behalf of Inda Coke, PA,as directed by  Inda Coke, PA while in the presence of Inda Coke, [place].  I, Inda Coke, [place], have reviewed all documentation for this visit. The documentation on 12/10/21 for the exam, diagnosis, procedures, and orders are all accurate and complete.  Inda Coke, PA-C

## 2021-12-13 ENCOUNTER — Ambulatory Visit (INDEPENDENT_AMBULATORY_CARE_PROVIDER_SITE_OTHER): Payer: Managed Care, Other (non HMO)

## 2021-12-13 ENCOUNTER — Ambulatory Visit (HOSPITAL_COMMUNITY)
Admission: EM | Admit: 2021-12-13 | Discharge: 2021-12-13 | Disposition: A | Discharge status: Home / Self Care | Payer: Managed Care, Other (non HMO) | Attending: Family Medicine | Admitting: Family Medicine

## 2021-12-13 ENCOUNTER — Other Ambulatory Visit: Payer: Self-pay

## 2021-12-13 DIAGNOSIS — M545 Low back pain, unspecified: Secondary | ICD-10-CM

## 2021-12-13 LAB — POC URINE PREG, ED: Preg Test, Ur: NEGATIVE

## 2021-12-13 MED ORDER — TIZANIDINE HCL 4 MG PO TABS: 4.0000 mg | ORAL_TABLET | Freq: Three times a day (TID) | ORAL | 0 refills | Status: DC | PRN

## 2021-12-13 NOTE — Discharge Instructions (Addendum)
Pregnancy test is negative.  Back xrays are negative for bony pathology Take tizanidine 4 mg every 8 hours as needed for muscle pain/spasm. Continue heat/heating pad.

## 2021-12-13 NOTE — ED Provider Notes (Addendum)
[facility]    CSN: [id number] Arrival date & time: 12/13/21  1400      History   Chief Complaint Chief Complaint  Patient presents with   Back Pain    HPI Victoria Carson is a 26 y.o. female.    Back Pain Here for low back pain that began about 5 days ago, and then has progressed since. Seen at her PCP office 12/30, and was rx'd meloxicam. States it is not helping. Was initially in right low back, and now is in right flank area and wraps around to her right abd some. No dysuria or hematuria. Not related to eating at all. Standing makes it worse; apparently bending does also, as she states she is afraid to bend down. No f/c/URI symptoms. LMP 2 weeks ago.  Past Medical History:  Diagnosis Date   Chronic cholecystitis 07/2016   Cyst of right Bartholin's gland 05/2020   Dental crown present    Difficulty swallowing pills    Dust allergy    Eczema    GERD (gastroesophageal reflux disease)    no current med.   Hypothyroidism (acquired)    IBS (irritable bowel syndrome)    Colonoscopy 09/04/2019 x 2 precanerous polyps removed, repeat 3-5 yrs   Interstitial cystitis 2019   Migraines     Patient Active Problem List   Diagnosis Date Noted   Dyspareunia in female 11/19/2019   Knee pain, bilateral 10/03/2019   Change in bowel habits 08/22/2019   Chest pain 07/31/2019   Vitamin D deficiency 07/31/2019   Lower extremity weakness 06/05/2019   Palpitations 03/12/2019   Hypothyroidism due to Hashimoto's thyroiditis 01/24/2019   Fatigue 07/06/2018   Acne vulgaris 10/25/2017   Interstitial cystitis 10/17/2016   Encounter for birth control 10/17/2016   MDD (major depressive disorder), recurrent episode, severe ([place]) 02/15/2013   GAD (generalized anxiety disorder) 02/15/2013    Past Surgical History:  Procedure Laterality Date   CHOLECYSTECTOMY N/A 08/08/2016   Procedure: LAPAROSCOPIC CHOLECYSTECTOMY;  Surgeon: Coralie Keens, MD;  Location: [facility];  Service: General;  Laterality: N/A;   WISDOM TOOTH EXTRACTION      OB History   No obstetric history on file.      Home Medications    Prior to Admission medications   Medication Sig Start Date End Date Taking? Authorizing Provider  tiZANidine (ZANAFLEX) 4 MG tablet Take 1 tablet (4 mg total) by mouth every 8 (eight) hours as needed for muscle spasms. 12/13/21  Yes Barrett Henle, MD  EPINEPHrine 0.3 mg/0.3 mL IJ SOAJ injection Inject 0.3 mLs (0.3 mg total) into the muscle as needed for anaphylaxis. 02/20/20   Orma Flaming, MD  levothyroxine (SYNTHROID) 25 MCG tablet Take 1 tablet (25 mcg total) by mouth daily before breakfast. Patient not taking: Reported on 12/10/2021 02/22/21   Inda Coke, PA  meloxicam (MOBIC) 15 MG tablet Take 1 tablet (15 mg total) by mouth daily. 12/10/21   Inda Coke, PA  SUMAtriptan (IMITREX) 25 MG tablet Take 1 tablet (25 mg total) by mouth every 2 (two) hours as needed for migraine. May repeat in 2 hours if headache persists or recurs. Patient not taking: Reported on 12/10/2021 04/20/21   Inda Coke, PA    Family History Family History  Problem Relation Age of Onset   Mental illness Mother    Drug abuse Mother    Healthy Father    Healthy Sister    Healthy Brother    Heart failure  Other    Hypertension Other    Depression Paternal Aunt    Anxiety disorder Paternal Aunt    Stroke Paternal Grandfather    Liver disease Maternal Grandmother    Colon polyps Neg Hx    Esophageal cancer Neg Hx    Stomach cancer Neg Hx    Rectal cancer Neg Hx    Allergic rhinitis Neg Hx    Angioedema Neg Hx    Asthma Neg Hx    Atopy Neg Hx    Eczema Neg Hx    Immunodeficiency Neg Hx    Urticaria Neg Hx     Social History Social History   Tobacco Use   Smoking status: Never   Smokeless tobacco: Never  Vaping Use   Vaping Use: Never used  Substance Use Topics   Alcohol use: Yes    Comment: occassional   Drug use: No      Allergies   Citrus and Sulfa antibiotics   Review of Systems Review of Systems  Musculoskeletal:  Positive for back pain.    Physical Exam Triage Vital Signs ED Triage Vitals  Enc Vitals Group     BP 12/13/21 1707 117/66     Pulse Rate 12/13/21 1707 (!) 56     Resp 12/13/21 1705 16     Temp 12/13/21 1705 98.1 F (36.7 C)     Temp Source 12/13/21 1705 Oral     SpO2 12/13/21 1705 100 %     Weight --      Height --      Head Circumference --      Peak Flow --      Pain Score 12/13/21 1706 9     Pain Loc --      Pain Edu? --      Excl. in [place]? --    No data found.  Updated Vital Signs BP 117/66 (BP Location: Left Arm)    Pulse (!) 56    Temp 98.1 F (36.7 C) (Oral)    Resp 16    LMP 11/30/2021    SpO2 100%   Visual Acuity Right Eye Distance:   Left Eye Distance:   Bilateral Distance:    Right Eye Near:   Left Eye Near:    Bilateral Near:     Physical Exam Vitals reviewed.  Constitutional:      General: She is not in acute distress.    Appearance: She is not toxic-appearing.  HENT:     Mouth/Throat:     Mouth: Mucous membranes are moist.  Cardiovascular:     Rate and Rhythm: Normal rate and regular rhythm.     Heart sounds: No murmur heard. Pulmonary:     Effort: Pulmonary effort is normal.     Breath sounds: Normal breath sounds.  Abdominal:     Tenderness: There is abdominal tenderness (mild tenderness in right periumbilical area and RUQ). There is no rebound.  Musculoskeletal:        General: Tenderness (tender over right LS area and upper right lumbar region. No rash) present.  Skin:    Capillary Refill: Capillary refill takes less than 2 seconds.     Coloration: Skin is not jaundiced or pale.  Neurological:     General: No focal deficit present.     Mental Status: She is alert and oriented to person, place, and time.  Psychiatric:        Behavior: Behavior normal.     UC Treatments / Results  Labs (  all labs ordered are listed, but only  abnormal results are displayed) Labs Reviewed  POC URINE PREG, ED    EKG   Radiology DG Lumbar Spine Complete  Result Date: 12/13/2021 CLINICAL DATA:  26 year old with low back pain. EXAM: LUMBAR SPINE - COMPLETE 4+ VIEW COMPARISON:  None. FINDINGS: There are 5 lumbar type vertebra. The alignment is maintained. Vertebral body heights are normal. There is no listhesis. The posterior elements are intact. Disc spaces are preserved. No fracture, focal bone abnormality or pars defects. Sacroiliac joints are symmetric and normal. IMPRESSION: Negative radiographs of the lumbar spine. Electronically Signed   By: Keith Rake M.D.   On: 12/13/2021 18:10    Procedures Procedures (including critical care time)  Medications Ordered in UC Medications - No data to display  Initial Impression / Assessment and Plan / UC Course  I have reviewed the triage vital signs and the nursing notes.  Pertinent labs & imaging results that were available during my care of the patient were reviewed by me and considered in my medical decision making (see chart for details).     UPT neg Xrays show no acute abnormality.  Final Clinical Impressions(s) / UC Diagnoses   Final diagnoses:  Acute right-sided low back pain without sciatica     Discharge Instructions      Pregnancy test is negative.  Back xrays are negative for bony pathology Take tizanidine 4 mg every 8 hours as needed for muscle pain/spasm. Continue heat/heating pad.     ED Prescriptions     Medication Sig Dispense Auth. Provider   tiZANidine (ZANAFLEX) 4 MG tablet Take 1 tablet (4 mg total) by mouth every 8 (eight) hours as needed for muscle spasms. 30 tablet Rosaelena Kemnitz, Gwenlyn Perking, MD      PDMP not reviewed this encounter.   Barrett Henle, MD 12/13/21 [place]    Barrett Henle, MD 12/13/21 [phone]

## 2021-12-14 ENCOUNTER — Telehealth: Payer: Self-pay

## 2021-12-14 ENCOUNTER — Other Ambulatory Visit (INDEPENDENT_AMBULATORY_CARE_PROVIDER_SITE_OTHER): Payer: Managed Care, Other (non HMO)

## 2021-12-14 DIAGNOSIS — E063 Autoimmune thyroiditis: Secondary | ICD-10-CM

## 2021-12-14 DIAGNOSIS — E038 Other specified hypothyroidism: Secondary | ICD-10-CM | POA: Diagnosis not present

## 2021-12-14 LAB — T4, FREE: Free T4: 0.73 ng/dL (ref 0.60–1.60)

## 2021-12-14 LAB — TSH: TSH: 4.36 u[IU]/mL (ref 0.35–5.50)

## 2021-12-14 NOTE — Telephone Encounter (Signed)
Left patient vm to call back if she needs appt.   Patient Name: Victoria Carson [facility] Gender: Female DOB: [birth year] Age: 26 Y 31 M 16 D Return Phone Number: [phone] (Primary) Address: City/ State/ Zip: [place]  [address] Client [facility] Client [facility] [employer] Inda Coke- [place] Contact Type Call Who Is Calling Patient / Member / Family / Caregiver Call Type Triage / Clinical Relationship To Patient Self Return Phone Number [phone] (Primary) Chief Complaint Back Pain - General Reason for Call Symptomatic / Request for Health Information Initial Comment Caller states she was seen on Friday for back pain. She was told to call back if it got worse. Back is now swelling, pain radiating across her stomach. Translation No Nurse Assessment Nurse: Robby Sermon, RN, April Date/Time Eilene Ghazi Time): 12/13/2021 9:33:28 AM Confirm and document reason for call. If symptomatic, describe symptoms. ---Caller states she was walking on last Monday and felt a "shift" and was uncomfortable. Wednesday went back to work. By Friday the discomfort became painful. Her back is swollen and pain is now spreading. Does the patient have any new or worsening symptoms? ---Yes Will a triage Carson completed? ---Yes Related visit to physician within the last 2 weeks? ---Yes Does the PT have any chronic conditions? (i.e. diabetes, asthma, this includes High risk factors for pregnancy, etc.) ---Yes List chronic conditions. ---Hashimoto's Is the patient pregnant or possibly pregnant? (Ask all females between the ages of 21-55) ---No Is this a behavioral health or substance abuse call? ---No Guidelines Guideline Title Affirmed Question Affirmed Notes Nurse Date/Time (Eastern Time) Back Pain [1] MODERATE back pain (e.g., interferes with normal activities) Robby Sermon, RN, April 12/13/2021 9:35:44 AM  Guidelines Guideline  Title Affirmed Question Affirmed Notes Nurse Date/Time (Eastern Time) AND [2] present > 3 days Disp. Time Eilene Ghazi Time) Disposition Final User 12/13/2021 9:39:19 AM SEE PCP WITHIN 3 DAYS Yes Robby Sermon, RN, April Caller Disagree/Comply Comply Caller Understands Yes PreDisposition Home Care Care Advice Given Per Guideline SEE PCP WITHIN 3 DAYS: USE HEAT: * Do this for 10 minutes three times a day. * If you sleep on your back, place a pillow under your knees. ACTIVITY: * Continue ordinary activities as much as your pain permits. Continued activity is more healing for the back than rest. CALL BACK IF: * Numbness or weakness occurs, or bowel/bladder problems * There are any urine symptoms or fever * You become worse Referrals REFERRED TO PCP OFFICE

## 2022-02-16 ENCOUNTER — Ambulatory Visit (INDEPENDENT_AMBULATORY_CARE_PROVIDER_SITE_OTHER): Payer: Self-pay | Admitting: Family

## 2022-02-16 ENCOUNTER — Encounter: Payer: Self-pay | Admitting: Family

## 2022-02-16 VITALS — BP 97/62 | HR 88 | Temp 98.3°F | Ht 69.0 in | Wt 143.0 lb

## 2022-02-16 DIAGNOSIS — G4489 Other headache syndrome: Secondary | ICD-10-CM

## 2022-02-16 MED ORDER — NAPROXEN 500 MG PO TABS: 500.0000 mg | ORAL_TABLET | Freq: Every day | ORAL | 0 refills | Status: DC | PRN

## 2022-02-16 MED ORDER — SUMATRIPTAN SUCCINATE 25 MG PO TABS: 25.0000 mg | ORAL_TABLET | ORAL | 1 refills | Status: DC | PRN

## 2022-02-16 MED ORDER — KETOROLAC TROMETHAMINE 60 MG/2ML IM SOLN
60.0000 mg | Freq: Once | INTRAMUSCULAR | Status: AC
  Administered 2022-02-16: 60 mg via INTRAMUSCULAR

## 2022-02-16 NOTE — Patient Instructions (Addendum)
It was very nice to see you today! ? ?We gave you a Toradol shot today (anti-inflammatory) to help with your current migraine. Go home and rest for a few hours to allow pain to recede, but this med should not make you drowsy. ?I resent the Sumatriptan and Naproxen to take if headache continues or for next time you start to have pain. ?Contact your eye doctor and have them do a full exam to rule out any other cause for your migraines. ? ? ?PLEASE NOTE: ? ?If you had any lab tests please let us know if you have not heard back within a few days. You may see your results on MyChart before we have a chance to review them but we will give you a call once they are reviewed by [place]. If we ordered any referrals today, please let us know if you have not heard from their office within the next week.  ? ?Please try these tips to maintain a healthy lifestyle: ? ?Eat most of your calories during the day when you are active. Eliminate processed foods including packaged sweets (pies, cakes, cookies), reduce intake of potatoes, white bread, white pasta, and white rice. Look for whole grain options, oat flour or almond flour. ? ?Each meal should contain half fruits/vegetables, one quarter protein, and one quarter carbs (no bigger than a computer mouse). ? ?Cut down on sweet beverages. This includes juice, soda, and sweet tea. Also watch fruit intake, though this is a healthier sweet option, it still contains natural sugar! Limit to 3 servings daily. ? ?Drink at least 1 glass of water with each meal and aim for at least 8 glasses per day ? ?Exercise at least 150 minutes every week.  ? ?

## 2022-02-16 NOTE — Progress Notes (Signed)
? ?Subjective:  ? ? ? Patient ID: Victoria Carson, female    DOB: 1996-03-05, 26 y.o.   MRN: [id number] ? ?Chief Complaint  ?Patient presents with  ? Migraine  ?  Pt c/o migraines since Friday off and on and she lost eyesight in the right eye and migraine was over he left eye.   ? ? ?HPI: ?Headache: Patient complains of headache. She does have a headache at this time.  ?Location of pain: retro-orbital Radiation of pain?:none. Character of pain:stabbing and throbbing. Accompanying symptoms: nausea Prodromal sx?: diplopia, vertigo. Rapidity of onset: gradual. Typical duration of individual headache: 3 days. Are most headaches similar in presentation? yes Typical precipitants: unknown. Started having HAs  years ago. Worst time of day: random. Abortive meds? acetaminophen. Daily use? yes - 3 days. Prophylactic meds? no. History of head/neck trauma? no. Use of meds that might worsen HAs? no. Exposure to carbon monoxide? no. Caffeine use: minimal. ? ? ?Health Maintenance Due  ?Topic Date Due  ? COVID-19 Vaccine (3 - Booster for Pfizer series) 06/13/2020  ? ? ?Past Medical History:  ?Diagnosis Date  ? Chronic cholecystitis 07/2016  ? Cyst of right Bartholin's gland 05/2020  ? Dental crown present   ? Difficulty swallowing pills   ? Dust allergy   ? Eczema   ? GERD (gastroesophageal reflux disease)   ? no current med.  ? Hypothyroidism (acquired)   ? IBS (irritable bowel syndrome)   ? Colonoscopy 09/04/2019 x 2 precanerous polyps removed, repeat 3-5 yrs  ? Interstitial cystitis 2019  ? Migraines   ? ? ?Past Surgical History:  ?Procedure Laterality Date  ? CHOLECYSTECTOMY N/A 08/08/2016  ? Procedure: LAPAROSCOPIC CHOLECYSTECTOMY;  Surgeon: Coralie Keens, MD;  Location: [facility];  Service: General;  Laterality: N/A;  ? WISDOM TOOTH EXTRACTION    ? ? ?Outpatient Medications Prior to Visit  ?Medication Sig Dispense Refill  ? EPINEPHrine 0.3 mg/0.3 mL IJ SOAJ injection Inject 0.3 mLs (0.3 mg total) into the  muscle as needed for anaphylaxis. 2 each 1  ? meloxicam (MOBIC) 15 MG tablet Take 1 tablet (15 mg total) by mouth daily. 30 tablet 0  ? SUMAtriptan (IMITREX) 25 MG tablet Take 1 tablet (25 mg total) by mouth every 2 (two) hours as needed for migraine. May repeat in 2 hours if headache persists or recurs. 10 tablet 1  ? tiZANidine (ZANAFLEX) 4 MG tablet Take 1 tablet (4 mg total) by mouth every 8 (eight) hours as needed for muscle spasms. 30 tablet 0  ? levothyroxine (SYNTHROID) 25 MCG tablet Take 1 tablet (25 mcg total) by mouth daily before breakfast. (Patient not taking: Reported on 02/16/2022) 30 tablet 1  ? ?No facility-administered medications prior to visit.  ? ? ?Allergies  ?Allergen Reactions  ? Citrus Itching and Other (See Comments)  ?  SWEATING  ? Sulfa Antibiotics Nausea Only, Other (See Comments) and Nausea And Vomiting  ?  FEVER  ? ? ? ?   ?Objective:  ?  ?Physical Exam ?Vitals and nursing note reviewed.  ?Constitutional:   ?   Appearance: Normal appearance.  ?Eyes:  ?   Extraocular Movements: Extraocular movements intact.  ?   Conjunctiva/sclera: Conjunctivae normal.  ?   Comments: pt squinting both eyes, but no anomaly seen within the eyes  ?Cardiovascular:  ?   Rate and Rhythm: Normal rate and regular rhythm.  ?Pulmonary:  ?   Effort: Pulmonary effort is normal.  ?   Breath sounds: Normal  breath sounds.  ?Musculoskeletal:     ?   General: Normal range of motion.  ?Skin: ?   General: Skin is warm and dry.  ?Neurological:  ?   Mental Status: She is alert.  ?Psychiatric:     ?   Mood and Affect: Mood normal.     ?   Behavior: Behavior normal.  ? ? ?BP 97/62 (BP Location: Left Arm, Patient Position: Sitting, Cuff Size: Normal)   Pulse 88   Temp 98.3 ?F (36.8 ?C) (Temporal)   Ht '5\' 9"'$  (1.753 m)   Wt 143 lb (64.9 kg)   LMP 01/26/2022 (Approximate)   SpO2 97%   BMI 21.12 kg/m?  ?Wt Readings from Last 3 Encounters:  ?02/16/22 143 lb (64.9 kg)  ?12/10/21 144 lb 9.6 oz (65.6 kg)  ?02/19/21 156 lb (70.8  kg)  ? ? ?   ?Assessment & Plan:  ? ?Problem List Items Addressed This Visit   ?None ?Visit Diagnoses   ? ? Other headache syndrome    -  Primary  ? reports last one 4 months ago, presented same way with vision loss in opposite eye of pain, feels pain behind the eye. Toradol shot given today, refilling Imitrex as pt never picked up last time, and also sending Naproxen to take with it, pt advised on use & SE. ? ?Relevant Medications  ? ketorolac (TORADOL) injection 60 mg (Start on 02/16/2022 10:15 AM)  ? SUMAtriptan (IMITREX) 25 MG tablet  ? naproxen (NAPROSYN) 500 MG tablet  ? ?  ? ? ?Meds ordered this encounter  ?Medications  ? ketorolac (TORADOL) injection 60 mg  ? SUMAtriptan (IMITREX) 25 MG tablet  ?  Sig: Take 1-2 tablets (25-50 mg total) by mouth every 2 (two) hours as needed for migraine. Max dose of 4 tabs in 24 hours.  ?  Dispense:  10 tablet  ?  Refill:  1  ?  Order Specific Question:   Supervising Provider  ?  Answer:   ANDY, CAMILLE L [[id number]]  ? naproxen (NAPROSYN) 500 MG tablet  ?  Sig: Take 1 tablet (500 mg total) by mouth daily as needed. Take one time with Sumatriptan at onset of headache.  ?  Dispense:  10 tablet  ?  Refill:  0  ?  Order Specific Question:   Supervising Provider  ?  Answer:   ANDY, CAMILLE L [[id number]]  ? ? ?Jeanie Sewer, NP ? ?

## 2022-02-28 ENCOUNTER — Ambulatory Visit: Payer: Self-pay | Admitting: Physician Assistant

## 2022-02-28 NOTE — Progress Notes (Incomplete)
Victoria Carson is a 26 y.o. female here for a follow up of migraines. ? ?SCRIBE STATEMENT ? ?History of Present Illness:  ? ?No chief complaint on file. ? ? ?HPI ?Migraines  ?Victoria Carson presents with  ?Past Medical History:  ?Diagnosis Date  ? Chronic cholecystitis 07/2016  ? Cyst of right Bartholin's gland 05/2020  ? Dental crown present   ? Difficulty swallowing pills   ? Dust allergy   ? Eczema   ? GERD (gastroesophageal reflux disease)   ? no current med.  ? Hypothyroidism (acquired)   ? IBS (irritable bowel syndrome)   ? Colonoscopy 09/04/2019 x 2 precanerous polyps removed, repeat 3-5 yrs  ? Interstitial cystitis 2019  ? Migraines   ? ?  ?Social History  ? ?Tobacco Use  ? Smoking status: Never  ? Smokeless tobacco: Never  ?Vaping Use  ? Vaping Use: Never used  ?Substance Use Topics  ? Alcohol use: Yes  ?  Comment: occassional  ? Drug use: No  ? ? ?Past Surgical History:  ?Procedure Laterality Date  ? CHOLECYSTECTOMY N/A 08/08/2016  ? Procedure: LAPAROSCOPIC CHOLECYSTECTOMY;  Surgeon: Coralie Keens, MD;  Location: [facility];  Service: General;  Laterality: N/A;  ? WISDOM TOOTH EXTRACTION    ? ? ?Family History  ?Problem Relation Age of Onset  ? Mental illness Mother   ? Drug abuse Mother   ? Healthy Father   ? Healthy Sister   ? Healthy Brother   ? Heart failure Other   ? Hypertension Other   ? Depression Paternal Aunt   ? Anxiety disorder Paternal Aunt   ? Stroke Paternal Grandfather   ? Liver disease Maternal Grandmother   ? Colon polyps Neg Hx   ? Esophageal cancer Neg Hx   ? Stomach cancer Neg Hx   ? Rectal cancer Neg Hx   ? Allergic rhinitis Neg Hx   ? Angioedema Neg Hx   ? Asthma Neg Hx   ? Atopy Neg Hx   ? Eczema Neg Hx   ? Immunodeficiency Neg Hx   ? Urticaria Neg Hx   ? ? ?Allergies  ?Allergen Reactions  ? Citrus Itching and Other (See Comments)  ?  SWEATING  ? Sulfa Antibiotics Nausea Only, Other (See Comments) and Nausea And Vomiting  ?  FEVER  ? ? ?Current Medications:  ? ?Current  Outpatient Medications:  ?  EPINEPHrine 0.3 mg/0.3 mL IJ SOAJ injection, Inject 0.3 mLs (0.3 mg total) into the muscle as needed for anaphylaxis., Disp: 2 each, Rfl: 1 ?  naproxen (NAPROSYN) 500 MG tablet, Take 1 tablet (500 mg total) by mouth daily as needed. Take one time with Sumatriptan at onset of headache., Disp: 10 tablet, Rfl: 0 ?  SUMAtriptan (IMITREX) 25 MG tablet, Take 1-2 tablets (25-50 mg total) by mouth every 2 (two) hours as needed for migraine. Max dose of 4 tabs in 24 hours., Disp: 10 tablet, Rfl: 1  ? ?Review of Systems:  ? ?ROS ? ?Vitals:  ? ?There were no vitals filed for this visit.   ?There is no height or weight on file to calculate BMI. ? ?Physical Exam:  ? ?Physical Exam ? ?Assessment and Plan:  ? ?'@DIAGLIST'$ @ ? ? ? ? ?*** ? ?Inda Coke, PA-C ? ?

## 2022-05-20 ENCOUNTER — Ambulatory Visit: Payer: Self-pay | Admitting: Physician Assistant

## 2022-06-01 ENCOUNTER — Encounter: Payer: Self-pay | Admitting: Physician Assistant

## 2022-06-01 ENCOUNTER — Ambulatory Visit (INDEPENDENT_AMBULATORY_CARE_PROVIDER_SITE_OTHER): Payer: Self-pay | Admitting: Physician Assistant

## 2022-06-01 VITALS — BP 98/60 | HR 56 | Temp 98.1°F | Ht 69.0 in | Wt 152.4 lb

## 2022-06-01 DIAGNOSIS — E010 Iodine-deficiency related diffuse (endemic) goiter: Secondary | ICD-10-CM

## 2022-06-01 DIAGNOSIS — R21 Rash and other nonspecific skin eruption: Secondary | ICD-10-CM

## 2022-06-01 NOTE — Progress Notes (Signed)
Victoria Carson is a 26 y.o. female here for a new problem of goiter.   History of Present Illness:   Chief Complaint  Patient presents with   goiter swelling    Pt noticed goiter in her neck has increased in swelling since mid May.     HPI  Goiter swelling Patient presents with complain of goiter swelling in her neck. She has had noticed increased swelling for the past 2 months. She was previously on Levothyroxine 25 mcg daily but we stopped this when she had decided to stop taking this medication and then follow-up labs were normal. Has had increased feeling of fatigue. States she has had noticed "rock in her throat" when this started. This has resolved. Denies hair loss, heart palpitations, insomnia. Denies: lightheadedness, n/v, changes to urination/BM. She states that she is gaining weight, at least 10 lb recently.  Had thyroid u/s in 2020 - showed thyromegaly without nodules.  Rash Patient has noticed some rash/spots around her abdomen area. Has had some itching. States this has been changing in color and size. Has not had anything like this before. Denies fever or chills or known exposures to environmental changes. Has not tried anything for her symptoms.   Past Medical History:  Diagnosis Date   Chronic cholecystitis 07/2016   Cyst of right Bartholin's gland 05/2020   Dental crown present    Difficulty swallowing pills    Dust allergy    Eczema    GERD (gastroesophageal reflux disease)    no current med.   Hypothyroidism (acquired)    IBS (irritable bowel syndrome)    Colonoscopy 09/04/2019 x 2 precanerous polyps removed, repeat 3-5 yrs   Interstitial cystitis 2019   Migraines      Social History   Tobacco Use   Smoking status: Never   Smokeless tobacco: Never  Vaping Use   Vaping Use: Never used  Substance Use Topics   Alcohol use: Yes    Comment: occassional   Drug use: No    Past Surgical History:  Procedure Laterality Date   CHOLECYSTECTOMY N/A  08/08/2016   Procedure: LAPAROSCOPIC CHOLECYSTECTOMY;  Surgeon: Coralie Keens, MD;  Location: [place];  Service: General;  Laterality: N/A;   WISDOM TOOTH EXTRACTION      Family History  Problem Relation Age of Onset   Mental illness Mother    Drug abuse Mother    Healthy Father    Healthy Sister    Healthy Brother    Heart failure Other    Hypertension Other    Depression Paternal Aunt    Anxiety disorder Paternal Aunt    Stroke Paternal Grandfather    Liver disease Maternal Grandmother    Colon polyps Neg Hx    Esophageal cancer Neg Hx    Stomach cancer Neg Hx    Rectal cancer Neg Hx    Allergic rhinitis Neg Hx    Angioedema Neg Hx    Asthma Neg Hx    Atopy Neg Hx    Eczema Neg Hx    Immunodeficiency Neg Hx    Urticaria Neg Hx     Allergies  Allergen Reactions   Citrus Itching and Other (See Comments)    SWEATING   Sulfa Antibiotics Nausea Only, Other (See Comments) and Nausea And Vomiting    FEVER    Current Medications:   Current Outpatient Medications:    EPINEPHrine 0.3 mg/0.3 mL IJ SOAJ injection, Inject 0.3 mLs (0.3 mg total) into the muscle  as needed for anaphylaxis., Disp: 2 each, Rfl: 1   naproxen (NAPROSYN) 500 MG tablet, Take 1 tablet (500 mg total) by mouth daily as needed. Take one time with Sumatriptan at onset of headache., Disp: 10 tablet, Rfl: 0   SUMAtriptan (IMITREX) 25 MG tablet, Take 1-2 tablets (25-50 mg total) by mouth every 2 (two) hours as needed for migraine. Max dose of 4 tabs in 24 hours., Disp: 10 tablet, Rfl: 1   Review of Systems:   ROS Negative unless otherwise specified per HPI.   Vitals:   Vitals:   06/01/22 1457  BP: 98/60  Pulse: (!) 56  Temp: 98.1 F (36.7 C)  TempSrc: Temporal  SpO2: 95%  Weight: 152 lb 6.1 oz (69.1 kg)  Height: '5\' 9"'$  (1.753 m)     Body mass index is 22.5 kg/m.  Physical Exam:   Physical Exam Vitals and nursing note reviewed.  Constitutional:      General: She is not in  acute distress.    Appearance: She is well-developed. She is not ill-appearing or toxic-appearing.  Neck:     Thyroid: Thyromegaly present.  Cardiovascular:     Rate and Rhythm: Normal rate and regular rhythm.     Pulses: Normal pulses.     Heart sounds: Normal heart sounds, S1 normal and S2 normal.  Pulmonary:     Effort: Pulmonary effort is normal.     Breath sounds: Normal breath sounds.  Musculoskeletal:     Cervical back: Full passive range of motion without pain.  Skin:    General: Skin is warm and dry.     Comments: Approximately 1 cm erythematous patch with raised border to middle of abdomen  Neurological:     Mental Status: She is alert.     GCS: GCS eye subscore is 4. GCS verbal subscore is 5. GCS motor subscore is 6.  Psychiatric:        Speech: Speech normal.        Behavior: Behavior normal. Behavior is cooperative.     Assessment and Plan:   Thyromegaly Obvious swelling of thyroid and now having some symptoms Will update thyroid ultrasound and blood work Low threshold to send to endo if abnormalities  Rash and nonspecific skin eruption Possible fungal etiology Trial OTC clotrimazole If no improvement, reach out  [employer] as a Education administrator for [employer], PA.,have documented all relevant documentation on the behalf of Inda Coke, PA,as directed by  Inda Coke, PA while in the presence of Inda Coke, [place].   I, Inda Coke, [place], have reviewed all documentation for this visit. The documentation on 06/02/22 for the exam, diagnosis, procedures, and orders are all accurate and complete.  Inda Coke, PA-C

## 2022-06-01 NOTE — Patient Instructions (Signed)
It was great to see you!  Update blood work today I am putting in an order for an ultrasound  If you don't hear anything in about a week, please reach out to [facility] --> Call [phone] or [phone]  Trial OTC clotrimazole (look for a fungal or anti-itch ointment) to trial on your rash - if no improvement, let me know  If a referral was placed today --> you will be contacted for an appointment. Please note that routine referrals can sometimes take up to 3-4 weeks to process. Please call our office if you haven't heard anything after this time frame.  If blood work, urine studies, or any imaging was ordered today -->  we will release your results to you on your MyChart account (if you have chosen to sign up for this) with further instructions. You may see the results before I do, but when I review them I will send you a message with my report or have my staff call you if things need to be discussed. Please reply to my message with any questions.   Take care,  Inda Coke PA-C

## 2022-06-02 LAB — COMPREHENSIVE METABOLIC PANEL
ALT: 9 U/L (ref 0–35)
AST: 12 U/L (ref 0–37)
Albumin: 4.3 g/dL (ref 3.5–5.2)
Alkaline Phosphatase: 64 U/L (ref 39–117)
BUN: 6 mg/dL (ref 6–23)
CO2: 29 mEq/L (ref 19–32)
Calcium: 8.9 mg/dL (ref 8.4–10.5)
Chloride: 108 mEq/L (ref 96–112)
Creatinine, Ser: 0.67 mg/dL (ref 0.40–1.20)
GFR: 121.75 mL/min (ref >60.00)
Glucose, Bld: 89 mg/dL (ref 70–99)
Potassium: 3.8 mEq/L (ref 3.5–5.1)
Sodium: 141 mEq/L (ref 135–145)
Total Bilirubin: 0.5 mg/dL (ref 0.2–1.2)
Total Protein: 6.9 g/dL (ref 6.0–8.3)

## 2022-06-02 LAB — CBC WITH DIFFERENTIAL/PLATELET
Basophils Absolute: 0 10*3/uL (ref 0.0–0.1)
Basophils Relative: 0.5 % (ref 0.0–3.0)
Eosinophils Absolute: 0.1 10*3/uL (ref 0.0–0.7)
Eosinophils Relative: 3.1 % (ref 0.0–5.0)
HCT: 39.5 % (ref 36.0–46.0)
Hemoglobin: 13.3 g/dL (ref 12.0–15.0)
Lymphocytes Relative: 31.9 % (ref 12.0–46.0)
Lymphs Abs: 1.4 10*3/uL (ref 0.7–4.0)
MCHC: 33.7 g/dL (ref 30.0–36.0)
MCV: 94.8 fl (ref 78.0–100.0)
Monocytes Absolute: 0.5 10*3/uL (ref 0.1–1.0)
Monocytes Relative: 10.8 % (ref 3.0–12.0)
Neutro Abs: 2.4 10*3/uL (ref 1.4–7.7)
Neutrophils Relative %: 53.7 % (ref 43.0–77.0)
Platelets: 285 10*3/uL (ref 150.0–400.0)
RBC: 4.16 Mil/uL (ref 3.87–5.11)
RDW: 12.9 % (ref 11.5–15.5)
WBC: 4.5 10*3/uL (ref 4.0–10.5)

## 2022-06-02 LAB — T4, FREE: Free T4: 0.77 ng/dL (ref 0.60–1.60)

## 2022-06-02 LAB — T3, FREE: T3, Free: 2.6 pg/mL (ref 2.3–4.2)

## 2022-06-02 LAB — TSH: TSH: 4.09 u[IU]/mL (ref 0.35–5.50)

## 2022-06-03 ENCOUNTER — Encounter: Payer: Self-pay | Admitting: Physician Assistant

## 2022-06-16 ENCOUNTER — Ambulatory Visit
Admission: RE | Admit: 2022-06-16 | Discharge: 2022-06-16 | Disposition: A | Discharge status: Home / Self Care | Payer: No Typology Code available for payment source | Source: Ambulatory Visit | Attending: Physician Assistant | Admitting: Physician Assistant

## 2022-06-16 DIAGNOSIS — E010 Iodine-deficiency related diffuse (endemic) goiter: Secondary | ICD-10-CM

## 2022-07-11 ENCOUNTER — Encounter: Payer: Self-pay | Admitting: Physician Assistant

## 2022-07-11 DIAGNOSIS — G4489 Other headache syndrome: Secondary | ICD-10-CM

## 2022-07-11 MED ORDER — SUMATRIPTAN SUCCINATE 25 MG PO TABS: 25.0000 mg | ORAL_TABLET | ORAL | 1 refills | Status: DC | PRN

## 2022-07-11 MED ORDER — NAPROXEN 500 MG PO TABS: 500.0000 mg | ORAL_TABLET | Freq: Every day | ORAL | 0 refills | Status: DC | PRN

## 2022-08-26 ENCOUNTER — Ambulatory Visit (INDEPENDENT_AMBULATORY_CARE_PROVIDER_SITE_OTHER): Payer: Managed Care, Other (non HMO) | Admitting: Physician Assistant

## 2022-08-26 ENCOUNTER — Encounter: Payer: Self-pay | Admitting: Physician Assistant

## 2022-08-26 VITALS — BP 100/70 | HR 73 | Temp 98.4°F | Ht 69.0 in | Wt 163.4 lb

## 2022-08-26 DIAGNOSIS — G43109 Migraine with aura, not intractable, without status migrainosus: Secondary | ICD-10-CM

## 2022-08-26 MED ORDER — RIZATRIPTAN BENZOATE 5 MG PO TABS: 5.0000 mg | ORAL_TABLET | ORAL | 0 refills | Status: DC | PRN

## 2022-08-26 NOTE — Patient Instructions (Addendum)
We are going to switch out your emergency medicine, instead of Imitrex, let's trial Maxalt Message me if this is not effective or if you have new migraine symptoms  I'm going to go ahead and refer you to neurology as a back up.  Migraine Recommendations: 1.  At this point we will not do daily preventative medication 2.  Take Maxalt 5 mg at earliest onset of headache.  May repeat dose once in 2 hours if needed.  Do not exceed two tablets in 24 hours. 3.  Limit use of pain relievers to no more than 2 days out of the week.  These medications include acetaminophen, ibuprofen, triptans and narcotics.  This will help reduce risk of rebound headaches. 4.  Be aware of common food triggers such as processed sweets, processed foods with nitrites (such as deli meat, hot dogs, sausages), foods with MSG, alcohol (such as wine), chocolate, certain cheeses, certain fruits (dried fruits, bananas, pineapple), vinegar, diet soda. 4.  Avoid caffeine 5.  Routine exercise 6.  Proper sleep hygiene 7.  Stay adequately hydrated with water 8.  Keep a headache diary. 9.  Maintain proper stress management. 10.  Do not skip meals. 11.  Consider supplements:  Magnesium citrate '400mg'$  to '600mg'$  daily, riboflavin '400mg'$ , Coenzyme Q 10 '100mg'$  three times daily  Headache Precautions: Contact a doctor if: Your symptoms are not helped by medicine. You have a headache that feels different than the other headaches. You feel sick to your stomach (nauseous) or you throw up (vomit). You have a fever. Get help right away if: Your headache gets very bad quickly. Your headache gets worse after a lot of physical activity. You keep throwing up. You have a stiff neck. You have trouble seeing. You have trouble speaking. You have pain in the eye or ear. Your muscles are weak or you lose muscle control. You lose your balance or have trouble walking. You feel like you will pass out (faint) or you pass out. You are mixed up  (confused). You have a seizure.

## 2022-08-26 NOTE — Progress Notes (Signed)
Victoria Carson is a 26 y.o. female here for a follow up of a pre-existing problem.  History of Present Illness:   Chief Complaint  Patient presents with   Migraine    Pt c/o increase in Migraines for the past month. She is having aura's prior to headache where she loses eye sight in left eye last about 30 minutes each time. Last headache was yesterday morning and had an aura lost site in left eye and also having numbness left side of face, lasted about 15-20 minutes.    HPI  Migraines Pt c/o increase in migraines for the past month. She is having zigzag flashing auras prior to headache, starts at the periphery and then takes over her whole left eye. Lasts about 30 minutes each time. Yesterday she also experienced some tingling/numbness on the left side of her face lasted about 15-20 minutes.  She was 13 when she saw Neurology and first had migraines. Had only 2 migraines and then not any until 19. She had another bout of migraines and then none again. Over the past few months she has had another flare.  Triggers for migraines -- are light and stress.   The last time she took Imitrex it didn't work.  Prior to Imitrex, has taken Excedrin.  Denies: worst HA of life, weakness in extremities  She is seeing her eye doctor next week.  Past Medical History:  Diagnosis Date   Chronic cholecystitis 07/2016   Cyst of right Bartholin's gland 05/2020   Dental crown present    Difficulty swallowing pills    Dust allergy    Eczema    GERD (gastroesophageal reflux disease)    no current med.   Hypothyroidism (acquired)    IBS (irritable bowel syndrome)    Colonoscopy 09/04/2019 x 2 precanerous polyps removed, repeat 3-5 yrs   Interstitial cystitis 2019   Migraines      Social History   Tobacco Use   Smoking status: Never   Smokeless tobacco: Never  Vaping Use   Vaping Use: Never used  Substance Use Topics   Alcohol use: Yes    Comment: occassional   Drug use: No    Past  Surgical History:  Procedure Laterality Date   CHOLECYSTECTOMY N/A 08/08/2016   Procedure: LAPAROSCOPIC CHOLECYSTECTOMY;  Surgeon: Coralie Keens, MD;  Location: [place];  Service: General;  Laterality: N/A;   WISDOM TOOTH EXTRACTION      Family History  Problem Relation Age of Onset   Mental illness Mother    Drug abuse Mother    Healthy Father    Healthy Sister    Healthy Brother    Heart failure Other    Hypertension Other    Depression Paternal Aunt    Anxiety disorder Paternal Aunt    Stroke Paternal Grandfather    Liver disease Maternal Grandmother    Colon polyps Neg Hx    Esophageal cancer Neg Hx    Stomach cancer Neg Hx    Rectal cancer Neg Hx    Allergic rhinitis Neg Hx    Angioedema Neg Hx    Asthma Neg Hx    Atopy Neg Hx    Eczema Neg Hx    Immunodeficiency Neg Hx    Urticaria Neg Hx     Allergies  Allergen Reactions   Citrus Itching and Other (See Comments)    SWEATING   Sulfa Antibiotics Nausea Only, Other (See Comments) and Nausea And Vomiting    FEVER  Current Medications:   Current Outpatient Medications:    EPINEPHrine 0.3 mg/0.3 mL IJ SOAJ injection, Inject 0.3 mLs (0.3 mg total) into the muscle as needed for anaphylaxis., Disp: 2 each, Rfl: 1   naproxen (NAPROSYN) 500 MG tablet, Take 1 tablet (500 mg total) by mouth daily as needed. Take one time with Sumatriptan at onset of headache., Disp: 10 tablet, Rfl: 0   SUMAtriptan (IMITREX) 25 MG tablet, Take 1-2 tablets (25-50 mg total) by mouth every 2 (two) hours as needed for migraine. Max dose of 4 tabs in 24 hours., Disp: 10 tablet, Rfl: 1   Review of Systems:   ROS Negative unless otherwise specified per HPI.  Vitals:   Vitals:   08/26/22 0946  BP: 100/70  Pulse: 73  Temp: 98.4 F (36.9 C)  TempSrc: Temporal  SpO2: 100%  Weight: 163 lb 6.1 oz (74.1 kg)  Height: '5\' 9"'$  (2.956 m)     Body mass index is 24.13 kg/m.  Physical Exam:   Physical Exam Vitals and  nursing note reviewed.  Constitutional:      General: She is not in acute distress.    Appearance: She is well-developed. She is not ill-appearing or toxic-appearing.  Cardiovascular:     Rate and Rhythm: Normal rate and regular rhythm.     Pulses: Normal pulses.     Heart sounds: Normal heart sounds, S1 normal and S2 normal.  Pulmonary:     Effort: Pulmonary effort is normal.     Breath sounds: Normal breath sounds.  Skin:    General: Skin is warm and dry.  Neurological:     General: No focal deficit present.     Mental Status: She is alert.     GCS: GCS eye subscore is 4. GCS verbal subscore is 5. GCS motor subscore is 6.     Cranial Nerves: Cranial nerves 2-12 are intact.     Sensory: Sensation is intact.     Motor: Motor function is intact.     Coordination: Coordination is intact.     Gait: Gait is intact.  Psychiatric:        Speech: Speech normal.        Behavior: Behavior normal. Behavior is cooperative.     Assessment and Plan:   Migraine with aura and without status migrainosus, not intractable Uncontrolled No red flags Neuro exam is WNL Will trial maxalt 5 mg as abortive Consider Magnesium citrate '400mg'$  to '600mg'$  daily, riboflavin '400mg'$ , Coenzyme Q 10 '100mg'$  three times daily Neuro referral  If new/worsening sx, ask that she reach out   Inda Coke, [place]

## 2022-08-30 ENCOUNTER — Encounter: Payer: Self-pay | Admitting: Neurology

## 2022-09-01 ENCOUNTER — Encounter: Payer: Self-pay | Admitting: Gastroenterology

## 2022-11-28 ENCOUNTER — Ambulatory Visit (INDEPENDENT_AMBULATORY_CARE_PROVIDER_SITE_OTHER): Payer: Managed Care, Other (non HMO) | Admitting: Physician Assistant

## 2022-11-28 ENCOUNTER — Encounter: Payer: Self-pay | Admitting: Physician Assistant

## 2022-11-28 VITALS — BP 102/70 | HR 65 | Temp 98.0°F | Ht 69.0 in | Wt 173.2 lb

## 2022-11-28 DIAGNOSIS — G43109 Migraine with aura, not intractable, without status migrainosus: Secondary | ICD-10-CM | POA: Diagnosis not present

## 2022-11-28 DIAGNOSIS — M25531 Pain in right wrist: Secondary | ICD-10-CM

## 2022-11-28 MED ORDER — MELOXICAM 7.5 MG PO TABS: 7.5000 mg | ORAL_TABLET | Freq: Every day | ORAL | 0 refills | Status: DC

## 2022-11-28 NOTE — Patient Instructions (Addendum)
It was great to see you! For your wrist pain --  start meloxicam 7.5 mg daily x 1-2 weeks Continue brace Consider icing Refer to hand out to see if exercises help If no relief, message me and we will refer to sports medicine  For your next migraine, trial the Nurtec and let me know if this works for you!  Take care,  Inda Coke PA-C

## 2022-11-28 NOTE — Progress Notes (Signed)
Victoria Carson is a 26 y.o. female here for a new problem.  History of Present Illness:   Chief Complaint  Patient presents with   Wrist Pain    Pt c/o right wrist pain x 6 months, worse past month. It is the wrist she uses her mouse with for work.    HPI  Right Wrist Pain: Patient complains of right wrist pain, which started in June 2023, but has gotten worse in the last month. She notes that her right wrist pain has been waking her up at night. 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 Her wrist pain worsens with hand movement. She complains of occasional right hand numbness. Patient denies taking any medication for her pain. Denies trauma.   Migraine: She notes she had migraine that lasted for 3 days since our last visit. She took Maxalt 5 mg, but it did not help resolve her migraine. Patient notes her migraine started after her work and thinks it started due to staring at computer screen for a long period of time.     Past Medical History:  Diagnosis Date   Chronic cholecystitis 07/2016   Cyst of right Bartholin's gland 05/2020   Dental crown present    Difficulty swallowing pills    Dust allergy    Eczema    GERD (gastroesophageal reflux disease)    no current med.   Hypothyroidism (acquired)    IBS (irritable bowel syndrome)    Colonoscopy 09/04/2019 x 2 precanerous polyps removed, repeat 3-5 yrs   Interstitial cystitis 2019   Migraines      Social History   Tobacco Use   Smoking status: Never   Smokeless tobacco: Never  Vaping Use   Vaping Use: Never used  Substance Use Topics   Alcohol use: Yes    Comment: occassional   Drug use: No    Past Surgical History:  Procedure Laterality Date   CHOLECYSTECTOMY N/A 08/08/2016   Procedure: LAPAROSCOPIC CHOLECYSTECTOMY;  Surgeon: Coralie Keens, MD;  Location: [place];  Service: General;  Laterality: N/A;   WISDOM TOOTH EXTRACTION      Family History  Problem Relation  Age of Onset   Mental illness Mother    Drug abuse Mother    Healthy Father    Healthy Sister    Healthy Brother    Heart failure Other    Hypertension Other    Depression Paternal Aunt    Anxiety disorder Paternal Aunt    Stroke Paternal Grandfather    Liver disease Maternal Grandmother    Colon polyps Neg Hx    Esophageal cancer Neg Hx    Stomach cancer Neg Hx    Rectal cancer Neg Hx    Allergic rhinitis Neg Hx    Angioedema Neg Hx    Asthma Neg Hx    Atopy Neg Hx    Eczema Neg Hx    Immunodeficiency Neg Hx    Urticaria Neg Hx     Allergies  Allergen Reactions   Citrus Itching and Other (See Comments)    SWEATING   Sulfa Antibiotics Nausea Only, Other (See Comments) and Nausea And Vomiting    FEVER    Current Medications:   Current Outpatient Medications:    EPINEPHrine 0.3 mg/0.3 mL IJ SOAJ injection, Inject 0.3 mLs (0.3 mg total) into the muscle as needed for anaphylaxis., Disp: 2 each, Rfl: 1   meloxicam (MOBIC) 7.5 MG tablet, Take 1 tablet (7.5 mg  total) by mouth daily., Disp: 30 tablet, Rfl: 0   naproxen (NAPROSYN) 500 MG tablet, Take 1 tablet (500 mg total) by mouth daily as needed. Take one time with Sumatriptan at onset of headache., Disp: 10 tablet, Rfl: 0   rizatriptan (MAXALT) 5 MG tablet, Take 1 tablet (5 mg total) by mouth as needed for migraine. May repeat in 2 hours if needed, Disp: 10 tablet, Rfl: 0   Review of Systems:   Review of Systems  Constitutional:  Negative for chills, fever, malaise/fatigue and weight loss.  HENT:  Negative for hearing loss, sinus pain and sore throat.   Respiratory:  Negative for cough and hemoptysis.   Cardiovascular:  Negative for chest pain, palpitations, orthopnea, leg swelling and PND.  Gastrointestinal:  Negative for abdominal pain, constipation, diarrhea, heartburn, nausea and vomiting.  Genitourinary:  Negative for dysuria, frequency and urgency.  Musculoskeletal:  Negative for back pain, myalgias and neck pain.        (+) Right wrist pain  Skin:  Negative for itching and rash.  Endo/Heme/Allergies:  Negative for polydipsia.  Psychiatric/Behavioral:  Negative for depression. The patient is not nervous/anxious.     Vitals:   Vitals:   11/28/22 0939  BP: 102/70  Pulse: 65  Temp: 98 F (36.7 C)  TempSrc: Temporal  SpO2: 96%  Weight: 173 lb 4 oz (78.6 kg)  Height: '5\' 9"'$  (1.753 m)     Body mass index is 25.58 kg/m.  Physical Exam:   Physical Exam Constitutional:      General: She is not in acute distress.    Appearance: Normal appearance. She is not ill-appearing.  HENT:     Head: Normocephalic and atraumatic.     Right Ear: External ear normal.     Left Ear: External ear normal.  Eyes:     Extraocular Movements: Extraocular movements intact.     Pupils: Pupils are equal, round, and reactive to light.  Cardiovascular:     Rate and Rhythm: Normal rate and regular rhythm.     Pulses: Normal pulses.     Heart sounds: Normal heart sounds. No murmur heard.    No gallop.  Pulmonary:     Effort: Pulmonary effort is normal. No respiratory distress.     Breath sounds: Normal breath sounds. No wheezing or rales.  Musculoskeletal:     Comments: Slight pain elicited with flexion and extension of R wrist No obvious swelling TTP of general aspect of wrist Finkelsteins positive Tinels sign positive  Skin:    General: Skin is warm and dry.  Neurological:     Mental Status: She is alert and oriented to person, place, and time.  Psychiatric:        Judgment: Judgment normal.     Assessment and Plan:   Migraine with aura and without status migrainosus, not intractable No red flags Sample of Nurtec 75 mg ODT provided - discussed taking 1 tablet in 24 hours and letting us know if this works for her Follow-up if new/worsening  Right wrist pain New Suspect possible carpal tunnel and/or dequervains tenosynovitis Start daily mobic 7.5 mg x 1-2 weeks Use brace, consider ice Exercises  provided on hand-out If lack of improvement -- will place referral to sports medicine  I,Param Shah,acting as a scribe for [employer], PA.,have documented all relevant documentation on the behalf of Inda Coke, PA,as directed by  Inda Coke, PA while in the presence of Inda Coke, [place].  I, Inda Coke, [place], have reviewed  all documentation for this visit. The documentation on 11/28/22 for the exam, diagnosis, procedures, and orders are all accurate and complete.  Inda Coke, PA-C

## 2022-11-29 ENCOUNTER — Ambulatory Visit: Payer: Managed Care, Other (non HMO) | Admitting: Physician Assistant

## 2022-12-13 ENCOUNTER — Encounter: Payer: Self-pay | Admitting: Physician Assistant

## 2022-12-22 ENCOUNTER — Other Ambulatory Visit: Payer: Self-pay | Admitting: Physician Assistant

## 2022-12-22 DIAGNOSIS — G43109 Migraine with aura, not intractable, without status migrainosus: Secondary | ICD-10-CM

## 2023-01-12 NOTE — Progress Notes (Signed)
[name] MRN: [id number] DOB: [birth year]  Referring provider: Inda Coke, PA Primary care provider: Inda Coke, PA  Reason for consult:  migraines  Assessment/Plan:   Migraine with aura, without status migrainosus, not intractable  Migraine prevention:  Plan to start Ajovy every 28 days Migraine rescue:  Nurtec, Zofran ODT for nausea Limit use of pain relievers to no more than 2 days out of week to prevent risk of rebound or medication-overuse headache. Keep headache diary Requested that she send me a MyChart message after she has the brain MRI, so that I may review it. Follow up 4 to 5 months.    Subjective:  Victoria Carson is a 27 year old female who presents for migraines.  History supplemented by referring provider's note.  Onset:  27 years old.  Initially infrequent. (One every 6 months)  Started increasing around 27 years old.   Location:  behind right eye Quality:  squeezing, throbbing Intensity:  severe.   Aura:  Typically gray sheet over one eye (either eye).  In December 2023, it involved both eyes lasting 30-60 minutes, sometimes unilateral numbness and tingling in one hand (side of the visual aura), one time involved the face.  Rarely, she will have the aura back-to-back, recurring 30 minutes later. Prodrome:  absent Postdrome:  poor concentration/focus and head soreness for a couple of days Associated symptoms:  Nausea, photophobia, phonophobia.  She denies associated unilateral weakness. Duration:  3 days (2 hours with Nurtec but with postdrome dull headache for another 2 days) Frequency:  once a month Frequency of abortive medication: none Triggers:  bright lights, glare Relieving factors:  sleeping Activity:  movement/activity aggravates  She has an MRI of the brain with and without contrast scheduled for tomorrow.  Past NSAIDS/analgesics:  ibuprofen, ketorolac IM, acetaminophen, meloxicam, naproxen Past  abortive triptans:  rizatriptan, sumatriptan tab Past abortive ergotamine:  none Past muscle relaxants:  cyclobenzaprine, tizanidine Past anti-emetic:  ondansetron Past antihypertensive medications:  propranolol, metoprolol Past antidepressant medications:  venlafaxine, escitalopram, mirtazapine Past anticonvulsant medications:  lamotrigine Past anti-CGRP:  none Past vitamins/Herbal/Supplements:  none Past antihistamines/decongestants:  diphenhydramine, meclizine Other past therapies:  none  Current NSAIDS/analgesics:  none Current triptans:  none Current ergotamine:  none Current anti-emetic:  none Current muscle relaxants:  none Current Antihypertensive medications:  none Current Antidepressant medications:  none Current Anticonvulsant medications:  none Current anti-CGRP:  Nurtec PRN (samples - severe for 1-2 hours but then a dull headache for the next couple of days) Current Vitamins/Herbal/Supplements:  none Current Antihistamines/Decongestants:  none Other therapy:  none Birth control:  none Other medications:  none   Caffeine:  1 to 2 cups coffee a week; 5-6 cans of Coke daily Alcohol:  once a week Smoker:  no Diet:  Does not drink much water.  5-6 cans of Coke daily.  1 to 2 fast food meals a week.  Does not skip meals Exercise:  no Depression:  no; Anxiety:  no Other pain:  no Sleep hygiene:  good Family history of headache:  not on her father's side of the family (does not know about her mother's side of the family)      PAST MEDICAL HISTORY: Past Medical History:  Diagnosis Date   Chronic cholecystitis 07/2016   Cyst of right Bartholin's gland 05/2020   Dental crown present    Difficulty swallowing pills    Dust allergy    Eczema    GERD (gastroesophageal reflux disease)  no current med.   Hypothyroidism (acquired)    IBS (irritable bowel syndrome)    Colonoscopy 09/04/2019 x 2 precanerous polyps removed, repeat 3-5 yrs   Interstitial cystitis 2019    Migraines     PAST SURGICAL HISTORY: Past Surgical History:  Procedure Laterality Date   CHOLECYSTECTOMY N/A 08/08/2016   Procedure: LAPAROSCOPIC CHOLECYSTECTOMY;  Surgeon: Coralie Keens, MD;  Location: [facility];  Service: General;  Laterality: N/A;   WISDOM TOOTH EXTRACTION      MEDICATIONS: Current Outpatient Medications on File Prior to Visit  Medication Sig Dispense Refill   EPINEPHrine 0.3 mg/0.3 mL IJ SOAJ injection Inject 0.3 mLs (0.3 mg total) into the muscle as needed for anaphylaxis. 2 each 1   meloxicam (MOBIC) 7.5 MG tablet Take 1 tablet (7.5 mg total) by mouth daily. 30 tablet 0   naproxen (NAPROSYN) 500 MG tablet Take 1 tablet (500 mg total) by mouth daily as needed. Take one time with Sumatriptan at onset of headache. 10 tablet 0   rizatriptan (MAXALT) 5 MG tablet Take 1 tablet (5 mg total) by mouth as needed for migraine. May repeat in 2 hours if needed 10 tablet 0   No current facility-administered medications on file prior to visit.    ALLERGIES: Allergies  Allergen Reactions   Citrus Itching and Other (See Comments)    SWEATING   Sulfa Antibiotics Nausea Only, Other (See Comments) and Nausea And Vomiting    FEVER    FAMILY HISTORY: Family History  Problem Relation Age of Onset   Mental illness Mother    Drug abuse Mother    Healthy Father    Healthy Sister    Healthy Brother    Heart failure Other    Hypertension Other    Depression Paternal Aunt    Anxiety disorder Paternal Aunt    Stroke Paternal Grandfather    Liver disease Maternal Grandmother    Colon polyps Neg Hx    Esophageal cancer Neg Hx    Stomach cancer Neg Hx    Rectal cancer Neg Hx    Allergic rhinitis Neg Hx    Angioedema Neg Hx    Asthma Neg Hx    Atopy Neg Hx    Eczema Neg Hx    Immunodeficiency Neg Hx    Urticaria Neg Hx     Objective:  Blood pressure 110/64, pulse 89, resp. rate 20, height '5\' 9"'$  (1.753 m), weight 173 lb (78.5 kg), SpO2 97  %. General: No acute distress.  Patient appears well-groomed.   Head:  Normocephalic/atraumatic Eyes:  fundi examined but not visualized Neck: supple, no paraspinal tenderness, full range of motion Back: No paraspinal tenderness Heart: regular rate and rhythm Lungs: Clear to auscultation bilaterally. Vascular: No carotid bruits. Neurological Exam: Mental status: alert and oriented to person, place, and time, speech fluent and not dysarthric, language intact. Cranial nerves: CN I: not tested CN II: pupils equal, round and reactive to light, visual fields intact CN III, IV, VI:  full range of motion, no nystagmus, no ptosis CN V: facial sensation intact. CN VII: upper and lower face symmetric CN VIII: hearing intact CN IX, X: gag intact, uvula midline CN XI: sternocleidomastoid and trapezius muscles intact CN XII: tongue midline Bulk & Tone: normal, no fasciculations. Motor:  muscle strength 5/5 throughout Sensation:  Pinprick, temperature and vibratory sensation intact. Deep Tendon Reflexes:  2+ throughout,  toes downgoing.   Finger to nose testing:  Without dysmetria.   Heel to  shin:  Without dysmetria.   Gait:  Normal station and stride.  Romberg negative.    Thank you for allowing me to take part in the care of this patient.  Metta Clines, DO  CC: Inda Coke, PA

## 2023-01-13 ENCOUNTER — Ambulatory Visit (INDEPENDENT_AMBULATORY_CARE_PROVIDER_SITE_OTHER): Payer: Managed Care, Other (non HMO) | Admitting: Neurology

## 2023-01-13 ENCOUNTER — Encounter: Payer: Self-pay | Admitting: Neurology

## 2023-01-13 VITALS — BP 110/64 | HR 89 | Resp 20 | Ht 69.0 in | Wt 173.0 lb

## 2023-01-13 DIAGNOSIS — G43109 Migraine with aura, not intractable, without status migrainosus: Secondary | ICD-10-CM

## 2023-01-13 MED ORDER — AJOVY 225 MG/1.5ML ~~LOC~~ SOAJ: 225.0000 mg | SUBCUTANEOUS | 5 refills | Status: DC

## 2023-01-13 MED ORDER — NURTEC 75 MG PO TBDP: 75.0000 mg | ORAL_TABLET | Freq: Every day | ORAL | 11 refills | Status: DC | PRN

## 2023-01-13 MED ORDER — ONDANSETRON 4 MG PO TBDP: 4.0000 mg | ORAL_TABLET | Freq: Three times a day (TID) | ORAL | 5 refills | Status: DC | PRN

## 2023-01-13 NOTE — Patient Instructions (Signed)
  Start Ajovy injection every 28 days.   Take Nurtec at earliest onset of headache.  Maximum 1 tablet in 24 hours. Use ondansetron for nausea Limit use of pain relievers to no more than 2 days out of the week.  These medications include acetaminophen, NSAIDs (ibuprofen/Advil/Motrin, naproxen/Aleve, triptans (Imitrex/sumatriptan), Excedrin, and narcotics.  This will help reduce risk of rebound headaches. Routine exercise Stay adequately hydrated (aim for 64 oz water daily) Keep headache diary - consider downloading app My Migraine Buddy Maintain proper stress management Maintain proper sleep hygiene Do not skip meals Consider supplements:  magnesium citrate '400mg'$  daily, riboflavin '400mg'$  daily, coenzyme Q10 '100mg'$  three times daily. Follow up 4-5 months.

## 2023-01-14 ENCOUNTER — Ambulatory Visit
Admission: RE | Admit: 2023-01-14 | Discharge: 2023-01-14 | Disposition: A | Discharge status: Home / Self Care | Payer: Managed Care, Other (non HMO) | Source: Ambulatory Visit | Attending: Physician Assistant | Admitting: Physician Assistant

## 2023-01-14 DIAGNOSIS — G43109 Migraine with aura, not intractable, without status migrainosus: Secondary | ICD-10-CM

## 2023-01-14 MED ORDER — GADOPICLENOL 0.5 MMOL/ML IV SOLN
8.0000 mL | Freq: Once | INTRAVENOUS | Status: AC | PRN
  Administered 2023-01-14: 8 mL via INTRAVENOUS

## 2023-02-28 ENCOUNTER — Encounter: Payer: Self-pay | Admitting: Physician Assistant

## 2023-02-28 NOTE — Telephone Encounter (Signed)
Victoria Carson, please see message and advise if appt needed. Also looks like pt only had one Varicella, one MMR and no Hepatitis A vaccines.

## 2023-04-10 ENCOUNTER — Ambulatory Visit: Payer: 59 | Admitting: Psychology

## 2023-04-10 ENCOUNTER — Ambulatory Visit: Payer: Managed Care, Other (non HMO) | Admitting: Physician Assistant

## 2023-04-11 ENCOUNTER — Ambulatory Visit (INDEPENDENT_AMBULATORY_CARE_PROVIDER_SITE_OTHER): Payer: 59 | Admitting: Psychology

## 2023-04-11 DIAGNOSIS — F411 Generalized anxiety disorder: Secondary | ICD-10-CM | POA: Diagnosis not present

## 2023-04-11 DIAGNOSIS — F4321 Adjustment disorder with depressed mood: Secondary | ICD-10-CM

## 2023-04-11 NOTE — Progress Notes (Signed)
[facility] Counselor Initial Adult Exam  Name: Victoria Carson Date: 04/11/2023 MRN: [id number] DOB: January 28, 1996 PCP: Jarold Motto, PA  Time Spent: 3:00  pm - 4:00 pm: 60 Minutes   Florie L Carson participated from home, via video, and consented to treatment. Therapist participated from home office. We met online due to COVID pandemic.   Guardian/Payee:  N/A    Paperwork requested: No   Reason for Visit /Presenting Problem: Grief associated with the loss of her grandfather  Mental Status Exam: Appearance:   Casual     Behavior:  Appropriate  Motor:  Normal  Speech/Language:   Normal Rate  Affect:  Appropriate  Mood:  depressed  Thought process:  normal  Thought content:    WNL  Sensory/Perceptual disturbances:    WNL  Orientation:  oriented to person, place, and situation  Attention:  Good  Concentration:  Good  Memory:  WNL  Fund of knowledge:   Good  Insight:    Good  Judgment:   Good  Impulse Control:  Good    Reported Symptoms:  sadness, tearful, helpless  Risk Assessment: Danger to Self:  No Self-injurious Behavior: No Danger to Others: No Duty to Warn:no Physical Aggression / Violence:No  Access to Firearms a concern: No  Gang Involvement:No  Patient / guardian was educated about steps to take if suicide or homicide risk level increases between visits: n/a While future psychiatric events cannot be accurately predicted, the patient does not currently require acute inpatient psychiatric care and does not currently meet [facility] involuntary commitment criteria.  Substance Abuse History: Current substance abuse: No     Past Psychiatric History:   Previous psychological history is significant for anxiety and depression Outpatient Providers:Santez Woodcox, Ph.D. History of Psych Hospitalization: No  Psychological Testing:  unknown    Abuse History:  Victim of: No.,  N/A    Report needed:  No. Victim of Neglect:No. Perpetrator of  N/A   Witness / Exposure to Domestic Violence: No   Protective Services Involvement: No  Witness to [employer] Violence:  No   Family History:  Family History  Problem Relation Age of Onset   Mental illness Mother    Drug abuse Mother    Healthy Father    Healthy Sister    Healthy Brother    Heart failure Other    Hypertension Other    Depression Paternal Aunt    Anxiety disorder Paternal Aunt    Stroke Paternal Grandfather    Liver disease Maternal Grandmother    Colon polyps Neg Hx    Esophageal cancer Neg Hx    Stomach cancer Neg Hx    Rectal cancer Neg Hx    Allergic rhinitis Neg Hx    Angioedema Neg Hx    Asthma Neg Hx    Atopy Neg Hx    Eczema Neg Hx    Immunodeficiency Neg Hx    Urticaria Neg Hx     Living situation: the patient lives with their spouse  Sexual Orientation: Straight  Relationship Status: married  Name of spouse / other:[name] If a parent, number of children / ages:N/A  Support Systems: spouse  Surveyor, quantity Stress:  No   Income/Employment/Disability: Employment  Financial planner: No   Educational History: Education: high school diploma/GED  Religion/Sprituality/World View: unknown  Any cultural differences that may affect / interfere with treatment:  N/A  Recreation/Hobbies: unknown  Stressors: Loss of grandfather    Strengths: Family  Barriers:  unknown   Legal History: Pending legal issue / charges: The patient has no significant history of legal issues. History of legal issue / charges:  N/A  Medical History/Surgical History: not reviewed Past Medical History:  Diagnosis Date   Chronic cholecystitis 07/2016   Cyst of right Bartholin's gland 05/2020   Dental crown present    Difficulty swallowing pills    Dust allergy    Eczema    GERD (gastroesophageal reflux disease)    no current med.   Hypothyroidism (acquired)    IBS (irritable bowel syndrome)    Colonoscopy 09/04/2019 x  2 precanerous polyps removed, repeat 3-5 yrs   Interstitial cystitis 2019   Migraines     Past Surgical History:  Procedure Laterality Date   CHOLECYSTECTOMY N/A 08/08/2016   Procedure: LAPAROSCOPIC CHOLECYSTECTOMY;  Surgeon: Hue Miyamoto, MD;  Location: [facility];  Service: General;  Laterality: N/A;   WISDOM TOOTH EXTRACTION      Medications: Current Outpatient Medications  Medication Sig Dispense Refill   EPINEPHrine 0.3 mg/0.3 mL IJ SOAJ injection Inject 0.3 mLs (0.3 mg total) into the muscle as needed for anaphylaxis. (Patient not taking: Reported on 01/13/2023) 2 each 1   Fremanezumab-vfrm (AJOVY) 225 MG/1.5ML SOAJ Inject 225 mg into the skin every 28 (twenty-eight) days. 1.68 mL 5   meloxicam (MOBIC) 7.5 MG tablet Take 1 tablet (7.5 mg total) by mouth daily. (Patient not taking: Reported on 01/13/2023) 30 tablet 0   naproxen (NAPROSYN) 500 MG tablet Take 1 tablet (500 mg total) by mouth daily as needed. Take one time with Sumatriptan at onset of headache. (Patient not taking: Reported on 01/13/2023) 10 tablet 0   ondansetron (ZOFRAN-ODT) 4 MG disintegrating tablet Take 1 tablet (4 mg total) by mouth every 8 (eight) hours as needed for nausea or vomiting. 20 tablet 5   Rimegepant Sulfate (NURTEC) 75 MG TBDP Take 1 tablet (75 mg total) by mouth daily as needed. 8 tablet 11   No current facility-administered medications for this visit.    Allergies  Allergen Reactions   Citrus Itching and Other (See Comments)    SWEATING   Sulfa Antibiotics Nausea Only, Other (See Comments) and Nausea And Vomiting    FEVER   Initial session: Patient states that her grandfather passed away Apr 17, 2023 second. She was seen by me in 2018 for outpatient therapy for depression. She was very close, as he had raised her since age 21. She had moved out in 2020, but saw him weekly. She says she is having a hard time accepting that he is gone. He is the first person close to her that she has lost.  Grandfather lived with her dad and going to visit isn't the same. She has not yet been to the grave, but plans to sometime soon. Her aunt is in possession of all of his material things and plans to pass them on to her. She4 has held on to his blanket and a few of his shirts. States she is feeling sad. She has been feeling like staying in bed all day and isn't even excited about upcoming trip to [place] (which she was previously very excited about). She says back in 2020, she had some depression when Covid 19 started. It "got better with time". Was not on medicine at the time.   She got married in 2021 and has changed jobs a few times. Now has a good job  she likes. She works from home, but has been able to keep up and feels supported from employer. 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 Husband's name is Thurston Pounds and Married since Dec. 2021. The marriage has been good. Thurston Pounds works for himself doing coding, websites, Catering manager. She says she and her sister now get along (did not in past), but has conflict with brother. He is 24. She states her father is doing okay, but she also sees he is stressed more than usual. She says that she has ongoing anxiety for years. One of her fears is that her father and Celine Ahr may die. Cannot remember the psychotropics she was on in the past. Infrequent alcohol. No recreational drugs. No hobbies. Has a number of friends who are supportive.  Suggested that she start to journal her memories of her grandfather.  Goals/Treatment plan: Patient is seeking strategies to manage the grief associated with the death of her grandfather. Wants to reduce her chronic sadness and improve ability to complete daily work and personal tasks. She is also seeking to reduce her symptoms of chronic anxiety (fear, worry, agitation). Goal date is 12-24.             Diagnoses:  GAD and Adjustment disorder with depression.   Plan of Care: Outpatient therapy (behavioral and insight oriented) and  psychotropic medication   Garrel Ridgel, PhD

## 2023-04-17 ENCOUNTER — Ambulatory Visit: Payer: Managed Care, Other (non HMO) | Admitting: Physician Assistant

## 2023-04-24 ENCOUNTER — Ambulatory Visit (INDEPENDENT_AMBULATORY_CARE_PROVIDER_SITE_OTHER): Payer: 59 | Admitting: Psychology

## 2023-04-24 DIAGNOSIS — F411 Generalized anxiety disorder: Secondary | ICD-10-CM | POA: Diagnosis not present

## 2023-04-24 NOTE — Progress Notes (Signed)
[facility] Counselor Initial Adult Exam  Name: Victoria Carson Date: 04/24/2023 MRN: [id number] DOB: 06/09/1996 PCP: Jarold Motto, PA     Victoria Carson participated from home, via video, and consented to treatment. Therapist participated from home office. We met online due to COVID pandemic.   Guardian/Payee:  N/A    Paperwork requested: No   Reason for Visit /Presenting Problem: Grief associated with the loss of her grandfather  Mental Status Exam: Appearance:   Casual     Behavior:  Appropriate  Motor:  Normal  Speech/Language:   Normal Rate  Affect:  Appropriate  Mood:  depressed  Thought process:  normal  Thought content:    WNL  Sensory/Perceptual disturbances:    WNL  Orientation:  oriented to person, place, and situation  Attention:  Good  Concentration:  Good  Memory:  WNL  Fund of knowledge:   Good  Insight:    Good  Judgment:   Good  Impulse Control:  Good    Reported Symptoms:  sadness, tearful, helpless  Risk Assessment: Danger to Self:  No Self-injurious Behavior: No Danger to Others: No Duty to Warn:no Physical Aggression / Violence:No  Access to Firearms a concern: No  Gang Involvement:No  Patient / guardian was educated about steps to take if suicide or homicide risk level increases between visits: n/a While future psychiatric events cannot be accurately predicted, the patient does not currently require acute inpatient psychiatric care and does not currently meet [facility] involuntary commitment criteria.  Substance Abuse History: Current substance abuse: No     Past Psychiatric History:   Previous psychological history is significant for anxiety and depression Outpatient Providers:Oaklie Durrett, Ph.D. History of Psych Hospitalization: No  Psychological Testing:  unknown    Abuse History:  Victim of: No.,  N/A    Report needed: No. Victim of  Neglect:No. Perpetrator of  N/A   Witness / Exposure to Domestic Violence: No   Protective Services Involvement: No  Witness to [employer] Violence:  No   Family History:  Family History  Problem Relation Age of Onset   Mental illness Mother    Drug abuse Mother    Healthy Father    Healthy Sister    Healthy Brother    Heart failure Other    Hypertension Other    Depression Paternal Aunt    Anxiety disorder Paternal Aunt    Stroke Paternal Grandfather    Liver disease Maternal Grandmother    Colon polyps Neg Hx    Esophageal cancer Neg Hx    Stomach cancer Neg Hx    Rectal cancer Neg Hx    Allergic rhinitis Neg Hx    Angioedema Neg Hx    Asthma Neg Hx    Atopy Neg Hx    Eczema Neg Hx    Immunodeficiency Neg Hx    Urticaria Neg Hx     Living situation: the patient lives with their spouse  Sexual Orientation: Straight  Relationship Status: married  Name of spouse / other:[name] If a parent, number of children / ages:N/A  Support Systems: spouse  Surveyor, quantity Stress:  No   Income/Employment/Disability: Employment  Financial planner: No   Educational History: Education: high school diploma/GED  Religion/Sprituality/World View: unknown  Any cultural differences that may affect / interfere with  treatment:  N/A  Recreation/Hobbies: unknown  Stressors: Loss of grandfather    Strengths: Family  Barriers:  unknown   Legal History: Pending legal issue / charges: The patient has no significant history of legal issues. History of legal issue / charges:  N/A  Medical History/Surgical History: not reviewed Past Medical History:  Diagnosis Date   Chronic cholecystitis 07/2016   Cyst of right Bartholin's gland 05/2020   Dental crown present    Difficulty swallowing pills    Dust allergy    Eczema    GERD (gastroesophageal reflux disease)    no current med.   Hypothyroidism (acquired)    IBS (irritable bowel syndrome)    Colonoscopy 09/04/2019 x 2 precanerous  polyps removed, repeat 3-5 yrs   Interstitial cystitis 2019   Migraines     Past Surgical History:  Procedure Laterality Date   CHOLECYSTECTOMY N/A 08/08/2016   Procedure: LAPAROSCOPIC CHOLECYSTECTOMY;  Surgeon: Jennel Miyamoto, MD;  Location: [facility];  Service: General;  Laterality: N/A;   WISDOM TOOTH EXTRACTION      Medications: Current Outpatient Medications  Medication Sig Dispense Refill   EPINEPHrine 0.3 mg/0.3 mL IJ SOAJ injection Inject 0.3 mLs (0.3 mg total) into the muscle as needed for anaphylaxis. (Patient not taking: Reported on 01/13/2023) 2 each 1   Fremanezumab-vfrm (AJOVY) 225 MG/1.5ML SOAJ Inject 225 mg into the skin every 28 (twenty-eight) days. 1.68 mL 5   meloxicam (MOBIC) 7.5 MG tablet Take 1 tablet (7.5 mg total) by mouth daily. (Patient not taking: Reported on 01/13/2023) 30 tablet 0   naproxen (NAPROSYN) 500 MG tablet Take 1 tablet (500 mg total) by mouth daily as needed. Take one time with Sumatriptan at onset of headache. (Patient not taking: Reported on 01/13/2023) 10 tablet 0   ondansetron (ZOFRAN-ODT) 4 MG disintegrating tablet Take 1 tablet (4 mg total) by mouth every 8 (eight) hours as needed for nausea or vomiting. 20 tablet 5   Rimegepant Sulfate (NURTEC) 75 MG TBDP Take 1 tablet (75 mg total) by mouth daily as needed. 8 tablet 11   No current facility-administered medications for this visit.    Allergies  Allergen Reactions   Citrus Itching and Other (See Comments)    SWEATING   Sulfa Antibiotics Nausea Only, Other (See Comments) and Nausea And Vomiting    FEVER   Initial session: Patient states that her grandfather passed away 04/10/23 second. She was seen by me in 2018 for outpatient therapy for depression. She was very close, as he had raised her since age 29. She had moved out in 2020, but saw him weekly. She says she is having a hard time accepting that he is gone. He is the first person close to her that she has lost. Grandfather  lived with her dad and going to visit isn't the same. She has not yet been to the grave, but plans to sometime soon. Her aunt is in possession of all of his material things and plans to pass them on to her. She4 has held on to his blanket and a few of his shirts. States she is feeling sad. She has been feeling like staying in bed all day and isn't even excited about upcoming trip to [place] (which she was previously very excited about). She says back in 2020, she had some depression when Covid 19 started. It "got better with time". Was not on medicine at the time.   She got married in 2021 and has changed jobs a  few times. Now has a good job she likes. She works from home, but has been able to keep up and feels supported from employer. 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 Husband's name is Victoria Carson and Married since Dec. 2021. The marriage has been good. 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 She says she and her sister now get along (did not in past), but has conflict with brother. He is 24. She states her father is doing okay, but she also sees he is stressed more than usual. She says that she has ongoing anxiety for years. One of her fears is that her father and Celine Ahr may die. Cannot remember the psychotropics she was on in the past. Infrequent alcohol. No recreational drugs. No hobbies. Has a number of friends who are supportive.  Suggested that she start to journal her memories of her grandfather.   Goals/Treatment plan: Patient is seeking strategies to manage the grief associated with the death of her grandfather. Wants to reduce her chronic sadness and improve ability to complete daily work and personal tasks. She is also seeking to reduce her symptoms of chronic anxiety (fear, worry, agitation). Goal date is 12-24.   Patient was seen via [facility]. She is at home and provider in his home office. Session Note: Says she is now more excited about her trip to [place].  She was nervous about the trip since grandfather's passing, but is more settled with the idea. She states she is in a constant state of anxiety and it helps to "plan in advance". We talked about making appropriate plans to help minimize feeling anxious.  We also discussed her IBS and how she has managed. I suggested some apps to help with relaxation. Gave her link for CBT-I Coach. Also talked about the app "Calm".  She hasn't started a journal yet, but has been thinking about her positive memories of her grandfather. Will begin journal before her trip.                 Diagnoses:  GAD and Adjustment disorder with depression.   Plan of Care: Outpatient therapy (behavioral and insight oriented) and psychotropic medication   Garrel Ridgel, PhD

## 2023-06-12 NOTE — Progress Notes (Deleted)
NEUROLOGY FOLLOW UP OFFICE NOTE  [name] [id number]  Assessment/Plan:   Migraine with aura, without status migrainosus, not intractable   Migraine prevention:  *** Migraine rescue:  Nurtec, Zofran ODT for nausea *** Limit use of pain relievers to no more than 2 days out of week to prevent risk of rebound or medication-overuse headache. Keep headache diary Follow up ***       Subjective:  Victoria Carson is a 27 year old female who follows up for migraines.  UPDATE: MRI of brain with and without contrast on 01/14/2023 personally reviewed was normal.  Started Ajovy *** Intensity:  *** Duration:  *** with Nurtec *** Frequency:  *** Frequency of abortive medication: *** Current NSAIDS/analgesics:  none Current triptans:  none Current ergotamine:  none Current anti-emetic:  Zofran ODT 4mg  *** Current muscle relaxants:  none Current Antihypertensive medications:  none Current Antidepressant medications:  none Current Anticonvulsant medications:  none Current anti-CGRP:  Ajovy, Nurtec PRN *** Current Vitamins/Herbal/Supplements:  none Current Antihistamines/Decongestants:  none Other therapy:  none Birth control:  none Other medications:  none     Caffeine:  1 to 2 cups coffee a week; 5-6 cans of Coke daily Alcohol:  once a week Smoker:  no Diet:  Does not drink much water.  5-6 cans of Coke daily.  1 to 2 fast food meals a week.  Does not skip meals Exercise:  no Depression:  no; Anxiety:  no Other pain:  no Sleep hygiene:  good  HISTORY:  Onset:  27 years old.  Initially infrequent. (One every 6 months)  Started increasing around 27 years old.   Location:  behind right eye Quality:  squeezing, throbbing Intensity:  severe.   Aura:  Typically gray sheet over one eye (either eye).  In December 2023, it involved both eyes lasting 30-60 minutes, sometimes unilateral numbness and tingling in one hand (side of the visual aura), one time involved the face.   Rarely, she will have the aura back-to-back, recurring 30 minutes later. Prodrome:  absent Postdrome:  poor concentration/focus and head soreness for a couple of days Associated symptoms:  Nausea, photophobia, phonophobia.  She denies associated unilateral weakness. Duration:  3 days (2 hours with Nurtec but with postdrome dull headache for another 2 days) Frequency:  once a month Frequency of abortive medication: none Triggers:  bright lights, glare Relieving factors:  sleeping Activity:  movement/activity aggravates   She has an MRI of the brain with and without contrast scheduled for tomorrow.   Past NSAIDS/analgesics:  ibuprofen, ketorolac IM, acetaminophen, meloxicam, naproxen Past abortive triptans:  rizatriptan, sumatriptan tab Past abortive ergotamine:  none Past muscle relaxants:  cyclobenzaprine, tizanidine Past anti-emetic:  ondansetron Past antihypertensive medications:  propranolol, metoprolol Past antidepressant medications:  venlafaxine, escitalopram, mirtazapine Past anticonvulsant medications:  lamotrigine Past anti-CGRP:  none Past vitamins/Herbal/Supplements:  none Past antihistamines/decongestants:  diphenhydramine, meclizine Other past therapies:  none    Family history of headache:  not on her father's side of the family (does not know about her mother's side of the family)  PAST MEDICAL HISTORY: Past Medical History:  Diagnosis Date   Chronic cholecystitis 07/2016   Cyst of right Bartholin's gland 05/2020   Dental crown present    Difficulty swallowing pills    Dust allergy    Eczema    GERD (gastroesophageal reflux disease)    no current med.   Hypothyroidism (acquired)    IBS (irritable bowel syndrome)    Colonoscopy 09/04/2019  x 2 precanerous polyps removed, repeat 3-5 yrs   Interstitial cystitis 2019   Migraines     MEDICATIONS: Current Outpatient Medications on File Prior to Visit  Medication Sig Dispense Refill   EPINEPHrine 0.3 mg/0.3 mL  IJ SOAJ injection Inject 0.3 mLs (0.3 mg total) into the muscle as needed for anaphylaxis. (Patient not taking: Reported on 01/13/2023) 2 each 1   Fremanezumab-vfrm (AJOVY) 225 MG/1.5ML SOAJ Inject 225 mg into the skin every 28 (twenty-eight) days. 1.68 mL 5   meloxicam (MOBIC) 7.5 MG tablet Take 1 tablet (7.5 mg total) by mouth daily. (Patient not taking: Reported on 01/13/2023) 30 tablet 0   naproxen (NAPROSYN) 500 MG tablet Take 1 tablet (500 mg total) by mouth daily as needed. Take one time with Sumatriptan at onset of headache. (Patient not taking: Reported on 01/13/2023) 10 tablet 0   ondansetron (ZOFRAN-ODT) 4 MG disintegrating tablet Take 1 tablet (4 mg total) by mouth every 8 (eight) hours as needed for nausea or vomiting. 20 tablet 5   Rimegepant Sulfate (NURTEC) 75 MG TBDP Take 1 tablet (75 mg total) by mouth daily as needed. 8 tablet 11   No current facility-administered medications on file prior to visit.    ALLERGIES: Allergies  Allergen Reactions   Citrus Itching and Other (See Comments)    SWEATING   Sulfa Antibiotics Nausea Only, Other (See Comments) and Nausea And Vomiting    FEVER    FAMILY HISTORY: Family History  Problem Relation Age of Onset   Mental illness Mother    Drug abuse Mother    Healthy Father    Healthy Sister    Healthy Brother    Heart failure Other    Hypertension Other    Depression Paternal Aunt    Anxiety disorder Paternal Aunt    Stroke Paternal Grandfather    Liver disease Maternal Grandmother    Colon polyps Neg Hx    Esophageal cancer Neg Hx    Stomach cancer Neg Hx    Rectal cancer Neg Hx    Allergic rhinitis Neg Hx    Angioedema Neg Hx    Asthma Neg Hx    Atopy Neg Hx    Eczema Neg Hx    Immunodeficiency Neg Hx    Urticaria Neg Hx       Objective:  *** General: No acute distress.  Patient appears ***-groomed.   Head:  Normocephalic/atraumatic Eyes:  Fundi examined but not visualized Neck: supple, no paraspinal tenderness, full  range of motion Heart:  Regular rate and rhythm Lungs:  Clear to auscultation bilaterally Back: No paraspinal tenderness Neurological Exam: alert and oriented.  Speech fluent and not dysarthric, language intact.  CN II-XII intact. Bulk and tone normal, muscle strength 5/5 throughout.  Sensation to light touch intact.  Deep tendon reflexes 2+ throughout, toes downgoing.  Finger to nose testing intact.  Gait normal, Romberg negative.   Shon Millet, DO  CC: ***

## 2023-06-13 ENCOUNTER — Encounter: Payer: Self-pay | Admitting: Neurology

## 2023-06-13 ENCOUNTER — Ambulatory Visit: Payer: Managed Care, Other (non HMO) | Admitting: Neurology

## 2023-06-13 DIAGNOSIS — Z029 Encounter for administrative examinations, unspecified: Secondary | ICD-10-CM

## 2023-06-16 ENCOUNTER — Encounter: Payer: Self-pay | Admitting: Physician Assistant

## 2023-06-16 ENCOUNTER — Ambulatory Visit (INDEPENDENT_AMBULATORY_CARE_PROVIDER_SITE_OTHER): Payer: Managed Care, Other (non HMO) | Admitting: Physician Assistant

## 2023-06-16 VITALS — BP 111/72 | HR 66 | Temp 97.0°F | Ht 69.0 in | Wt 175.8 lb

## 2023-06-16 DIAGNOSIS — R21 Rash and other nonspecific skin eruption: Secondary | ICD-10-CM

## 2023-06-16 DIAGNOSIS — E010 Iodine-deficiency related diffuse (endemic) goiter: Secondary | ICD-10-CM

## 2023-06-16 DIAGNOSIS — E041 Nontoxic single thyroid nodule: Secondary | ICD-10-CM | POA: Diagnosis not present

## 2023-06-16 LAB — T4, FREE: Free T4: 0.79 ng/dL (ref 0.60–1.60)

## 2023-06-16 LAB — TSH: TSH: 3.07 u[IU]/mL (ref 0.35–5.50)

## 2023-06-16 LAB — T3, FREE: T3, Free: 3 pg/mL (ref 2.3–4.2)

## 2023-06-16 MED ORDER — EPINEPHRINE 0.3 MG/0.3ML IJ SOAJ: 0.3000 mg | INTRAMUSCULAR | 1 refills | Status: AC | PRN

## 2023-06-16 NOTE — Patient Instructions (Signed)
It was great to see you!  We will place referral for thyroid imaging  We will complete thyroid blood work today  Trial triamcinolone to the rash -- if worsening, let me know and we will send in another ointment If no improvement - reach out to [place] or dermatology  Take care,  Jarold Motto PA-C

## 2023-06-16 NOTE — Progress Notes (Signed)
Victoria Carson is a 27 y.o. female here for a follow up of a pre-existing problem.  History of Present Illness:   Chief Complaint  Patient presents with   Hypothyroidism    Requesting [place] TSH     HPI  Hypothyroidism Patient is requesting to have her TSH tested this visit. She would also like to repeat her thyroid ultrasound as was indicated on her last thyroid ultrasound from last year to have this repeated Denies any major concerns She is planning for possible pregnancy soon  Anaphylaxis Patient is requesting a refill on 0.3 mg/0.3 ml Epinephrine injection. Has not required use of this.  Rash on left leg Patient is complaining of an itchy rash on lower left leg for 4-5 months. She states that it has not gotten better over time. There was no trauma, patient reports that it randomly appeared. Manages sxs with lotion.  Past Medical History:  Diagnosis Date   Chronic cholecystitis 07/2016   Cyst of right Bartholin's gland 05/2020   Dental crown present    Difficulty swallowing pills    Dust allergy    Eczema    GERD (gastroesophageal reflux disease)    no current med.   Hypothyroidism (acquired)    IBS (irritable bowel syndrome)    Colonoscopy 09/04/2019 x 2 precanerous polyps removed, repeat 3-5 yrs   Interstitial cystitis 2019   Migraines      Social History   Tobacco Use   Smoking status: Never   Smokeless tobacco: Never  Vaping Use   Vaping Use: Never used  Substance Use Topics   Alcohol use: Yes    Comment: occassional   Drug use: No    Past Surgical History:  Procedure Laterality Date   CHOLECYSTECTOMY N/A 08/08/2016   Procedure: LAPAROSCOPIC CHOLECYSTECTOMY;  Surgeon: Jaynia Miyamoto, MD;  Location: [facility];  Service: General;  Laterality: N/A;   WISDOM TOOTH EXTRACTION      Family History  Problem Relation Age of Onset   Mental illness Mother    Drug abuse Mother    Healthy Father    Healthy Sister    Healthy Brother    Heart  failure Other    Hypertension Other    Depression Paternal Aunt    Anxiety disorder Paternal Aunt    Stroke Paternal Grandfather    Liver disease Maternal Grandmother    Colon polyps Neg Hx    Esophageal cancer Neg Hx    Stomach cancer Neg Hx    Rectal cancer Neg Hx    Allergic rhinitis Neg Hx    Angioedema Neg Hx    Asthma Neg Hx    Atopy Neg Hx    Eczema Neg Hx    Immunodeficiency Neg Hx    Urticaria Neg Hx     Allergies  Allergen Reactions   Citrus Itching and Other (See Comments)    SWEATING   Sulfa Antibiotics Nausea Only, Other (See Comments) and Nausea And Vomiting    FEVER    Current Medications:   Current Outpatient Medications:    EPINEPHrine 0.3 mg/0.3 mL IJ SOAJ injection, Inject 0.3 mLs (0.3 mg total) into the muscle as needed for anaphylaxis., Disp: 2 each, Rfl: 1   Review of Systems:   ROS Negative unless otherwise specified per HPI.   Vitals:   Vitals:   06/16/23 1101  BP: 111/72  Pulse: 66  Temp: (!) 97 F (36.1 C)  TempSrc: Temporal  SpO2: 98%  Weight: 175 lb 12.8  oz (79.7 kg)  Height: 5\' 9"  (1.753 m)     Body mass index is 25.96 kg/m.  Physical Exam:   Physical Exam Vitals and nursing note reviewed.  Constitutional:      General: She is not in acute distress.    Appearance: Normal appearance. She is well-developed. She is not ill-appearing or toxic-appearing.  HENT:     Head: Normocephalic and atraumatic.     Right Ear: Tympanic membrane, ear canal and external ear normal. Tympanic membrane is not erythematous, retracted or bulging.     Left Ear: Tympanic membrane, ear canal and external ear normal. Tympanic membrane is not erythematous, retracted or bulging.     Nose: Nose normal.     Right Sinus: No maxillary sinus tenderness or frontal sinus tenderness.     Left Sinus: No maxillary sinus tenderness or frontal sinus tenderness.     Mouth/Throat:     Pharynx: Uvula midline. No posterior oropharyngeal erythema.  Eyes:      General: Lids are normal.     Extraocular Movements: Extraocular movements intact.     Conjunctiva/sclera: Conjunctivae normal.     Pupils: Pupils are equal, round, and reactive to light.  Neck:     Thyroid: Thyromegaly present.     Trachea: Trachea normal.  Cardiovascular:     Rate and Rhythm: Normal rate and regular rhythm.     Heart sounds: Normal heart sounds, S1 normal and S2 normal. No murmur heard.    No gallop.  Pulmonary:     Effort: Pulmonary effort is normal. No respiratory distress.     Breath sounds: Normal breath sounds. No decreased breath sounds, wheezing, rhonchi or rales.  Lymphadenopathy:     Cervical: No cervical adenopathy.  Skin:    General: Skin is warm and dry.     Comments: Right lower leg with approximately 2 in raised erythematous plaque without induration or fluctuance  Neurological:     Mental Status: She is alert and oriented to person, place, and time.  Psychiatric:        Speech: Speech normal.        Behavior: Behavior normal. Behavior is cooperative.        Judgment: Judgment normal.     Assessment and Plan:   Rash Possible dermatitis Trial topical steroid cream (she has this at home) If no improvement, reach out and we may trial topical ketoconazole vs biopsy vs referral to derm  Thyromegaly; Thyroid nodule Update blood work and thyroid ultrasound Low threshold to refer back to endo if any concerns due to planning for pregnancy soon  I,Verona Buck,acting as a Neurosurgeon for [employer], PA.,have documented all relevant documentation on the behalf of Jarold Motto, PA,as directed by  Jarold Motto, PA while in the presence of Jarold Motto, [place].  I, Jarold Motto, [place], have reviewed all documentation for this visit. The documentation on 06/16/23 for the exam, diagnosis, procedures, and orders are all accurate and complete.  Jarold Motto, PA-C

## 2023-06-22 ENCOUNTER — Other Ambulatory Visit: Payer: Managed Care, Other (non HMO)

## 2023-06-30 LAB — HM PAP SMEAR

## 2023-07-20 LAB — HM COLONOSCOPY

## 2023-07-24 ENCOUNTER — Other Ambulatory Visit: Payer: Managed Care, Other (non HMO)

## 2023-09-07 LAB — OB RESULTS CONSOLE GC/CHLAMYDIA
Chlamydia: NEGATIVE
Neisseria Gonorrhea: NEGATIVE

## 2023-09-09 ENCOUNTER — Inpatient Hospital Stay (HOSPITAL_COMMUNITY)
Admission: AD | Admit: 2023-09-09 | Discharge: 2023-09-09 | Disposition: A | Discharge status: Home / Self Care | Payer: Managed Care, Other (non HMO) | Attending: Obstetrics & Gynecology | Admitting: Obstetrics & Gynecology

## 2023-09-09 ENCOUNTER — Encounter (HOSPITAL_COMMUNITY): Payer: Self-pay | Admitting: *Deleted

## 2023-09-09 DIAGNOSIS — O26891 Other specified pregnancy related conditions, first trimester: Secondary | ICD-10-CM | POA: Diagnosis not present

## 2023-09-09 DIAGNOSIS — Z3A01 Less than 8 weeks gestation of pregnancy: Secondary | ICD-10-CM | POA: Diagnosis not present

## 2023-09-09 DIAGNOSIS — R519 Headache, unspecified: Secondary | ICD-10-CM | POA: Insufficient documentation

## 2023-09-09 DIAGNOSIS — R030 Elevated blood-pressure reading, without diagnosis of hypertension: Secondary | ICD-10-CM | POA: Diagnosis present

## 2023-09-09 MED ORDER — ACETAMINOPHEN 500 MG PO TABS
1000.0000 mg | ORAL_TABLET | Freq: Once | ORAL | Status: AC
  Administered 2023-09-09: 1000 mg via ORAL
  Filled 2023-09-09: qty 2

## 2023-09-09 NOTE — MAU Provider Note (Signed)
Chief Complaint: Headache   Event Date/Time   First Provider Initiated Contact with Patient 09/09/23 0251      SUBJECTIVE HPI: Victoria Carson is a 27 y.o. G1P0 at [redacted]w[redacted]d  who presents to maternity admissions reporting h/a over right eye starting at 9 pm, right arm felt funny, then BP 196/106 at home.   She denies vaginal bleeding, vaginal itching/burning, urinary symptoms, dizziness, n/v, or fever/chills.    HPI  Past Medical History:  Diagnosis Date   Chronic cholecystitis 07/2016   Cyst of right Bartholin's gland 05/2020   Dental crown present    Difficulty swallowing pills    Dust allergy    Eczema    GERD (gastroesophageal reflux disease)    no current med.   Hypothyroidism (acquired)    IBS (irritable bowel syndrome)    Colonoscopy 09/04/2019 x 2 precanerous polyps removed, repeat 3-5 yrs   Interstitial cystitis 2019   Migraines    Past Surgical History:  Procedure Laterality Date   CHOLECYSTECTOMY N/A 08/08/2016   Procedure: LAPAROSCOPIC CHOLECYSTECTOMY;  Surgeon: Ishitha Miyamoto, MD;  Location: [facility];  Service: General;  Laterality: N/A;   WISDOM TOOTH EXTRACTION     Social History   Socioeconomic History   Marital status: Married    Spouse name: n/a   Number of children: 0   Years of education: Not on file   Highest education level: Not on file  Occupational History   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 She currently lives in [place] with her father, grandfather, older sister, and younger brother. She currently is homeschooled and in the 10th grade. She has a  boyfriend of 2 months. 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 2 years ago, she was caught shoplifting. She reports that her social support system consists of a friend. 05/01/2013 AHW      Single. Lives with father.    Student for pre-nursing at [employer]   Enjoys spending time with her friends.   Works at [employer] (primary care)   Right handed   Drinks caffeine   Two story apartment   Lives with husband   Currently working            Social Determinants of Health   Financial Resource Strain: Not on file  Food Insecurity: Not on file  Transportation Needs: Not on file  Physical Activity: Not on file  Stress: Not on file  Social Connections: Unknown (04/25/2022)   Received from [facility]   Social Network    Social Network: Not on file  Intimate Partner Violence: Unknown (03/17/2022)   Received from [facility], [facility]   HITS    Physically Hurt: Not on file    Insult or Talk Down To: Not on file    Threaten Physical Harm: Not on file    Scream or Curse: Not on file   No current facility-administered medications on file prior to encounter.   Current Outpatient Medications on File Prior to Encounter  Medication Sig Dispense Refill   Prenatal Vit-Fe Fumarate-FA (PRENATAL MULTIVITAMIN) TABS tablet Take 1 tablet by mouth daily at 12 noon.     pyridOXINE (VITAMIN B6) 25 MG tablet Take 25 mg by mouth 3 (three) times daily before meals.     EPINEPHrine 0.3 mg/0.3 mL IJ SOAJ injection Inject 0.3 mg into the muscle as needed for anaphylaxis. 2 each 1   Allergies  Allergen Reactions   Citrus Itching and Other (See Comments)    SWEATING   Sulfa Antibiotics Nausea Only, Other (See Comments) and Nausea And Vomiting    FEVER    ROS:  Review of Systems   I have reviewed patient's Past Medical Hx, Surgical Hx, Family Hx, Social Hx, medications and allergies.   Physical Exam   BP 112/63   Pulse 92   Temp 98.4 F  (36.9 C) (Oral)   Resp 14   Ht 5\' 9"  (1.753 m)   Wt 79.2 kg   LMP 07/20/2023   BMI 25.77 kg/m      Constitutional: Well-developed, well-nourished female in no acute distress.  Cardiovascular: normal rate Respiratory: normal effort GI: Abd soft, non-tender. Pos BS x 4 MS: Extremities nontender, no edema, normal ROM Neurologic: Alert and oriented x 4.  GU: Neg CVAT.  PELVIC EXAM:Deferred   LAB RESULTS No results found for this or any previous visit (from the past 24 hour(s)).     IMAGING No results found.  MAU Management/MDM: Orders Placed This Encounter  Procedures   Discharge patient    Meds ordered this encounter  Medications   acetaminophen (TYLENOL) tablet 1,000 mg    BP wnl in MAU, no s/sx of PEC.  D/C home.  Keep scheduled appt in the office. Return to MAU as needed for emergencies.     ASSESSMENT 1. Pregnancy headache in first trimester   2. [redacted] weeks gestation of pregnancy     PLAN Discharge home Allergies as of 09/09/2023       Reactions   Citrus Itching, Other (See Comments)   SWEATING   Sulfa Antibiotics Nausea Only, Other (See Comments), Nausea And Vomiting   FEVER        Medication List     TAKE these medications    EPINEPHrine 0.3 mg/0.3 mL Soaj injection Commonly known as: EPI-PEN Inject 0.3 mg into the muscle as needed for anaphylaxis.   prenatal multivitamin Tabs tablet Take 1 tablet by mouth daily at 12 noon.   pyridOXINE 25 MG tablet Commonly known as: VITAMIN B6 Take 25 mg by mouth 3 (three) times daily before meals.        Follow-up Information     Ob/Gyn, [facility] Follow up.   Why: As scheduled Contact information: [address] [place] [address] [place] [address] [phone]         [facility] Follow up.   Specialty: Obstetrics and Gynecology Why: As needed for emergencies Contact information: [address] [place] [address] [phone]                 Sharen Counter Certified Nurse-Midwife 09/10/2023  6:49 AM

## 2023-09-09 NOTE — MAU Note (Signed)
Pt says she has H/A - over right eye- started  9pm- arm felt funny-  No meds for H/A  Went to sister-in- law- took BP at [facility] - 196/95 No hx of BP problems  Now - H/A - 8/10 - gets migraines - loses eye sight . Last H/A - last Thursday - she slept through it - no meds . [facility]- Dr Timothy Lasso

## 2023-10-05 LAB — HEMOGLOBIN A1C: Hemoglobin A1C: 5

## 2023-10-05 LAB — HM HEPATITIS C SCREENING LAB: HM Hepatitis Screen: NEGATIVE

## 2023-10-05 LAB — CBC AND DIFFERENTIAL
HCT: 37 (ref 36–46)
Hemoglobin: 11.9 — AB (ref 12.0–16.0)
Platelets: 255 10*3/uL (ref 150–400)

## 2023-10-05 LAB — HIV ANTIBODY (ROUTINE TESTING W REFLEX): HIV 1&2 Ab, 4th Generation: NONREACTIVE

## 2023-10-05 LAB — HEPATITIS B SURFACE ANTIGEN: Hepatitis B Surface Ag: NEGATIVE

## 2023-10-05 LAB — HM HIV SCREENING LAB: HM HIV Screening: NEGATIVE

## 2023-10-05 LAB — OB RESULTS CONSOLE RUBELLA ANTIBODY, IGM: Rubella: IMMUNE

## 2023-10-17 ENCOUNTER — Encounter: Payer: Self-pay | Admitting: Family Medicine

## 2023-10-17 ENCOUNTER — Ambulatory Visit: Payer: Managed Care, Other (non HMO) | Admitting: Family Medicine

## 2023-10-17 VITALS — BP 108/78 | HR 100 | Temp 98.6°F | Ht 69.0 in | Wt 165.2 lb

## 2023-10-17 DIAGNOSIS — Z1152 Encounter for screening for COVID-19: Secondary | ICD-10-CM | POA: Diagnosis not present

## 2023-10-17 DIAGNOSIS — J029 Acute pharyngitis, unspecified: Secondary | ICD-10-CM | POA: Diagnosis not present

## 2023-10-17 LAB — POC COVID19 BINAXNOW: SARS Coronavirus 2 Ag: NEGATIVE

## 2023-10-17 LAB — POCT RAPID STREP A (OFFICE): Rapid Strep A Screen: NEGATIVE

## 2023-10-17 NOTE — Progress Notes (Unsigned)
Established Patient Office Visit   Subjective:  Patient ID: Victoria Carson, female    DOB: Jun 23, 1996  Age: 27 y.o. MRN: [id number]  Chief Complaint  Patient presents with   Sore Throat    Sire throat x 1 day. Right side of throat. Hurts to swallow. Toungue soreness.     HPI Encounter Diagnoses  Name Primary?   Encounter for screening for COVID-19 Yes   Sore throat    Viral pharyngitis    1 day history of sore throat.  Right side is affected more than the left.  Mild headache.  No fevers or chills.  No cough.  There have been some myalgias.  G1 P0  [redacted] weeks pregnant.  {History (Optional):23778}  Review of Systems  Constitutional: Negative.   HENT:  Positive for sinus pain. Negative for congestion and ear pain.   Eyes:  Negative for blurred vision, discharge and redness.  Respiratory: Negative.  Negative for cough.   Cardiovascular: Negative.   Gastrointestinal:  Negative for abdominal pain.  Genitourinary: Negative.   Musculoskeletal:  Positive for myalgias. Negative for joint pain.  Skin:  Negative for rash.  Neurological:  Positive for headaches. Negative for tingling, loss of consciousness and weakness.  Endo/Heme/Allergies:  Negative for polydipsia.     Current Outpatient Medications:    EPINEPHrine 0.3 mg/0.3 mL IJ SOAJ injection, Inject 0.3 mg into the muscle as needed for anaphylaxis., Disp: 2 each, Rfl: 1   Prenatal Vit-Fe Fumarate-FA (PRENATAL MULTIVITAMIN) TABS tablet, Take 1 tablet by mouth daily at 12 noon., Disp: , Rfl:    pyridOXINE (VITAMIN B6) 25 MG tablet, Take 25 mg by mouth 3 (three) times daily before meals., Disp: , Rfl:    Objective:     BP 108/78   Pulse 100   Temp 98.6 F (37 C)   Ht 5\' 9"  (1.753 m)   Wt 165 lb 3.2 oz (74.9 kg)   LMP 07/20/2023   SpO2 98%   BMI 24.40 kg/m  {Vitals History (Optional):23777}  Physical Exam Constitutional:      General: She is not in acute distress.    Appearance: Normal appearance. She is not  ill-appearing, toxic-appearing or diaphoretic.  HENT:     Head: Normocephalic and atraumatic.     Right Ear: Tympanic membrane, ear canal and external ear normal.     Left Ear: Tympanic membrane, ear canal and external ear normal.     Mouth/Throat:     Mouth: Mucous membranes are moist. No oral lesions.     Pharynx: Oropharynx is clear. No pharyngeal swelling, oropharyngeal exudate or posterior oropharyngeal erythema.     Tonsils: No tonsillar exudate or tonsillar abscesses. 0 on the right.  Eyes:     General: No scleral icterus.       Right eye: No discharge.        Left eye: No discharge.     Extraocular Movements: Extraocular movements intact.     Conjunctiva/sclera: Conjunctivae normal.     Pupils: Pupils are equal, round, and reactive to light.  Neck:     Thyroid: No thyromegaly.  Cardiovascular:     Rate and Rhythm: Normal rate and regular rhythm.  Pulmonary:     Effort: Pulmonary effort is normal. No respiratory distress.     Breath sounds: Normal breath sounds. No wheezing or rales.  Abdominal:     General: Bowel sounds are normal.     Tenderness: There is no abdominal tenderness. There is no guarding.  Musculoskeletal:     Cervical back: Neck supple. No rigidity or tenderness.  Lymphadenopathy:     Cervical: No cervical adenopathy.  Skin:    General: Skin is warm and dry.     Findings: No rash.  Neurological:     Mental Status: She is alert and oriented to person, place, and time.  Psychiatric:        Mood and Affect: Mood normal.        Behavior: Behavior normal.      Results for orders placed or performed in visit on 10/17/23  POCT rapid strep A  Result Value Ref Range   Rapid Strep A Screen Negative Negative  POC COVID-19 BinaxNow  Result Value Ref Range   SARS Coronavirus 2 Ag Negative Negative    {Labs (Optional):23779}  The ASCVD Risk score (Arnett DK, et al., 2019) failed to calculate for the following reasons:   The 2019 ASCVD risk score is only  valid for ages 22 to 56    Assessment & Plan:   Encounter for screening for COVID-19 -     POC COVID-19 BinaxNow  Sore throat -     POCT rapid strep A  Viral pharyngitis    Return in about 1 week (around 10/24/2023), or if symptoms worsen or fail to improve.  Strep and COVID test were negative.  Discussed local measures such as salt water gargles, Tylenol and Cepacol lozenges.  She has follow-up with her OB on Friday.  Mliss Sax, MD

## 2023-10-18 ENCOUNTER — Encounter: Payer: Self-pay | Admitting: Family Medicine

## 2023-10-18 ENCOUNTER — Ambulatory Visit: Payer: Managed Care, Other (non HMO) | Admitting: Physician Assistant

## 2023-12-13 NOTE — L&D Delivery Note (Signed)
 Delivery Note Victoria Carson is a 28 y.o. G1 now P1001 who had a vacuum assisted vaginal delivery [redacted]w[redacted]d after IOL for PROM.  At 12:27pm a healthy baby girl was delivered via  (Presentation: vertex ; LOA ).  APGAR:8 , 9; weight pending at the time of note writing .     Induction of labor for PROM. Induced with cytotec, pitocin. Progressed rapidly after 6cm. Received epidural for pain management. Due to prolonged fetal heart rate deceleration, a Kiwi vacuum was placed at the flexion point on the fetal head at 3+ fetal station. Vacuum was engaged. Patient pushed well for 2 minutes, pulls performed over two contractions, no pop-offs.Victoria Carson was delivered without difficulty. No nuchal cord. Vacuum was disengaged from the baby's head and she was placed on the maternal abdomen. Delayed cord clamping for 30 seconds, placenta was noted to be delivering already. Cord blood and arterial cord gas collected. Delivery of placenta was spontaneous. Placenta was found to be intact, with approximately 50cc clot behind it, 3-vessel cord was noted. The fundus was found to be firm. 1st degree perineal laceration was repaired in the normal sterile fashion with 3-0 vicryl. Estimated blood loss 77cc. Cord gases were sent. Instrument and gauze counts were correct at the end of the procedure. Of note, suction site on baby's head was midline-left on flexion point. Fundus was firm, bleeding was minimal, and both mother and baby were doing well when I left the room.   Placenta status: appeared to have abruption, L&D.  Cord: 3v, no nuchal. Cord pH: pending at the time of note writing  Anesthesia:  epidural Episiotomy:  no Lacerations:  1st degree Suture Repair: 3.0 vicryl rapide Est. Blood Loss (mL):  77cc  Mom to postpartum.  Baby to Couplet care / Skin to Skin.  Victoria Carson 04/26/2024, 1:00 PM

## 2024-01-02 ENCOUNTER — Inpatient Hospital Stay (HOSPITAL_COMMUNITY)
Admission: AD | Admit: 2024-01-02 | Discharge: 2024-01-02 | Disposition: A | Discharge status: Home / Self Care | Payer: Managed Care, Other (non HMO) | Attending: Student | Admitting: Student

## 2024-01-02 ENCOUNTER — Encounter (HOSPITAL_COMMUNITY): Payer: Self-pay | Admitting: Student

## 2024-01-02 DIAGNOSIS — O26892 Other specified pregnancy related conditions, second trimester: Secondary | ICD-10-CM

## 2024-01-02 DIAGNOSIS — R112 Nausea with vomiting, unspecified: Secondary | ICD-10-CM

## 2024-01-02 DIAGNOSIS — O99612 Diseases of the digestive system complicating pregnancy, second trimester: Secondary | ICD-10-CM | POA: Diagnosis not present

## 2024-01-02 DIAGNOSIS — Z3689 Encounter for other specified antenatal screening: Secondary | ICD-10-CM | POA: Diagnosis not present

## 2024-01-02 DIAGNOSIS — O212 Late vomiting of pregnancy: Secondary | ICD-10-CM | POA: Insufficient documentation

## 2024-01-02 DIAGNOSIS — R197 Diarrhea, unspecified: Secondary | ICD-10-CM

## 2024-01-02 DIAGNOSIS — Z3A23 23 weeks gestation of pregnancy: Secondary | ICD-10-CM | POA: Diagnosis not present

## 2024-01-02 DIAGNOSIS — K580 Irritable bowel syndrome with diarrhea: Secondary | ICD-10-CM | POA: Insufficient documentation

## 2024-01-02 LAB — URINALYSIS, ROUTINE W REFLEX MICROSCOPIC
Bilirubin Urine: NEGATIVE
Glucose, UA: NEGATIVE mg/dL
Hgb urine dipstick: NEGATIVE
Ketones, ur: 20 mg/dL — AB
Leukocytes,Ua: NEGATIVE
Nitrite: NEGATIVE
Protein, ur: 30 mg/dL — AB
Specific Gravity, Urine: 1.03 (ref 1.005–1.030)
pH: 5 (ref 5.0–8.0)

## 2024-01-02 LAB — CBC
HCT: 36.9 % (ref 36.0–46.0)
Hemoglobin: 12.5 g/dL (ref 12.0–15.0)
MCH: 31.6 pg (ref 26.0–34.0)
MCHC: 33.9 g/dL (ref 30.0–36.0)
MCV: 93.4 fL (ref 80.0–100.0)
Platelets: 218 10*3/uL (ref 150–400)
RBC: 3.95 MIL/uL (ref 3.87–5.11)
RDW: 13 % (ref 11.5–15.5)
WBC: 12.8 10*3/uL — ABNORMAL HIGH (ref 4.0–10.5)
nRBC: 0 % (ref 0.0–0.2)

## 2024-01-02 LAB — COMPREHENSIVE METABOLIC PANEL
ALT: 12 U/L (ref 0–44)
AST: 15 U/L (ref 15–41)
Albumin: 2.8 g/dL — ABNORMAL LOW (ref 3.5–5.0)
Alkaline Phosphatase: 65 U/L (ref 38–126)
Anion gap: 9 (ref 5–15)
BUN: 7 mg/dL (ref 6–20)
CO2: 21 mmol/L — ABNORMAL LOW (ref 22–32)
Calcium: 8.5 mg/dL — ABNORMAL LOW (ref 8.9–10.3)
Chloride: 105 mmol/L (ref 98–111)
Creatinine, Ser: 0.58 mg/dL (ref 0.44–1.00)
GFR, Estimated: >60 mL/min (ref >60)
Glucose, Bld: 98 mg/dL (ref 70–99)
Potassium: 4.1 mmol/L (ref 3.5–5.1)
Sodium: 135 mmol/L (ref 135–145)
Total Bilirubin: 0.5 mg/dL (ref 0.0–1.2)
Total Protein: 5.9 g/dL — ABNORMAL LOW (ref 6.5–8.1)

## 2024-01-02 MED ORDER — PROMETHAZINE HCL 25 MG PO TABS: 25.0000 mg | ORAL_TABLET | Freq: Four times a day (QID) | ORAL | 0 refills | Status: DC | PRN

## 2024-01-02 MED ORDER — ONDANSETRON 4 MG PO TBDP
8.0000 mg | ORAL_TABLET | Freq: Once | ORAL | Status: AC
  Administered 2024-01-02: 8 mg via ORAL
  Filled 2024-01-02: qty 2

## 2024-01-02 MED ORDER — ONDANSETRON 4 MG PO TBDP: 4.0000 mg | ORAL_TABLET | Freq: Four times a day (QID) | ORAL | 0 refills | Status: DC | PRN

## 2024-01-02 MED ORDER — LACTATED RINGERS IV BOLUS
1000.0000 mL | Freq: Once | INTRAVENOUS | Status: AC
  Administered 2024-01-02: 1000 mL via INTRAVENOUS

## 2024-01-02 MED ORDER — PROMETHAZINE HCL 25 MG PO TABS
25.0000 mg | ORAL_TABLET | Freq: Once | ORAL | Status: AC
  Administered 2024-01-02: 25 mg via ORAL
  Filled 2024-01-02: qty 1

## 2024-01-02 NOTE — MAU Note (Signed)
Pt vomiting in triage currently.

## 2024-01-02 NOTE — MAU Provider Note (Signed)
History     CSN: [id number]  Arrival date and time: 01/02/24 0840   Event Date/Time   First Provider Initiated Contact with Patient 01/02/24 [phone]     Chief Complaint  Patient presents with   Nausea   Emesis   Diarrhea   HPI Patient is 28 y.o. G1P0 [redacted]w[redacted]d here with complaints of sudden onset of vomiting and diarrhea. Reports it started at 2 AM and has continued. Reports 12 episodes of vomiting and 12 episodes of diarrhea since 2 AM.   +FM, denies LOF, VB, contractions, vaginal discharge.    OB History     Gravida  1   Para      Term      Preterm      AB      Living         SAB      IAB      Ectopic      Multiple      Live Births              Past Medical History:  Diagnosis Date   Chronic cholecystitis 07/2016   Cyst of right Bartholin's gland 05/2020   Dental crown present    Difficulty swallowing pills    Dust allergy    Eczema    GERD (gastroesophageal reflux disease)    no current med.   Hypothyroidism (acquired)    IBS (irritable bowel syndrome)    Colonoscopy 09/04/2019 x 2 precanerous polyps removed, repeat 3-5 yrs   Interstitial cystitis 2019   Migraines     Past Surgical History:  Procedure Laterality Date   CHOLECYSTECTOMY N/A 08/08/2016   Procedure: LAPAROSCOPIC CHOLECYSTECTOMY;  Surgeon: Rande Miyamoto, MD;  Location: [facility];  Service: General;  Laterality: N/A;   WISDOM TOOTH EXTRACTION      Family History  Problem Relation Age of Onset   Mental illness Mother    Drug abuse Mother    Healthy Father    Healthy Sister    Healthy Brother    Heart failure Other    Hypertension Other    Depression Paternal Aunt    Anxiety disorder Paternal Aunt    Stroke Paternal Grandfather    Liver disease Maternal Grandmother    Colon polyps Neg Hx    Esophageal cancer Neg Hx    Stomach cancer Neg Hx    Rectal cancer Neg Hx    Allergic rhinitis Neg Hx    Angioedema Neg Hx    Asthma Neg Hx    Atopy Neg Hx     Eczema Neg Hx    Immunodeficiency Neg Hx    Urticaria Neg Hx     Social History   Tobacco Use   Smoking status: Never   Smokeless tobacco: Never  Vaping Use   Vaping status: Never Used  Substance Use Topics   Alcohol use: Not Currently    Comment: occassional   Drug use: No    Allergies:  Allergies  Allergen Reactions   Citrus Itching and Other (See Comments)    SWEATING   Sulfa Antibiotics Nausea Only, Other (See Comments) and Nausea And Vomiting    FEVER    Medications Prior to Admission  Medication Sig Dispense Refill Last Dose/Taking   Prenatal Vit-Fe Fumarate-FA (PRENATAL MULTIVITAMIN) TABS tablet Take 1 tablet by mouth daily at 12 noon.   01/01/2024 Morning   EPINEPHrine 0.3 mg/0.3 mL IJ SOAJ injection Inject 0.3 mg into the muscle as needed for anaphylaxis.  2 each 1    pyridOXINE (VITAMIN B6) 25 MG tablet Take 25 mg by mouth 3 (three) times daily before meals.   More than a month    Review of Systems  Constitutional:  Negative for chills and fever.  HENT:  Negative for congestion and sore throat.   Eyes:  Negative for pain and visual disturbance.  Respiratory:  Negative for cough, chest tightness and shortness of breath.   Cardiovascular:  Negative for chest pain.  Gastrointestinal:  Positive for diarrhea, nausea and vomiting. Negative for abdominal pain.  Endocrine: Negative for cold intolerance and heat intolerance.  Genitourinary:  Negative for dysuria and flank pain.  Musculoskeletal:  Negative for back pain.  Skin:  Negative for rash.  Allergic/Immunologic: Negative for food allergies.  Neurological:  Negative for dizziness and light-headedness.  Psychiatric/Behavioral:  Negative for agitation.    Physical Exam   Blood pressure 119/63, pulse (!) 117, temperature 97.7 F (36.5 C), resp. rate 18, height 5\' 9"  (1.753 m), weight 74.8 kg, last menstrual period 07/20/2023, SpO2 98%.  Patient Vitals for the past 24 hrs:  BP Temp Pulse Resp SpO2 Height Weight   01/02/24 1204 119/63 -- (!) 117 18 -- -- --  01/02/24 0915 114/67 -- (!) 130 18 98 % -- --  01/02/24 0855 118/66 97.7 F (36.5 C) (!) 123 18 -- 5\' 9"  (1.753 m) 74.8 kg    Physical Exam Vitals and nursing note reviewed.  Constitutional:      General: She is not in acute distress.    Appearance: She is well-developed.     Comments: Pregnant female. Somewhat ill appearing, no actively vomiting.   HENT:     Head: Normocephalic and atraumatic.     Mouth/Throat:     Mouth: Mucous membranes are dry.  Eyes:     General: No scleral icterus.    Conjunctiva/sclera: Conjunctivae normal.  Cardiovascular:     Rate and Rhythm: Normal rate.  Pulmonary:     Effort: Pulmonary effort is normal.  Chest:     Chest wall: No tenderness.  Abdominal:     Palpations: Abdomen is soft.     Tenderness: There is no abdominal tenderness. There is no guarding or rebound.     Comments: Gravid  Genitourinary:    Vagina: Normal.  Musculoskeletal:        General: Normal range of motion.     Cervical back: Normal range of motion and neck supple.  Skin:    General: Skin is warm and dry.     Findings: No rash.  Neurological:     Mental Status: She is alert and oriented to person, place, and time.    MAU Course  Procedures I reviewed the patient's fetal monitoring.  Baseline HR: 150 Variability:  moderate Accels:present Decels: none  A/P: Reactive NST  Reassured regarding fetal status.     MDM: high  This patient presents to the ED for concern of   Chief Complaint  Patient presents with   Nausea   Emesis   Diarrhea     These complains involves an extensive number of treatment options, and is a complaint that carries with it a high risk of complications and morbidity.  The differential diagnosis for  1. N/V/D INCLUDES most likely infectious vs food borne.    Co morbidities that complicate the patient evaluation: Patient Active Problem List   Diagnosis Date Noted   Viral pharyngitis  10/17/2023   Dyspareunia in female 11/19/2019   Knee  pain, bilateral 10/03/2019   Change in bowel habits 08/22/2019   Chest pain 07/31/2019   Vitamin D deficiency 07/31/2019   Lower extremity weakness 06/05/2019   Palpitations 03/12/2019   Hypothyroidism due to Hashimoto's thyroiditis 01/24/2019   Fatigue 07/06/2018   Acne vulgaris 10/25/2017   Interstitial cystitis 10/17/2016   Encounter for screening for COVID-19 10/17/2016   MDD (major depressive disorder), recurrent episode, severe (HCC) 02/15/2013   GAD (generalized anxiety disorder) 02/15/2013     Additional history obtained from mother  Interpreter services used: not applicable  External records from outside source obtained and reviewed including CareEverywhere and Prenatal care records  Lab Tests: I ordered, and personally interpreted labs.  The pertinent results include:   Results for orders placed or performed during the hospital encounter of 01/02/24 (from the past 24 hours)  Urinalysis, Routine w reflex microscopic -Urine, Clean Catch     Status: Abnormal   Collection Time: 01/02/24  8:56 AM  Result Value Ref Range   Color, Urine YELLOW YELLOW   APPearance CLOUDY (A) CLEAR   Specific Gravity, Urine 1.030 1.005 - 1.030   pH 5.0 5.0 - 8.0   Glucose, UA NEGATIVE NEGATIVE mg/dL   Hgb urine dipstick NEGATIVE NEGATIVE   Bilirubin Urine NEGATIVE NEGATIVE   Ketones, ur 20 (A) NEGATIVE mg/dL   Protein, ur 30 (A) NEGATIVE mg/dL   Nitrite NEGATIVE NEGATIVE   Leukocytes,Ua NEGATIVE NEGATIVE   RBC / HPF 0-5 0 - 5 RBC/hpf   WBC, UA 0-5 0 - 5 WBC/hpf   Bacteria, UA RARE (A) NONE SEEN   Squamous Epithelial / HPF 6-10 0 - 5 /HPF   Mucus PRESENT    Amorphous Crystal PRESENT   Comprehensive metabolic panel     Status: Abnormal   Collection Time: 01/02/24  9:55 AM  Result Value Ref Range   Sodium 135 135 - 145 mmol/L   Potassium 4.1 3.5 - 5.1 mmol/L   Chloride 105 98 - 111 mmol/L   CO2 21 (L) 22 - 32 mmol/L   Glucose, Bld  98 70 - 99 mg/dL   BUN 7 6 - 20 mg/dL   Creatinine, Ser 8.29 0.44 - 1.00 mg/dL   Calcium 8.5 (L) 8.9 - 10.3 mg/dL   Total Protein 5.9 (L) 6.5 - 8.1 g/dL   Albumin 2.8 (L) 3.5 - 5.0 g/dL   AST 15 15 - 41 U/L   ALT 12 0 - 44 U/L   Alkaline Phosphatase 65 38 - 126 U/L   Total Bilirubin 0.5 0.0 - 1.2 mg/dL   GFR, Estimated >56 >21 mL/min   Anion gap 9 5 - 15  CBC     Status: Abnormal   Collection Time: 01/02/24  9:55 AM  Result Value Ref Range   WBC 12.8 (H) 4.0 - 10.5 K/uL   RBC 3.95 3.87 - 5.11 MIL/uL   Hemoglobin 12.5 12.0 - 15.0 g/dL   HCT 30.8 65.7 - 84.6 %   MCV 93.4 80.0 - 100.0 fL   MCH 31.6 26.0 - 34.0 pg   MCHC 33.9 30.0 - 36.0 g/dL   RDW 96.2 95.2 - 84.1 %   Platelets 218 150 - 400 K/uL   nRBC 0.0 0.0 - 0.2 %     Imaging Studies ordered: none  Medicines ordered and prescription drug management:  Medications:  Meds ordered this encounter  Medications   ondansetron (ZOFRAN-ODT) disintegrating tablet 8 mg   lactated ringers bolus 1,000 mL   promethazine (PHENERGAN) tablet 25  mg   ondansetron (ZOFRAN-ODT) 4 MG disintegrating tablet    Sig: Take 1 tablet (4 mg total) by mouth every 6 (six) hours as needed for nausea.    Dispense:  20 tablet    Refill:  0   promethazine (PHENERGAN) 25 MG tablet    Sig: Take 1 tablet (25 mg total) by mouth every 6 (six) hours as needed for nausea or vomiting.    Dispense:  30 tablet    Refill:  0    Reevaluation of the patient after these medicines showed that the patient improved I have reviewed the patients home medicines and have made adjustments as needed  Critical Interventions: IV fluids   MAU Course: 11:43 AM improved with 1L and Zofran. Still having some nausea. Last vomiting was > 2 hr ago. Discussed dose of phenergan and then discharge home. Patient is amenable.   After the interventions noted above, I reevaluated the patient and found that they have :improved  Dispostion: discharged   Assessment and Plan   1.  Nausea and vomiting, unspecified vomiting type   2. Diarrhea, unspecified type   3. [redacted] weeks gestation of pregnancy   4. NST (non-stress test) reactive    - Provided Rx for phenergan and zofran - Reviewed low but frequent volumes of fluids - return if unable to keep down fluids - keep prenatal care appointment  Federico Flake 01/02/2024, 12:15 PM

## 2024-01-02 NOTE — Discharge Instructions (Addendum)
You were seen for nausea, vomiting and diarrhea. We treated you with medications and gave you fluid. If this is a virus this will typically last for 24-48 hours. If food related typically the symptoms improved within 6-8 hours. You will continue to feel weak after either way  We recommend you try small frequent amounts of fluid to help prevent dehydration.   Please return to the MAU for any continued symptoms, inability to keep down water/fluids.

## 2024-01-02 NOTE — MAU Note (Signed)
.  RIGBY GROVE is a 28 y.o. at [redacted]w[redacted]d here in MAU reporting: N/V/D since 2am. Has not had an episode since 7:45 but still is nauseated. Reports some abd pain with vomiting. Good fetal movement reported.   LMP:  Onset of complaint: 2am Pain score: 3 Vitals:   01/02/24 0855  BP: 118/66  Pulse: (!) 123  Resp: 18  Temp: 97.7 F (36.5 C)     FHT: 170  Lab orders placed from triage: u/a

## 2024-01-15 ENCOUNTER — Encounter: Payer: Self-pay | Admitting: Physician Assistant

## 2024-01-15 LAB — GONORRHEA SCREEN: Neisseria Gonorrhoeae RNA: NEGATIVE

## 2024-01-26 ENCOUNTER — Inpatient Hospital Stay (HOSPITAL_COMMUNITY)
Admission: AD | Admit: 2024-01-26 | Discharge: 2024-01-26 | Disposition: A | Discharge status: Home / Self Care | Payer: Managed Care, Other (non HMO) | Attending: Obstetrics and Gynecology | Admitting: Obstetrics and Gynecology

## 2024-01-26 ENCOUNTER — Encounter (HOSPITAL_COMMUNITY): Payer: Self-pay | Admitting: Obstetrics and Gynecology

## 2024-01-26 ENCOUNTER — Other Ambulatory Visit: Payer: Self-pay

## 2024-01-26 DIAGNOSIS — R030 Elevated blood-pressure reading, without diagnosis of hypertension: Secondary | ICD-10-CM | POA: Insufficient documentation

## 2024-01-26 DIAGNOSIS — R519 Headache, unspecified: Secondary | ICD-10-CM | POA: Diagnosis not present

## 2024-01-26 DIAGNOSIS — O26892 Other specified pregnancy related conditions, second trimester: Secondary | ICD-10-CM | POA: Diagnosis not present

## 2024-01-26 DIAGNOSIS — Z3A27 27 weeks gestation of pregnancy: Secondary | ICD-10-CM | POA: Diagnosis not present

## 2024-01-26 LAB — COMPREHENSIVE METABOLIC PANEL
ALT: 12 U/L (ref 0–44)
AST: 13 U/L — ABNORMAL LOW (ref 15–41)
Albumin: 2.5 g/dL — ABNORMAL LOW (ref 3.5–5.0)
Alkaline Phosphatase: 61 U/L (ref 38–126)
Anion gap: 10 (ref 5–15)
BUN: 8 mg/dL (ref 6–20)
CO2: 22 mmol/L (ref 22–32)
Calcium: 8.5 mg/dL — ABNORMAL LOW (ref 8.9–10.3)
Chloride: 103 mmol/L (ref 98–111)
Creatinine, Ser: 0.53 mg/dL (ref 0.44–1.00)
GFR, Estimated: >60 mL/min (ref >60)
Glucose, Bld: 84 mg/dL (ref 70–99)
Potassium: 3.8 mmol/L (ref 3.5–5.1)
Sodium: 135 mmol/L (ref 135–145)
Total Bilirubin: 0.3 mg/dL (ref 0.0–1.2)
Total Protein: 5.4 g/dL — ABNORMAL LOW (ref 6.5–8.1)

## 2024-01-26 LAB — CBC
HCT: 32.1 % — ABNORMAL LOW (ref 36.0–46.0)
Hemoglobin: 10.9 g/dL — ABNORMAL LOW (ref 12.0–15.0)
MCH: 31 pg (ref 26.0–34.0)
MCHC: 34 g/dL (ref 30.0–36.0)
MCV: 91.2 fL (ref 80.0–100.0)
Platelets: 209 10*3/uL (ref 150–400)
RBC: 3.52 MIL/uL — ABNORMAL LOW (ref 3.87–5.11)
RDW: 12.7 % (ref 11.5–15.5)
WBC: 7.5 10*3/uL (ref 4.0–10.5)
nRBC: 0 % (ref 0.0–0.2)

## 2024-01-26 LAB — PROTEIN / CREATININE RATIO, URINE
Creatinine, Urine: 185 mg/dL
Protein Creatinine Ratio: 0.12 mg/mg{creat} (ref 0.00–0.15)
Total Protein, Urine: 23 mg/dL

## 2024-01-26 NOTE — MAU Provider Note (Signed)
 Chief Complaint:  Headache and BP Evaluation   HPI   None     Victoria Carson is a 28 y.o. G1P0 at [redacted]w[redacted]d who presents to maternity admissions reporting history of Migraine Headaches at ~ 1630 today patient reported that she experienced an aura r/t to her HA and took Tylenol 1,000 mg PO reports that her HA has resolved after tylenol and rates pain at 3/10 on pain scale. She also reported that she took her BP at home with a Wrist BP cuff  and her BP's she reported were  " 187/101 and 206/101". Patient reports that she waited 30 minutes after she had the onset of the severe HA before she took her BP. She denies having any visual changes, RUQ pain, and reports that she has no H/O hypertension.   Pregnancy Course: [facility]  Past Medical History:  Diagnosis Date   Chronic cholecystitis 07/2016   Cyst of right Bartholin's gland 05/2020   Dental crown present    Difficulty swallowing pills    Dust allergy    Eczema    GERD (gastroesophageal reflux disease)    no current med.   Hypothyroidism (acquired)    IBS (irritable bowel syndrome)    Colonoscopy 09/04/2019 x 2 precanerous polyps removed, repeat 3-5 yrs   Interstitial cystitis 2019   Migraines    OB History  Gravida Para Term Preterm AB Living  1       SAB IAB Ectopic Multiple Live Births          # Outcome Date GA Lbr Len/2nd Weight Sex Type Anes PTL Lv  1 Current            Past Surgical History:  Procedure Laterality Date   CHOLECYSTECTOMY N/A 08/08/2016   Procedure: LAPAROSCOPIC CHOLECYSTECTOMY;  Surgeon: Sherrelle Miyamoto, MD;  Location: [facility];  Service: General;  Laterality: N/A;   WISDOM TOOTH EXTRACTION     Family History  Problem Relation Age of Onset   Mental illness Mother    Drug abuse Mother    Healthy Father    Healthy Sister    Healthy Brother    Heart failure Other    Hypertension Other    Depression Paternal Aunt    Anxiety disorder Paternal Aunt    Stroke Paternal  Grandfather    Liver disease Maternal Grandmother    Colon polyps Neg Hx    Esophageal cancer Neg Hx    Stomach cancer Neg Hx    Rectal cancer Neg Hx    Allergic rhinitis Neg Hx    Angioedema Neg Hx    Asthma Neg Hx    Atopy Neg Hx    Eczema Neg Hx    Immunodeficiency Neg Hx    Urticaria Neg Hx    Social History   Tobacco Use   Smoking status: Never   Smokeless tobacco: Never  Vaping Use   Vaping status: Never Used  Substance Use Topics   Alcohol use: Not Currently    Comment: occassional   Drug use: No   Allergies  Allergen Reactions   Citrus Itching and Other (See Comments)    SWEATING   Sulfa Antibiotics Nausea Only, Other (See Comments) and Nausea And Vomiting    FEVER   Medications Prior to Admission  Medication Sig Dispense Refill Last Dose/Taking   EPINEPHrine 0.3 mg/0.3 mL IJ SOAJ injection Inject 0.3 mg into the muscle as needed for anaphylaxis. 2 each 1    ondansetron (ZOFRAN-ODT)  4 MG disintegrating tablet Take 1 tablet (4 mg total) by mouth every 6 (six) hours as needed for nausea. 20 tablet 0    Prenatal Vit-Fe Fumarate-FA (PRENATAL MULTIVITAMIN) TABS tablet Take 1 tablet by mouth daily at 12 noon.   01/24/2024   promethazine (PHENERGAN) 25 MG tablet Take 1 tablet (25 mg total) by mouth every 6 (six) hours as needed for nausea or vomiting. 30 tablet 0    pyridOXINE (VITAMIN B6) 25 MG tablet Take 25 mg by mouth 3 (three) times daily before meals.       I have reviewed patient's Past Medical Hx, Surgical Hx, Family Hx, Social Hx, medications and allergies.   ROS  Pertinent items noted in HPI and remainder of comprehensive ROS otherwise negative.   PHYSICAL EXAM  Patient Vitals for the past 24 hrs:  BP Temp Temp src Pulse Resp SpO2  01/26/24 2045 107/61 -- -- 69 -- 99 %  01/26/24 2030 101/64 -- -- 78 -- 98 %  01/26/24 2015 110/67 -- -- 80 -- 98 %  01/26/24 2010 -- -- -- -- -- 99 %  01/26/24 2005 -- -- -- -- -- 99 %  01/26/24 2000 106/69 -- -- 72 -- 99 %   01/26/24 1945 102/73 -- -- 80 -- 98 %  01/26/24 1940 -- -- -- -- -- 98 %  01/26/24 1935 -- -- -- -- -- 97 %  01/26/24 1934 -- -- -- -- -- 97 %  01/26/24 1930 106/70 -- -- 72 -- 98 %  01/26/24 1925 -- -- -- -- -- 97 %  01/26/24 1920 -- -- -- -- -- 98 %  01/26/24 1915 108/63 -- -- 80 -- 97 %  01/26/24 1910 -- -- -- -- -- 97 %  01/26/24 1905 -- -- -- -- -- 98 %  01/26/24 1900 107/63 -- -- 82 -- 97 %  01/26/24 1855 114/61 98.4 F (36.9 C) Oral 90 18 --    Constitutional: Well-developed, well-nourished female in no acute distress.  Cardiovascular: normal rate & rhythm, warm and well-perfused Respiratory: normal effort, no problems with respiration noted GI: Abd soft, non-tender, gravid MS: Extremities nontender, no edema, normal ROM Neurologic: Alert and oriented x 4.  GU: no CVA tenderness Pelvic: Deferred      Fetal Tracing: @ 2048 Cat 1 reactive for GA Baseline: 155 Variability:Moderate  Accelerations: Present Decelerations:Absent Toco: No CTX   Labs: Results for orders placed or performed during the hospital encounter of 01/26/24 (from the past 24 hours)  Protein / creatinine ratio, urine     Status: None   Collection Time: 01/26/24  7:37 PM  Result Value Ref Range   Creatinine, Urine 185 mg/dL   Total Protein, Urine 23 mg/dL   Protein Creatinine Ratio 0.12 0.00 - 0.15 mg/mg[Cre]  CBC     Status: Abnormal   Collection Time: 01/26/24  8:41 PM  Result Value Ref Range   WBC 7.5 4.0 - 10.5 K/uL   RBC 3.52 (L) 3.87 - 5.11 MIL/uL   Hemoglobin 10.9 (L) 12.0 - 15.0 g/dL   HCT 40.9 (L) 81.1 - 91.4 %   MCV 91.2 80.0 - 100.0 fL   MCH 31.0 26.0 - 34.0 pg   MCHC 34.0 30.0 - 36.0 g/dL   RDW 78.2 95.6 - 21.3 %   Platelets 209 150 - 400 K/uL   nRBC 0.0 0.0 - 0.2 %  Comprehensive metabolic panel     Status: Abnormal   Collection Time: 01/26/24  8:41  PM  Result Value Ref Range   Sodium 135 135 - 145 mmol/L   Potassium 3.8 3.5 - 5.1 mmol/L   Chloride 103 98 - 111 mmol/L   CO2  22 22 - 32 mmol/L   Glucose, Bld 84 70 - 99 mg/dL   BUN 8 6 - 20 mg/dL   Creatinine, Ser 1.61 0.44 - 1.00 mg/dL   Calcium 8.5 (L) 8.9 - 10.3 mg/dL   Total Protein 5.4 (L) 6.5 - 8.1 g/dL   Albumin 2.5 (L) 3.5 - 5.0 g/dL   AST 13 (L) 15 - 41 U/L   ALT 12 0 - 44 U/L   Alkaline Phosphatase 61 38 - 126 U/L   Total Bilirubin 0.3 0.0 - 1.2 mg/dL   GFR, Estimated >09 >60 mL/min   Anion gap 10 5 - 15    Imaging:  No results found.  MDM & MAU COURSE  MDM:  HIGH  HELLP Labs HA resolved  BP's normotensive   I have reviewed the patient chart and performed the physical exam . I have ordered & interpreted the lab results and reviewed and interpreted the results Medications ordered as stated below.  A/P as described below.  Counseling and education provided and patient agreeable  with plan as described below. Verbalized understanding.    Vitals:   01/26/24 1855 01/26/24 1900 01/26/24 1915 01/26/24 1930  BP: 114/61 107/63 108/63 106/70   01/26/24 1945 01/26/24 2000 01/26/24 2015 01/26/24 2030  BP: 102/73 106/69 110/67 101/64   01/26/24 2045  BP: 107/61     MAU Course: Orders Placed This Encounter  Procedures   CBC   Comprehensive metabolic panel   Protein / creatinine ratio, urine   Discharge patient Discharge disposition: 01-Home or Self Care; Discharge patient date: 01/26/2024   No orders of the defined types were placed in this encounter.   ASSESSMENT   1. Nonintractable episodic headache, unspecified headache type   2. [redacted] weeks gestation of pregnancy   3. Elevated BP without diagnosis of hypertension     PLAN  Discharge home in stable condition with return precautions.   OB F/U as scheduled   Allergies as of 01/26/2024       Reactions   Citrus Itching, Other (See Comments)   SWEATING   Sulfa Antibiotics Nausea Only, Other (See Comments), Nausea And Vomiting   FEVER        Medication List     TAKE these medications    EPINEPHrine 0.3 mg/0.3 mL Soaj  injection Commonly known as: EPI-PEN Inject 0.3 mg into the muscle as needed for anaphylaxis.   ondansetron 4 MG disintegrating tablet Commonly known as: ZOFRAN-ODT Take 1 tablet (4 mg total) by mouth every 6 (six) hours as needed for nausea.   prenatal multivitamin Tabs tablet Take 1 tablet by mouth daily at 12 noon.   promethazine 25 MG tablet Commonly known as: PHENERGAN Take 1 tablet (25 mg total) by mouth every 6 (six) hours as needed for nausea or vomiting.   pyridOXINE 25 MG tablet Commonly known as: VITAMIN B6 Take 25 mg by mouth 3 (three) times daily before meals.        Marcell Barlow, MSN, [facility], [facility] [employer]

## 2024-01-26 NOTE — MAU Note (Signed)
Victoria Carson is a 28 y.o. at [redacted]w[redacted]d here in MAU reporting: she has a history of migraines and has had three aura's today.  States took BP @ home and BP 187/101 and 30 minutes later was 206/101.  States she is now getting a HA and having "fuzzy vision", denies epigastric pain.  Endorses +FM, denies VB or LOF.  LMP: NA Onset of complaint: today Pain score: 3 Vitals:   01/26/24 1855  BP: 114/61  Pulse: 90  Resp: 18  Temp: 98.4 F (36.9 C)     FHT: see notes  Lab orders placed from triage: UA

## 2024-01-26 NOTE — Discharge Instructions (Signed)
Follow-up with your [facility] provider as scheduled.

## 2024-02-06 LAB — CBC AND DIFFERENTIAL
HCT: 34 — AB (ref 36–46)
HCT: 34 — AB (ref 36–46)
Hemoglobin: 11.3 — AB (ref 12.0–16.0)
Platelets: 230 10*3/uL (ref 150–400)

## 2024-02-21 ENCOUNTER — Encounter: Payer: Self-pay | Admitting: Physician Assistant

## 2024-03-05 ENCOUNTER — Encounter: Payer: Self-pay | Admitting: Physician Assistant

## 2024-04-03 LAB — OB RESULTS CONSOLE GBS: GBS: NEGATIVE

## 2024-04-08 ENCOUNTER — Encounter: Payer: Self-pay | Admitting: Physician Assistant

## 2024-04-23 ENCOUNTER — Encounter (HOSPITAL_COMMUNITY): Payer: Self-pay | Admitting: Obstetrics and Gynecology

## 2024-04-23 ENCOUNTER — Inpatient Hospital Stay (HOSPITAL_COMMUNITY)
Admission: AD | Admit: 2024-04-23 | Discharge: 2024-04-23 | Disposition: A | Discharge status: Home / Self Care | Source: Home / Self Care | Attending: Obstetrics | Admitting: Obstetrics

## 2024-04-23 DIAGNOSIS — O4693 Antepartum hemorrhage, unspecified, third trimester: Secondary | ICD-10-CM

## 2024-04-23 DIAGNOSIS — Z3A39 39 weeks gestation of pregnancy: Secondary | ICD-10-CM

## 2024-04-23 NOTE — MAU Note (Signed)
 Victoria Carson is a 28 y.o. at [redacted]w[redacted]d here in MAU reporting: Pt states she had a membrane sweep today at 1400. Pt states around 1800 she noticed a lot of bleeding that bled through her pants. Pt reports light to moderate bleeding and states the bleeding has continued til now. Pt reports seeing small clots when she wiped. Pt reports about 2-3 ctx every hour. Pt denies LOF. +FM   SVE today 1.5cm   Onset of complaint: 1800 Pain score: 3/10 Vitals:   04/23/24 2119  BP: 126/79  Pulse: (!) 101  Resp: 18  Temp: 98.5 F (36.9 C)  SpO2: 97%     FHT:152 Lab orders placed from triage:

## 2024-04-23 NOTE — MAU Provider Note (Signed)
 Chief Complaint:  Vaginal Bleeding   Event Date/Time   First Provider Initiated Contact with Patient 04/23/24 2156     HPI: Victoria Carson is a 28 y.o. G1P0 at 67w5dwho presents to maternity admissions reporting vaginal bleeding after membrane sweeping today. States it soaked through clothes but has diminished now.  Occasional contractions.. She reports good fetal movement, denies LOF, n/v, or fever/chills.    Vaginal Bleeding The patient's primary symptoms include vaginal bleeding. The patient's pertinent negatives include no pelvic pain. This is a new problem. The current episode started today.   RN Note:  Victoria Carson is a 28 y.o. at [redacted]w[redacted]d here in MAU reporting: Pt states she had a membrane sweep today at 1400. Pt states around 1800 she noticed a lot of bleeding that bled through her pants. Pt reports light to moderate bleeding and states the bleeding has continued til now. Pt reports seeing small clots when she wiped. Pt reports about 2-3 ctx every hour. Pt denies LOF. +FM     Past Medical History: Past Medical History:  Diagnosis Date   Chronic cholecystitis 07/2016   Cyst of right Bartholin's gland 05/2020   Dental crown present    Difficulty swallowing pills    Dust allergy    Eczema    GERD (gastroesophageal reflux disease)    no current med.   Hypothyroidism (acquired)    IBS (irritable bowel syndrome)    Colonoscopy 09/04/2019 x 2 precanerous polyps removed, repeat 3-5 yrs   Interstitial cystitis 2019   Migraines     Past obstetric history: OB History  Gravida Para Term Preterm AB Living  1       SAB IAB Ectopic Multiple Live Births          # Outcome Date GA Lbr Len/2nd Weight Sex Type Anes PTL Lv  1 Current             Past Surgical History: Past Surgical History:  Procedure Laterality Date   CHOLECYSTECTOMY N/A 08/08/2016   Procedure: LAPAROSCOPIC CHOLECYSTECTOMY;  Surgeon: Oza Blumenthal, MD;  Location: [facility];  Service: General;   Laterality: N/A;   WISDOM TOOTH EXTRACTION      Family History: Family History  Problem Relation Age of Onset   Mental illness Mother    Drug abuse Mother    Healthy Father    Healthy Sister    Healthy Brother    Heart failure Other    Hypertension Other    Depression Paternal Aunt    Anxiety disorder Paternal Aunt    Stroke Paternal Grandfather    Liver disease Maternal Grandmother    Colon polyps Neg Hx    Esophageal cancer Neg Hx    Stomach cancer Neg Hx    Rectal cancer Neg Hx    Allergic rhinitis Neg Hx    Angioedema Neg Hx    Asthma Neg Hx    Atopy Neg Hx    Eczema Neg Hx    Immunodeficiency Neg Hx    Urticaria Neg Hx     Social History: Social History   Tobacco Use   Smoking status: Never   Smokeless tobacco: Never  Vaping Use   Vaping status: Never Used  Substance Use Topics   Alcohol use: Not Currently    Comment: occassional   Drug use: No    Allergies:  Allergies  Allergen Reactions   Citrus Itching and Other (See Comments)    SWEATING   Sulfa  Antibiotics Nausea Only,  Other (See Comments) and Nausea And Vomiting    FEVER    Meds:  Medications Prior to Admission  Medication Sig Dispense Refill Last Dose/Taking   Cholecalciferol (VITAMIN D3) 25 MCG CAPS Take by mouth.   04/22/2024   Prenatal Vit-Fe Fumarate-FA (PRENATAL MULTIVITAMIN) TABS tablet Take 1 tablet by mouth daily at 12 noon.   04/23/2024   EPINEPHrine  0.3 mg/0.3 mL IJ SOAJ injection Inject 0.3 mg into the muscle as needed for anaphylaxis. 2 each 1    ondansetron  (ZOFRAN -ODT) 4 MG disintegrating tablet Take 1 tablet (4 mg total) by mouth every 6 (six) hours as needed for nausea. 20 tablet 0    promethazine  (PHENERGAN ) 25 MG tablet Take 1 tablet (25 mg total) by mouth every 6 (six) hours as needed for nausea or vomiting. 30 tablet 0    pyridOXINE (VITAMIN B6) 25 MG tablet Take 25 mg by mouth 3 (three) times daily before meals.       I have reviewed patient's Past Medical Hx, Surgical  Hx, Family Hx, Social Hx, medications and allergies.   ROS:  Review of Systems  Genitourinary:  Positive for vaginal bleeding. Negative for pelvic pain.   Other systems negative  Physical Exam  Patient Vitals for the past 24 hrs:  BP Temp Temp src Pulse Resp SpO2 Height Weight  04/23/24 2149 117/68 -- -- 81 -- -- -- --  04/23/24 2145 -- -- -- -- -- 97 % -- --  04/23/24 2139 -- -- -- -- -- 98 % -- --  04/23/24 2129 134/78 -- -- 85 -- -- -- --  04/23/24 2119 126/79 98.5 F (36.9 C) Oral (!) 101 18 97 % -- --  04/23/24 2116 -- -- -- -- -- -- 5\' 9"  (1.753 m) 86.8 kg   Constitutional: Well-developed, well-nourished female in no acute distress.  Cardiovascular: normal rate  Respiratory: normal effort GI: Abd soft, non-tender,  No rebound or guarding. MS: Extremities nontender, no edema, normal ROM Neurologic: Alert and oriented x 4.  GU: Neg CVAT.  PELVIC EXAM: small to moderate amount of dark red blood.  No active bleeding  Dilation: 1.5 Effacement (%): 50 Cervical Position: Middle Exam by:: Eustacio Highman, RN  FHT:  Baseline 140 , moderate variability, accelerations present, no decelerations Contractions:  Irregular     Labs: No results found for this or any previous visit (from the past 24 hours).    Imaging:  No results found.  MAU Course/MDM: I have reviewed the triage vital signs and the nursing notes.   Pertinent labs & imaging results that were available during my care of the patient were reviewed by me and considered in my medical decision making (see chart for details).      I have reviewed her medical records including past results, notes and treatments.   NST reviewed  Treatments in MAU included EFM, Speculum exam.    Assessment: Single IUP at [redacted]w[redacted]d Vaginal bleeding, likely resulting from membrane sweep Reactinve nonstress test  Plan: Discharge home Bleeding precautions Labor precautions and fetal kick counts Follow up in Office for prenatal visits  and recheck Encouraged to return if she develops worsening of symptoms, increase in pain, fever, or other concerning symptoms.   Pt stable at time of discharge.  Holmes Lusher CNM, MSN Certified Nurse-Midwife 04/23/2024 9:56 PM

## 2024-04-25 ENCOUNTER — Encounter (HOSPITAL_COMMUNITY): Payer: Self-pay | Admitting: Student

## 2024-04-25 ENCOUNTER — Inpatient Hospital Stay (HOSPITAL_COMMUNITY)
Admission: AD | Admit: 2024-04-25 | Discharge: 2024-04-28 | DRG: 807 | Disposition: A | Discharge status: Home / Self Care | Attending: Student | Admitting: Student

## 2024-04-25 DIAGNOSIS — O4290 Premature rupture of membranes, unspecified as to length of time between rupture and onset of labor, unspecified weeks of gestation: Principal | ICD-10-CM | POA: Diagnosis present

## 2024-04-25 DIAGNOSIS — O9962 Diseases of the digestive system complicating childbirth: Secondary | ICD-10-CM | POA: Diagnosis present

## 2024-04-25 DIAGNOSIS — K219 Gastro-esophageal reflux disease without esophagitis: Secondary | ICD-10-CM | POA: Diagnosis present

## 2024-04-25 DIAGNOSIS — O4292 Full-term premature rupture of membranes, unspecified as to length of time between rupture and onset of labor: Principal | ICD-10-CM | POA: Diagnosis present

## 2024-04-25 DIAGNOSIS — Z3A40 40 weeks gestation of pregnancy: Secondary | ICD-10-CM

## 2024-04-25 DIAGNOSIS — Z8249 Family history of ischemic heart disease and other diseases of the circulatory system: Secondary | ICD-10-CM

## 2024-04-25 LAB — CBC
HCT: 32.2 % — ABNORMAL LOW (ref 36.0–46.0)
Hemoglobin: 10.3 g/dL — ABNORMAL LOW (ref 12.0–15.0)
MCH: 27.8 pg (ref 26.0–34.0)
MCHC: 32 g/dL (ref 30.0–36.0)
MCV: 86.8 fL (ref 80.0–100.0)
Platelets: 195 10*3/uL (ref 150–400)
RBC: 3.71 MIL/uL — ABNORMAL LOW (ref 3.87–5.11)
RDW: 13.5 % (ref 11.5–15.5)
WBC: 6.8 10*3/uL (ref 4.0–10.5)
nRBC: 0 % (ref 0.0–0.2)

## 2024-04-25 LAB — POCT FERN TEST: POCT Fern Test: POSITIVE

## 2024-04-25 LAB — TYPE AND SCREEN
ABO/RH(D): O POS
Antibody Screen: NEGATIVE

## 2024-04-25 MED ORDER — SODIUM CHLORIDE 0.9% FLUSH
3.0000 mL | Freq: Two times a day (BID) | INTRAVENOUS | Status: DC
Start: 2024-04-25 — End: 2024-04-26

## 2024-04-25 MED ORDER — MISOPROSTOL 50MCG HALF TABLET: 50.0000 ug | ORAL_TABLET | ORAL | Status: DC | PRN

## 2024-04-25 MED ORDER — SODIUM CHLORIDE 0.9 % IV SOLN: 250.0000 mL | INTRAVENOUS | Status: DC | PRN

## 2024-04-25 MED ORDER — MISOPROSTOL 50MCG HALF TABLET
50.0000 ug | ORAL_TABLET | ORAL | Status: DC | PRN
  Administered 2024-04-25 – 2024-04-26 (×2): 50 ug via ORAL
  Filled 2024-04-25 (×2): qty 1

## 2024-04-25 MED ORDER — SODIUM CHLORIDE 0.9% FLUSH: 3.0000 mL | INTRAVENOUS | Status: DC | PRN

## 2024-04-25 MED ORDER — TERBUTALINE SULFATE 1 MG/ML IJ SOLN: 0.2500 mg | Freq: Once | INTRAMUSCULAR | Status: DC | PRN

## 2024-04-25 MED ORDER — OXYTOCIN-SODIUM CHLORIDE 30-0.9 UT/500ML-% IV SOLN: 1.0000 m[IU]/min | INTRAVENOUS | Status: DC

## 2024-04-25 NOTE — MAU Note (Signed)
 Victoria Carson is a 28 y.o. at [redacted]w[redacted]d here in MAU reporting about 2000 she was sitting on the sofa and suddenly was wet thru clothes and on sofa. Has not leaked any since then. Has had episodes of leaking urine. Reports good FM and some streaks of blood in mucous plug. Was 1.5cm Tues. Baby is moving like usual but does not feel as "cushioned" . Occ ctxs but irreg and has now stopped  LMP: n/a Onset of complaint: 2000 Pain score: 0 Vitals:   04/25/24 2027 04/25/24 2029  BP:  133/79  Pulse: (!) 101   Resp: 17   Temp: 98.2 F (36.8 C)   SpO2: 99%      FHT: 132  Lab orders placed from triage: labor eval

## 2024-04-25 NOTE — H&P (Signed)
 Victoria Carson is a 28 y.o. female G1P0 @ 28.0 presenting for rupture of membranes.  +Leakage of large clear fluid x 1 at 8 pm, fern positive in MAU.   Pregnancy is complicated by:  - Hashimoto's thyroiditis: no meds, last TSH 2.4 (4/10)  - EIF on anatomy US : NIPT low risk  Denies regular contractions. +FM She was scheduled for elective IOL 5/16.  OB History     Gravida  1   Para      Term      Preterm      AB      Living         SAB      IAB      Ectopic      Multiple      Live Births             Past Medical History:  Diagnosis Date   Chronic cholecystitis 07/2016   Cyst of right Bartholin's gland 05/2020   Dental crown present    Difficulty swallowing pills    Dust allergy    Eczema    GERD (gastroesophageal reflux disease)    no current med.   Hypothyroidism (acquired)    IBS (irritable bowel syndrome)    Colonoscopy 09/04/2019 x 2 precanerous polyps removed, repeat 3-5 yrs   Interstitial cystitis 2019   Migraines    Past Surgical History:  Procedure Laterality Date   CHOLECYSTECTOMY N/A 08/08/2016   Procedure: LAPAROSCOPIC CHOLECYSTECTOMY;  Surgeon: Oza Blumenthal, MD;  Location: [facility];  Service: General;  Laterality: N/A;   WISDOM TOOTH EXTRACTION     Family History: family history includes Anxiety disorder in her paternal aunt; Depression in her paternal aunt; Drug abuse in her mother; Healthy in her brother, father, and sister; Heart failure in an other family member; Hypertension in an other family member; Liver disease in her maternal grandmother; Mental illness in her mother; Stroke in her paternal grandfather. Social History:  reports that she has never smoked. She has never used smokeless tobacco. She reports that she does not currently use alcohol. She reports that she does not use drugs.     Maternal Diabetes: No Genetic Screening: Normal Maternal Ultrasounds/Referrals: Isolated EIF (echogenic intracardiac  focus) Fetal Ultrasounds or other Referrals:  None Maternal Substance Abuse:  No Significant Maternal Medications:  None Significant Maternal Lab Results:  Group B Strep negative Number of Prenatal Visits:greater than 3 verified prenatal visits Maternal Vaccinations:TDap Other Comments:  None  Review of Systems  Constitutional:  Negative for chills and fever.  Respiratory:  Negative for shortness of breath.   Cardiovascular:  Negative for chest pain.  Gastrointestinal:  Negative for abdominal pain.  Genitourinary:  Positive for pelvic pain and vaginal discharge. Negative for vaginal bleeding.  Neurological:  Negative for light-headedness and headaches.   History Dilation: 1.5 Effacement (%): 50 Station: -3 Exam by:: Eustacio Highman, RN Blood pressure 130/74, pulse 93, temperature 98.2 F (36.8 C), resp. rate 17, height 5\' 9"  (1.753 m), weight 85.7 kg, last menstrual period 07/20/2023, SpO2 99%. Exam Physical Exam Constitutional:      General: She is not in acute distress.    Appearance: Normal appearance.  HENT:     Head: Normocephalic and atraumatic.  Pulmonary:     Effort: Pulmonary effort is normal.  Musculoskeletal:        General: Normal range of motion.  Neurological:     Mental Status: She is alert.  Psychiatric:  Mood and Affect: Mood normal.        Behavior: Behavior normal.     Prenatal labs: ABO, Rh: --/--/PENDING (05/15 2140) Antibody: PENDING (05/15 2140) Rubella:  Immune RPR:   NR HBsAg: Negative (10/24 0000)  HIV: Non-Reactive (10/24 0000)  GBS: Negative/-- (04/23 0000)   Assessment/Plan: 28 yo G1P0 @ 40.0 with PROM  - IOL: ripening with cytotec - Planning epidural, ok when requested - GBS negative - Labor diet, saline lock while ripening  Leanne Pronto 04/25/2024, 10:26 PM

## 2024-04-25 NOTE — MAU Note (Signed)
 Pt informed that the ultrasound is considered a limited OB ultrasound and is not intended to be a complete ultrasound exam.  Patient also informed that the ultrasound is not being completed with the intent of assessing for fetal or placental anomalies or any pelvic abnormalities.  Explained that the purpose of today's ultrasound is to assess for  presentation.  Patient acknowledges the purpose of the exam and the limitations of the study.     Vertex verified by certified RN

## 2024-04-26 ENCOUNTER — Inpatient Hospital Stay (HOSPITAL_COMMUNITY): Admitting: Anesthesiology

## 2024-04-26 ENCOUNTER — Encounter (HOSPITAL_COMMUNITY): Payer: Self-pay | Admitting: Student

## 2024-04-26 DIAGNOSIS — K219 Gastro-esophageal reflux disease without esophagitis: Secondary | ICD-10-CM | POA: Diagnosis present

## 2024-04-26 DIAGNOSIS — Z3A40 40 weeks gestation of pregnancy: Secondary | ICD-10-CM | POA: Diagnosis not present

## 2024-04-26 DIAGNOSIS — Z8249 Family history of ischemic heart disease and other diseases of the circulatory system: Secondary | ICD-10-CM | POA: Diagnosis not present

## 2024-04-26 DIAGNOSIS — O4290 Premature rupture of membranes, unspecified as to length of time between rupture and onset of labor, unspecified weeks of gestation: Principal | ICD-10-CM | POA: Diagnosis present

## 2024-04-26 DIAGNOSIS — O4292 Full-term premature rupture of membranes, unspecified as to length of time between rupture and onset of labor: Secondary | ICD-10-CM | POA: Diagnosis present

## 2024-04-26 DIAGNOSIS — O9962 Diseases of the digestive system complicating childbirth: Secondary | ICD-10-CM | POA: Diagnosis present

## 2024-04-26 LAB — RPR: RPR Ser Ql: NONREACTIVE

## 2024-04-26 MED ORDER — LACTATED RINGERS IV SOLN: INTRAVENOUS | Status: DC

## 2024-04-26 MED ORDER — ONDANSETRON HCL 4 MG PO TABS: 4.0000 mg | ORAL_TABLET | ORAL | Status: DC | PRN

## 2024-04-26 MED ORDER — LACTATED RINGERS IV SOLN: 500.0000 mL | INTRAVENOUS | Status: DC | PRN

## 2024-04-26 MED ORDER — OXYTOCIN-SODIUM CHLORIDE 30-0.9 UT/500ML-% IV SOLN
2.5000 [IU]/h | INTRAVENOUS | Status: DC
  Administered 2024-04-26: 2.5 [IU]/h via INTRAVENOUS

## 2024-04-26 MED ORDER — PRENATAL MULTIVITAMIN CH
1.0000 | ORAL_TABLET | Freq: Every day | ORAL | Status: DC
  Administered 2024-04-27: 1 via ORAL
  Filled 2024-04-26: qty 1

## 2024-04-26 MED ORDER — ACETAMINOPHEN 325 MG PO TABS: 650.0000 mg | ORAL_TABLET | ORAL | Status: DC | PRN

## 2024-04-26 MED ORDER — TETANUS-DIPHTH-ACELL PERTUSSIS 5-2.5-18.5 LF-MCG/0.5 IM SUSY
0.5000 mL | PREFILLED_SYRINGE | Freq: Once | INTRAMUSCULAR | Status: DC
  Filled 2024-04-26: qty 0.5

## 2024-04-26 MED ORDER — DIBUCAINE (PERIANAL) 1 % EX OINT: 1.0000 | TOPICAL_OINTMENT | CUTANEOUS | Status: DC | PRN

## 2024-04-26 MED ORDER — WITCH HAZEL-GLYCERIN EX PADS: 1.0000 | MEDICATED_PAD | CUTANEOUS | Status: DC | PRN

## 2024-04-26 MED ORDER — LACTATED RINGERS IV SOLN: 500.0000 mL | Freq: Once | INTRAVENOUS | Status: DC

## 2024-04-26 MED ORDER — FENTANYL-BUPIVACAINE-NACL 0.5-0.125-0.9 MG/250ML-% EP SOLN
12.0000 mL/h | EPIDURAL | Status: DC | PRN
  Administered 2024-04-26: 12 mL/h via EPIDURAL
  Filled 2024-04-26: qty 250

## 2024-04-26 MED ORDER — SIMETHICONE 80 MG PO CHEW: 80.0000 mg | CHEWABLE_TABLET | ORAL | Status: DC | PRN

## 2024-04-26 MED ORDER — OXYTOCIN-SODIUM CHLORIDE 30-0.9 UT/500ML-% IV SOLN
1.0000 m[IU]/min | INTRAVENOUS | Status: DC
  Administered 2024-04-26: 4 m[IU]/min via INTRAVENOUS
  Administered 2024-04-26: 2 m[IU]/min via INTRAVENOUS
  Filled 2024-04-26: qty 500

## 2024-04-26 MED ORDER — FENTANYL CITRATE (PF) 100 MCG/2ML IJ SOLN
50.0000 ug | INTRAMUSCULAR | Status: AC | PRN
  Administered 2024-04-26: 100 ug via INTRAVENOUS
  Administered 2024-04-26: 50 ug via INTRAVENOUS
  Administered 2024-04-26: 100 ug via INTRAVENOUS
  Filled 2024-04-26 (×3): qty 2

## 2024-04-26 MED ORDER — DIPHENHYDRAMINE HCL 50 MG/ML IJ SOLN: 12.5000 mg | INTRAMUSCULAR | Status: DC | PRN

## 2024-04-26 MED ORDER — OXYCODONE-ACETAMINOPHEN 5-325 MG PO TABS: 2.0000 | ORAL_TABLET | ORAL | Status: DC | PRN

## 2024-04-26 MED ORDER — PHENYLEPHRINE 80 MCG/ML (10ML) SYRINGE FOR IV PUSH (FOR BLOOD PRESSURE SUPPORT): 80.0000 ug | PREFILLED_SYRINGE | INTRAVENOUS | Status: DC | PRN

## 2024-04-26 MED ORDER — EPHEDRINE 5 MG/ML INJ: 10.0000 mg | INTRAVENOUS | Status: DC | PRN

## 2024-04-26 MED ORDER — LIDOCAINE HCL (PF) 1 % IJ SOLN
INTRAMUSCULAR | Status: DC | PRN
  Administered 2024-04-26 (×2): 4 mL via EPIDURAL

## 2024-04-26 MED ORDER — COCONUT OIL OIL: 1.0000 | TOPICAL_OIL | Status: DC | PRN

## 2024-04-26 MED ORDER — IBUPROFEN 600 MG PO TABS
600.0000 mg | ORAL_TABLET | Freq: Four times a day (QID) | ORAL | Status: DC
  Administered 2024-04-26: 600 mg via ORAL
  Filled 2024-04-26 (×2): qty 1

## 2024-04-26 MED ORDER — ONDANSETRON HCL 4 MG/2ML IJ SOLN
4.0000 mg | Freq: Four times a day (QID) | INTRAMUSCULAR | Status: DC | PRN
  Administered 2024-04-26: 4 mg via INTRAVENOUS
  Filled 2024-04-26: qty 2

## 2024-04-26 MED ORDER — SENNOSIDES-DOCUSATE SODIUM 8.6-50 MG PO TABS
2.0000 | ORAL_TABLET | Freq: Every day | ORAL | Status: DC
  Administered 2024-04-27: 2 via ORAL
  Filled 2024-04-26: qty 2

## 2024-04-26 MED ORDER — OXYTOCIN BOLUS FROM INFUSION
333.0000 mL | Freq: Once | INTRAVENOUS | Status: AC
  Administered 2024-04-26: 333 mL via INTRAVENOUS

## 2024-04-26 MED ORDER — SOD CITRATE-CITRIC ACID 500-334 MG/5ML PO SOLN: 30.0000 mL | ORAL | Status: DC | PRN

## 2024-04-26 MED ORDER — DIPHENHYDRAMINE HCL 25 MG PO CAPS: 25.0000 mg | ORAL_CAPSULE | Freq: Four times a day (QID) | ORAL | Status: DC | PRN

## 2024-04-26 MED ORDER — ZOLPIDEM TARTRATE 5 MG PO TABS: 5.0000 mg | ORAL_TABLET | Freq: Every evening | ORAL | Status: DC | PRN

## 2024-04-26 MED ORDER — OXYCODONE-ACETAMINOPHEN 5-325 MG PO TABS: 1.0000 | ORAL_TABLET | ORAL | Status: DC | PRN

## 2024-04-26 MED ORDER — LIDOCAINE HCL (PF) 1 % IJ SOLN: 30.0000 mL | INTRAMUSCULAR | Status: DC | PRN

## 2024-04-26 MED ORDER — ONDANSETRON HCL 4 MG/2ML IJ SOLN: 4.0000 mg | INTRAMUSCULAR | Status: DC | PRN

## 2024-04-26 MED ORDER — BENZOCAINE-MENTHOL 20-0.5 % EX AERO
1.0000 | INHALATION_SPRAY | CUTANEOUS | Status: DC | PRN
  Administered 2024-04-26: 1 via TOPICAL
  Filled 2024-04-26: qty 56

## 2024-04-26 NOTE — Progress Notes (Signed)
 Post Partum Day 0 Subjective: Sitting up in bed pumping. Has been out of bed once with assistance and voided once. Reports tenderness in her back at the site of her epidural placement, otherwise denies any pain. No headache. Has not had much to eat since delivery.  Objective: Blood pressure 117/78, pulse 74, temperature 98.1 F (36.7 C), resp. rate 18, height 5\' 9"  (1.753 m), weight 85.7 kg, last menstrual period 07/20/2023, SpO2 99%, unknown if currently breastfeeding.  Physical Exam:  General: alert, cooperative, and no distress   Recent Labs    04/25/24 2140  HGB 10.3*  HCT 32.2*    Assessment/Plan: Doing well in the immediate postpartum period. Continue routine postpartum care.   LOS: 0 days   Alene Ana, MD 04/26/2024, 6:01 PM

## 2024-04-26 NOTE — Lactation Note (Signed)
 This note was copied from a baby's chart. Lactation Consultation Note  Patient Name: Victoria Carson Today's Date: 04/26/2024 Age:28 years Reason for consult: Initial assessment;1st time breastfeeding;Term;Exclusive pumping and bottle feeding. See MOB MR: hx of Hashimoto's thyroidism  MOB informed LC her feeding choice is exclusively pumping, infant recently given 10 mls of 20 kcal formula, LC discussed pace feeding infant. LC set MOB up with DEBP using 18 mm breast flange, MOB was pumping as LC left the room. MOB understands that her EBM is safe for 4 hours at room temperature whereas formula once open must be used within 1 hour. MOB plans to offer infant any EBM first and then formula. MOB was given handout "Feeding Guidelines" MOB knows to offer 5-15 mls per feeding on day 1 of EBM/ formula. MOB will continue to pump every 3 hours for 15 minutes on initial setting. MOB will continue to feed infant by cues, on demand, 8+ times within 24 hours. LC discussed hunger cues and signs of satiety with family. MOB knows to call if she has any breastfeeding questions or concerns. LC discussed importance of maternal rest, meals and hydration. MOB was made aware of O/P services, breastfeeding support groups, community resources, and our phone # for post-discharge questions.   Maternal Data Does the patient have breastfeeding experience prior to this delivery?: No  Feeding Mother's Current Feeding Choice: Breast Milk and Formula Nipple Type: Regular  LATCH Score  MOB feeding choice is exclusively pumping and formula feeding infant.                   Lactation Tools Discussed/Used Tools: Flanges;Pump Flange Size: 18 Breast pump type: Double-Electric Breast Pump Pump Education: Setup, frequency, and cleaning;Milk Storage Reason for Pumping: MOB feeding choice is exclusively pumpng and formula feeding infant. Pumping frequency: Pumping every 3 hours for 15  minutes.  Interventions Interventions: Breast feeding basics reviewed;Expressed milk;DEBP;Education;Pace feeding;Guidelines for Milk Supply and Pumping Schedule Handout;LC Services brochure;CDC milk storage guidelines;CDC Guidelines for Breast Pump Cleaning  Discharge Pump: DEBP;Personal  Consult Status Consult Status: Follow-up Date: 04/27/24 Follow-up type: In-patient    Pecolia Bourbon 04/26/2024, 4:58 PM

## 2024-04-26 NOTE — Anesthesia Preprocedure Evaluation (Signed)
 Anesthesia Evaluation  Patient identified by MRN, date of birth, ID band Patient awake    Reviewed: Allergy & Precautions, Patient's Chart, lab work & pertinent test results  History of Anesthesia Complications Negative for: history of anesthetic complications  Airway Mallampati: II  TM Distance: >3 FB Neck ROM: Full    Dental no notable dental hx.    Pulmonary neg pulmonary ROS   Pulmonary exam normal        Cardiovascular negative cardio ROS Normal cardiovascular exam     Neuro/Psych  Headaches  Anxiety Depression       GI/Hepatic Neg liver ROS,GERD  ,,  Endo/Other  Hypothyroidism    Renal/GU negative Renal ROS  negative genitourinary   Musculoskeletal negative musculoskeletal ROS (+)    Abdominal   Peds  Hematology negative hematology ROS (+)   Anesthesia Other Findings Day of surgery medications reviewed with patient.  Reproductive/Obstetrics (+) Pregnancy                              Anesthesia Physical Anesthesia Plan  ASA: 2  Anesthesia Plan: Epidural   Post-op Pain Management:    Induction:   PONV Risk Score and Plan: Treatment may vary due to age or medical condition  Airway Management Planned: Natural Airway  Additional Equipment: Fetal Monitoring  Intra-op Plan:   Post-operative Plan:   Informed Consent: I have reviewed the patients History and Physical, chart, labs and discussed the procedure including the risks, benefits and alternatives for the proposed anesthesia with the patient or authorized representative who has indicated his/her understanding and acceptance.       Plan Discussed with:   Anesthesia Plan Comments:          Anesthesia Quick Evaluation

## 2024-04-26 NOTE — Progress Notes (Signed)
 ELVER VANFLEET is a 28 y.o. G1P0 at [redacted]w[redacted]d   Subjective: Contractions becoming more painful  Objective: BP 104/66   Pulse 62   Temp 98.1 F (36.7 C) (Oral)   Resp 17   Ht 5\' 9"  (1.753 m)   Wt 85.7 kg   LMP 07/20/2023   SpO2 99%   BMI 27.91 kg/m  No intake/output data recorded. No intake/output data recorded.  FHT:  FHR: 120 bpm, variability: moderate,  accelerations:  Present,  decelerations:  Present occasional variable UC:   q2-3 by palpation SVE:   Dilation: 2 Effacement (%): 80 Station: -2 Exam by:: Dr. Felipe Horton  Assessment / Plan: 28 yo G1P0 @ 40.1 IOL for PROM  IOL: Second cytotec recently given Fetal Wellbeing:  Category II Pain Control:  IV pain meds Anticipated MOD:  NSVD  Leanne Pronto, DO 04/26/2024, 5:25 AM

## 2024-04-26 NOTE — Anesthesia Procedure Notes (Signed)
 Epidural Patient location during procedure: OB Start time: 04/26/2024 8:32 AM End time: 04/26/2024 8:35 AM  Staffing Anesthesiologist: Vernadine Golas, MD Performed: anesthesiologist   Preanesthetic Checklist Completed: patient identified, IV checked, risks and benefits discussed, monitors and equipment checked, pre-op evaluation and timeout performed  Epidural Patient position: sitting Prep: DuraPrep and site prepped and draped Patient monitoring: continuous pulse ox, blood pressure and heart rate Approach: midline Location: L3-L4 Injection technique: LOR air  Needle:  Needle type: Tuohy  Needle gauge: 17 G Needle length: 9 cm Needle insertion depth: 6 cm Catheter type: closed end flexible Catheter size: 19 Gauge Catheter at skin depth: 11 cm Test dose: negative and Other (1% lidocaine )  Assessment Events: blood not aspirated, no cerebrospinal fluid, injection not painful, no injection resistance, no paresthesia and negative IV test  Additional Notes Patient identified. Risks, benefits, and alternatives discussed with patient including but not limited to bleeding, infection, nerve damage, paralysis, failed block, incomplete pain control, headache, blood pressure changes, nausea, vomiting, reactions to medication, itching, and postpartum back pain. Confirmed with bedside nurse the patient's most recent platelet count. Confirmed with patient that they are not currently taking any anticoagulation, have any bleeding history, or any family history of bleeding disorders. Patient expressed understanding and wished to proceed. All questions were answered. Sterile technique was used throughout the entire procedure. Please see nursing notes for vital signs.   Crisp LOR on first pass. Test dose was given through epidural catheter and negative prior to continuing to dose epidural or start infusion. Warning signs of high block given to the patient including shortness of breath,  tingling/numbness in hands, complete motor block, or any concerning symptoms with instructions to call for help. Patient was given instructions on fall risk and not to get out of bed. All questions and concerns addressed with instructions to call with any issues or inadequate analgesia.  Reason for block:procedure for pain

## 2024-04-26 NOTE — Research (Signed)
 Pt was assigned to [facility] Room 413 accidentally clicked on patient chart while using the scrolling button on the mouse and accidentally pushed the button and opened up the chart.

## 2024-04-26 NOTE — Progress Notes (Signed)
 Victoria Carson is a 28 y.o. G1P0 at [redacted]w[redacted]d   Subjective: Resting comfortably s/p epidural placement  Objective: BP 111/61   Pulse 74   Temp (!) 97.5 F (36.4 C) (Axillary)   Resp 20   Ht 5\' 9"  (1.753 m)   Wt 85.7 kg   LMP 07/20/2023   SpO2 99%   BMI 27.91 kg/m  No intake/output data recorded. No intake/output data recorded.  FHT:  FHR: 120-130 bpm, variability: moderate,  accelerations:  Present,  decelerations:  present: early, occasional variable UC:  q2-5 min SVE:   Dilation: 6 Effacement (%): 80 Station: -1 Exam by:: Margarito Shearing, RN  Assessment / Plan: 28 yo G1P0 @ 40.1 IOL for PROM  IOL: s/p cytotec x2. Titrating pitocin. Now in active labor per RN check Fetal Wellbeing:  Category 2 for occasional variables, RN performing position changes Pain Control:  comfortable with epidural Anticipated MOD:  NSVD  Alene Ana, MD 04/26/2024, 11:39 AM

## 2024-04-26 NOTE — Progress Notes (Signed)
 Victoria Carson is a 28 y.o. G1P0 at 108w1d   Subjective: Doing well, would like an epidural before starting pitocin  Objective: BP 116/86   Pulse 71   Temp 97.7 F (36.5 C) (Oral)   Resp 20   Ht 5\' 9"  (1.753 m)   Wt 85.7 kg   LMP 07/20/2023   SpO2 100%   BMI 27.91 kg/m  No intake/output data recorded. No intake/output data recorded.  FHT:  FHR: 120 bpm, variability: moderate,  accelerations:  Present,  decelerations:  absent UC:  q2-5 min SVE:   Dilation: 4 Effacement (%): 80 Station: -1 Exam by:: Akiko Schexnider, MD  Assessment / Plan: 28 yo G1P0 @ 40.1 IOL for PROM  IOL: s/p cytotec x2, contractions spacing out, will plan epidural and then pitocin. Fetal Wellbeing:  Category 1 Pain Control:  desires epidural Anticipated MOD:  NSVD  Alene Ana, MD 04/26/2024, 8:44 AM

## 2024-04-27 ENCOUNTER — Other Ambulatory Visit: Payer: Self-pay

## 2024-04-27 LAB — CBC
HCT: 30.8 % — ABNORMAL LOW (ref 36.0–46.0)
Hemoglobin: 10.1 g/dL — ABNORMAL LOW (ref 12.0–15.0)
MCH: 28 pg (ref 26.0–34.0)
MCHC: 32.8 g/dL (ref 30.0–36.0)
MCV: 85.3 fL (ref 80.0–100.0)
Platelets: 180 10*3/uL (ref 150–400)
RBC: 3.61 MIL/uL — ABNORMAL LOW (ref 3.87–5.11)
RDW: 13.7 % (ref 11.5–15.5)
WBC: 9.4 10*3/uL (ref 4.0–10.5)
nRBC: 0 % (ref 0.0–0.2)

## 2024-04-27 MED ORDER — IBUPROFEN 100 MG/5ML PO SUSP
600.0000 mg | Freq: Four times a day (QID) | ORAL | Status: DC
  Administered 2024-04-27 – 2024-04-28 (×4): 600 mg via ORAL
  Filled 2024-04-27 (×5): qty 30

## 2024-04-27 MED ORDER — ACETAMINOPHEN 160 MG/5ML PO SOLN: 650.0000 mg | ORAL | Status: DC | PRN

## 2024-04-27 NOTE — Plan of Care (Signed)
  Problem: Health Behavior/Discharge Planning: Goal: Ability to manage health-related needs will improve Outcome: Progressing   Problem: Coping: Goal: Level of anxiety will decrease Outcome: Progressing   Problem: Education: Goal: Knowledge of Childbirth will improve Outcome: Progressing Goal: Ability to make informed decisions regarding treatment and plan of care will improve Outcome: Progressing Goal: Ability to state and carry out methods to decrease the pain will improve Outcome: Progressing Goal: Individualized Educational Video(s) Outcome: Progressing   Problem: Coping: Goal: Ability to verbalize concerns and feelings about labor and delivery will improve Outcome: Progressing   Problem: Life Cycle: Goal: Ability to make normal progression through stages of labor will improve Outcome: Progressing Goal: Ability to effectively push during vaginal delivery will improve Outcome: Progressing   Problem: Role Relationship: Goal: Will demonstrate positive interactions with the child Outcome: Progressing   Problem: Safety: Goal: Risk of complications during labor and delivery will decrease Outcome: Progressing   Problem: Pain Management: Goal: Relief or control of pain from uterine contractions will improve Outcome: Progressing   Problem: Education: Goal: Individualized Educational Video(s) Outcome: Progressing Goal: Individualized Newborn Educational Video(s) Outcome: Progressing   Problem: Coping: Goal: Ability to identify and utilize available resources and services will improve Outcome: Progressing   Problem: Coping: Goal: Ability to identify and utilize available resources and services will improve Outcome: Progressing

## 2024-04-27 NOTE — Anesthesia Postprocedure Evaluation (Signed)
 Anesthesia Post Note  Patient: Victoria Carson  Procedure(s) Performed: AN AD HOC LABOR EPIDURAL     Patient location during evaluation: Mother Baby Anesthesia Type: Epidural Level of consciousness: awake and alert Pain management: pain level controlled Vital Signs Assessment: post-procedure vital signs reviewed and stable Respiratory status: spontaneous breathing, nonlabored ventilation and respiratory function stable Cardiovascular status: stable Postop Assessment: no headache, no backache and epidural receding Anesthetic complications: no   No notable events documented.  Last Vitals:  Vitals:   04/26/24 2345 04/27/24 0513  BP: 122/69 116/81  Pulse: 76 76  Resp: 18 18  Temp: 36.7 C 36.8 C  SpO2: 98%     Last Pain:  Vitals:   04/27/24 0513  TempSrc: Oral  PainSc: 2    Pain Goal:                   Victoria Carson

## 2024-04-27 NOTE — Lactation Note (Signed)
 This note was copied from a baby's chart. Lactation Consultation Note  Patient Name: Victoria Carson Today's Date: 04/27/2024 Age:27 hours  Mob originally was "pumping only and formula feeding " infant. Per MOB, she has decided she wants to solely formula feeding infant. When pumping she was seeing 5 mls but she does not want to continue with exclusively pumping. Infant is now  consuming 25 to 30 mls or more of formula per feeding on Day 2 of life. MOB is pace feeding infant and will continue to feed infant, 8+ times within 24 hours. LC discussed engorgement prevention and treatment with MOB. MOB knows she can call if she has questions or concerns MOB has handout with Breastfeeding Resources: LC hotline, LC breastfeeding support group and [facility] outpatient clinic. LC will complete MOB today in flow sheet.    Maternal Data    Feeding    LATCH Score                    Lactation Tools Discussed/Used    Interventions    Discharge    Consult Status      Victoria Carson 04/27/2024, 7:50 PM

## 2024-04-27 NOTE — Plan of Care (Signed)
   Problem: Education: Goal: Knowledge of General Education information will improve Description: Including pain rating scale, medication(s)/side effects and non-pharmacologic comfort measures Outcome: Completed/Met

## 2024-04-27 NOTE — Progress Notes (Signed)
 Patient is doing well.  She is ambulating, voiding, tolerating PO.  Pain control is good.  Lochia is appropriate  Vitals:   04/26/24 2022 04/26/24 2136 04/26/24 2345 04/27/24 0513  BP: 116/73 123/67 122/69 116/81  Pulse: 62 60 76 76  Resp: 18 18 18 18   Temp: 98.3 F (36.8 C) 98 F (36.7 C) 98 F (36.7 C) 98.2 F (36.8 C)  TempSrc: Oral Oral Oral Oral  SpO2: 99% 98% 98%   Weight:      Height:        NAD Fundus firm Ext: no edema  Lab Results  Component Value Date   WBC 9.4 04/27/2024   HGB 10.1 (L) 04/27/2024   HCT 30.8 (L) 04/27/2024   MCV 85.3 04/27/2024   PLT 180 04/27/2024    --/--/O POS (05/15 2140)  A/P 28 y.o. G1P1001 PPD# s/p TSVD. Routine postpartum care.   Expect d/c tomorrow.    [facility] [employer]EL [employer]

## 2024-04-27 NOTE — Progress Notes (Signed)
 MOB was referred for history of depression/anxiety. * Referral screened out by Clinical Social Worker because none of the following criteria appear to apply: ~ History of anxiety/depression during this pregnancy, or of post-partum depression following prior delivery. Per prenatal records reviews, no concerns noted.  ~ Diagnosis of anxiety and/or depression within last 3 years. Per chart review, MOB's anxiety/depression dates back to 2014.  OR * MOB's symptoms currently being treated with medication and/or therapy.  Please contact the Clinical Social Worker if needs arise, by [facility] request, or if MOB scores greater than 9/yes to question 10 on Edinburgh Postpartum Depression Screen.  Bobbye Burrow, LCSW Clinical Social Worker [facility] Cell#: [phone]

## 2024-04-28 LAB — BIRTH TISSUE RECOVERY COLLECTION (PLACENTA DONATION)

## 2024-04-28 NOTE — Discharge Summary (Signed)
 Postpartum Discharge Summary  Date of Service updated 04/28/24     Patient Name: Victoria Carson DOB: 05-12-1996 MRN: [id number]  Date of admission: 04/25/2024 Delivery date:04/26/2024 Delivering provider: Alroy Jericho A Date of discharge: 04/28/2024  Admitting diagnosis: PROM (premature rupture of membranes) [O42.90] Intrauterine pregnancy: [redacted]w[redacted]d     Secondary diagnosis:  Principal Problem:   PROM (premature rupture of membranes)  Additional problems: None    Discharge diagnosis: Term Pregnancy Delivered                                              Post partum procedures:n/a Augmentation: Pitocin  and Cytotec  Complications: None  Hospital course: Induction of Labor With Vaginal Delivery   28 y.o. yo G1P1001 at [redacted]w[redacted]d was admitted to the hospital 04/25/2024 for induction of labor.  Indication for induction: PROM.  Patient had an labor course complicated by rapid progression from 6 to 10 cm resulting in prolonged fetal heart rate deceleration necessitating VAVD  Membrane Rupture Time/Date: 7:50 PM,04/25/2024  Delivery Method:Vaginal, Vacuum (Extractor) Operative Delivery:Device used:Kiwi Indication: Fetal indications Episiotomy: None Lacerations:  1st degree;Perineal Details of delivery can be found in separate delivery note.  Patient had a postpartum course complicated by n/a. Patient is discharged home 04/28/24.  Newborn Data: Birth date:04/26/2024 Birth time:12:27 PM Gender:Female Living status:Living Apgars:8 ,9  Weight:3100 g  Magnesium Sulfate received: No BMZ received: No Rhophylac:No MMR:N/A T-DaP:Given prenatally Flu: No RSV Vaccine received: No Transfusion:No Immunizations administered: Immunization History  Administered Date(s) Administered   DTaP 07/03/1997, 09/02/1997, 11/11/1997, 07/30/1998   HIB (PRP-OMP) 07/03/1997, 09/02/1997, 11/11/1997, 04/28/1998   HPV Quadrivalent 11/19/2013, 01/21/2014, 05/22/2014   Hepatitis B June [birth year], 1997, 07/03/1997,  11/11/1997   IPV 07/03/1997, 09/02/1997, 04/28/1998   MMR 07/30/1998   PFIZER(Purple Top)SARS-COV-2 Vaccination 03/21/2020, 04/18/2020   Td 01/10/2018   Tdap 08/25/2008, 02/06/2024   Varicella 10/19/1998    Physical exam  Vitals:   04/27/24 0513 04/27/24 1346 04/27/24 2128 04/28/24 0624  BP: 116/81 121/66 121/67 116/79  Pulse: 76 77 72 64  Resp: 18 18 18 18   Temp: 98.2 F (36.8 C) 98.3 F (36.8 C) 98.3 F (36.8 C) 98.2 F (36.8 C)  TempSrc: Oral Oral Oral Oral  SpO2:  99% 97% 97%  Weight:      Height:       General: alert, cooperative, and no distress Lochia: appropriate Uterine Fundus: firm Incision: N/A DVT Evaluation: No evidence of DVT seen on physical exam. Labs: Lab Results  Component Value Date   WBC 9.4 04/27/2024   HGB 10.1 (L) 04/27/2024   HCT 30.8 (L) 04/27/2024   MCV 85.3 04/27/2024   PLT 180 04/27/2024      Latest Ref Rng & Units 01/26/2024    8:41 PM  CMP  Glucose 70 - 99 mg/dL 84   BUN 6 - 20 mg/dL 8   Creatinine 5.28 - 4.13 mg/dL 2.44   Sodium 010 - 272 mmol/L 135   Potassium 3.5 - 5.1 mmol/L 3.8   Chloride 98 - 111 mmol/L 103   CO2 22 - 32 mmol/L 22   Calcium  8.9 - 10.3 mg/dL 8.5   Total Protein 6.5 - 8.1 g/dL 5.4   Total Bilirubin 0.0 - 1.2 mg/dL 0.3   Alkaline Phos 38 - 126 U/L 61   AST 15 - 41 U/L 13   ALT 0 - 44 U/L  12    Edinburgh Score:    04/26/2024   11:42 PM  Edinburgh Postnatal Depression Scale Screening Tool  I have been able to laugh and see the funny side of things. 0  I have looked forward with enjoyment to things. 0  I have blamed myself unnecessarily when things went wrong. 0  I have been anxious or worried for no good reason. 2  I have felt scared or panicky for no good reason. 1  Things have been getting on top of me. 0  I have been so unhappy that I have had difficulty sleeping. 0  I have felt sad or miserable. 0  I have been so unhappy that I have been crying. 0  The thought of harming myself has occurred to me.  0  Edinburgh Postnatal Depression Scale Total 3      After visit meds:  Allergies as of 04/28/2024       Reactions   Citrus Itching, Other (See Comments)   SWEATING   Sulfa  Antibiotics Nausea Only, Other (See Comments), Nausea And Vomiting   FEVER        Medication List     STOP taking these medications    ondansetron  4 MG disintegrating tablet Commonly known as: ZOFRAN -ODT   promethazine  25 MG tablet Commonly known as: PHENERGAN    pyridOXINE 25 MG tablet Commonly known as: VITAMIN B6   Vitamin D3 25 MCG Caps       TAKE these medications    EPINEPHrine  0.3 mg/0.3 mL Soaj injection Commonly known as: EPI-PEN Inject 0.3 mg into the muscle as needed for anaphylaxis.   prenatal multivitamin Tabs tablet Take 1 tablet by mouth daily at 12 noon.         Discharge home in stable condition Infant Feeding: Breast Infant Disposition:home with mother Discharge instruction: per After Visit Summary and Postpartum booklet. Activity: Advance as tolerated. Pelvic rest for 6 weeks.  Diet: routine diet Anticipated Birth Control: Unsure Postpartum Appointment:4 weeks Additional Postpartum F/U: n/a Future Appointments:No future appointments. Follow up Visit:  Follow-up Information     Alroy Jericho A, MD Follow up in 4 week(s).   Specialty: Obstetrics and Gynecology Contact information: [address] [place] [address] [phone]                     04/28/2024 [facility] Belinda Bowler, MD

## 2024-04-28 NOTE — Discharge Instructions (Signed)

## 2024-04-28 NOTE — Progress Notes (Signed)
 Patient is doing well.  She is ambulating, voiding, tolerating PO.  Pain control is good.  Lochia is appropriate  Vitals:   04/27/24 0513 04/27/24 1346 04/27/24 2128 04/28/24 0624  BP: 116/81 121/66 121/67 116/79  Pulse: 76 77 72 64  Resp: 18 18 18 18   Temp: 98.2 F (36.8 C) 98.3 F (36.8 C) 98.3 F (36.8 C) 98.2 F (36.8 C)  TempSrc: Oral Oral Oral Oral  SpO2:  99% 97% 97%  Weight:      Height:        NAD Fundus firm Ext: no edema  Lab Results  Component Value Date   WBC 9.4 04/27/2024   HGB 10.1 (L) 04/27/2024   HCT 30.8 (L) 04/27/2024   MCV 85.3 04/27/2024   PLT 180 04/27/2024    --/--/O POS (05/15 2140)  A/P 28 y.o. G1P1001 PPD#2 s/p TSVD. Routine postpartum care.   Meeting all goals.  Discharge to home today.   [facility] [employer]

## 2024-04-28 NOTE — Plan of Care (Signed)
  Problem: Health Behavior/Discharge Planning: Goal: Ability to manage health-related needs will improve Outcome: Adequate for Discharge   Problem: Coping: Goal: Level of anxiety will decrease Outcome: Adequate for Discharge   Problem: Education: Goal: Knowledge of Childbirth will improve Outcome: Adequate for Discharge Goal: Ability to make informed decisions regarding treatment and plan of care will improve Outcome: Adequate for Discharge Goal: Ability to state and carry out methods to decrease the pain will improve Outcome: Adequate for Discharge Goal: Individualized Educational Video(s) Outcome: Adequate for Discharge   Problem: Coping: Goal: Ability to verbalize concerns and feelings about labor and delivery will improve Outcome: Adequate for Discharge   Problem: Life Cycle: Goal: Ability to make normal progression through stages of labor will improve Outcome: Adequate for Discharge Goal: Ability to effectively push during vaginal delivery will improve Outcome: Adequate for Discharge   Problem: Role Relationship: Goal: Will demonstrate positive interactions with the child Outcome: Adequate for Discharge   Problem: Safety: Goal: Risk of complications during labor and delivery will decrease Outcome: Adequate for Discharge   Problem: Pain Management: Goal: Relief or control of pain from uterine contractions will improve Outcome: Adequate for Discharge   Problem: Education: Goal: Individualized Educational Video(s) Outcome: Adequate for Discharge Goal: Individualized Newborn Educational Video(s) Outcome: Adequate for Discharge   Problem: Coping: Goal: Ability to identify and utilize available resources and services will improve Outcome: Adequate for Discharge   Problem: Coping: Goal: Ability to identify and utilize available resources and services will improve Outcome: Adequate for Discharge

## 2024-05-02 ENCOUNTER — Inpatient Hospital Stay (HOSPITAL_COMMUNITY)

## 2024-05-02 ENCOUNTER — Inpatient Hospital Stay (HOSPITAL_COMMUNITY): Admission: RE | Admit: 2024-05-02 | Source: Home / Self Care | Admitting: Obstetrics

## 2024-05-07 ENCOUNTER — Telehealth

## 2024-05-08 ENCOUNTER — Telehealth (HOSPITAL_COMMUNITY): Payer: Self-pay | Admitting: *Deleted

## 2024-05-08 NOTE — Telephone Encounter (Signed)
 Attempted hospital discharge follow-up call. Left message for patient to return RN call with any questions or concerns. Julien Odor, RN, 05/08/24, [phone]

## 2024-05-14 ENCOUNTER — Encounter: Payer: Self-pay | Admitting: Physician Assistant

## 2024-10-10 ENCOUNTER — Telehealth: Payer: Self-pay | Admitting: Physician Assistant

## 2024-10-10 NOTE — Telephone Encounter (Signed)
 LVM for pt to call back and follow up with symptoms to see if pt needs triage.

## 2024-10-11 ENCOUNTER — Ambulatory Visit: Admitting: Physician Assistant

## 2024-10-14 NOTE — Telephone Encounter (Signed)
 LVM AGAIN ON 10/14/24

## 2024-10-22 ENCOUNTER — Encounter: Payer: Self-pay | Admitting: Physician Assistant

## 2024-10-22 ENCOUNTER — Ambulatory Visit: Admitting: Physician Assistant

## 2024-10-31 ENCOUNTER — Emergency Department (HOSPITAL_BASED_OUTPATIENT_CLINIC_OR_DEPARTMENT_OTHER)
Admission: EM | Admit: 2024-10-31 | Discharge: 2024-10-31 | Discharge status: Against Medical Advice | Attending: Emergency Medicine | Admitting: Emergency Medicine

## 2024-10-31 ENCOUNTER — Other Ambulatory Visit: Payer: Self-pay

## 2024-10-31 DIAGNOSIS — Z5321 Procedure and treatment not carried out due to patient leaving prior to being seen by health care provider: Secondary | ICD-10-CM | POA: Insufficient documentation

## 2024-10-31 DIAGNOSIS — R197 Diarrhea, unspecified: Secondary | ICD-10-CM | POA: Insufficient documentation

## 2024-10-31 DIAGNOSIS — R112 Nausea with vomiting, unspecified: Secondary | ICD-10-CM | POA: Diagnosis not present

## 2024-10-31 DIAGNOSIS — R1084 Generalized abdominal pain: Secondary | ICD-10-CM | POA: Insufficient documentation

## 2024-10-31 LAB — CBC
HCT: 41.5 % (ref 36.0–46.0)
Hemoglobin: 13.9 g/dL (ref 12.0–15.0)
MCH: 29.6 pg (ref 26.0–34.0)
MCHC: 33.5 g/dL (ref 30.0–36.0)
MCV: 88.5 fL (ref 80.0–100.0)
Platelets: 248 K/uL (ref 150–400)
RBC: 4.69 MIL/uL (ref 3.87–5.11)
RDW: 13.3 % (ref 11.5–15.5)
WBC: 9.6 K/uL (ref 4.0–10.5)
nRBC: 0 % (ref 0.0–0.2)

## 2024-10-31 LAB — COMPREHENSIVE METABOLIC PANEL WITH GFR
ALT: 11 U/L (ref 0–44)
AST: 14 U/L — ABNORMAL LOW (ref 15–41)
Albumin: 4.4 g/dL (ref 3.5–5.0)
Alkaline Phosphatase: 119 U/L (ref 38–126)
Anion gap: 11 (ref 5–15)
BUN: 12 mg/dL (ref 6–20)
CO2: 25 mmol/L (ref 22–32)
Calcium: 9.6 mg/dL (ref 8.9–10.3)
Chloride: 103 mmol/L (ref 98–111)
Creatinine, Ser: 0.75 mg/dL (ref 0.44–1.00)
GFR, Estimated: >60 mL/min (ref >60)
Glucose, Bld: 102 mg/dL — ABNORMAL HIGH (ref 70–99)
Potassium: 4 mmol/L (ref 3.5–5.1)
Sodium: 139 mmol/L (ref 135–145)
Total Bilirubin: 0.7 mg/dL (ref 0.0–1.2)
Total Protein: 7.6 g/dL (ref 6.5–8.1)

## 2024-10-31 LAB — LIPASE, BLOOD: Lipase: 18 U/L (ref 11–51)

## 2024-10-31 NOTE — ED Notes (Signed)
 Called to place in room  no answer

## 2024-10-31 NOTE — ED Triage Notes (Signed)
 Reports generalized abd pain w/ n/v/d starting around 0300 this morning. Denies SHOB.
